# Patient Record
Sex: Male | Born: 1955 | Race: White | Hispanic: No | Smoking: Never smoker
Health system: Southern US, Community
[De-identification: ages and names within clinical notes are randomized; demographics above are authoritative.]

## PROBLEM LIST (undated history)

## (undated) DIAGNOSIS — F064 Anxiety disorder due to known physiological condition: Secondary | ICD-10-CM

## (undated) DIAGNOSIS — F5102 Adjustment insomnia: Secondary | ICD-10-CM

## (undated) DIAGNOSIS — F0630 Mood disorder due to known physiological condition, unspecified: Secondary | ICD-10-CM

## (undated) DIAGNOSIS — F0390 Unspecified dementia without behavioral disturbance: Secondary | ICD-10-CM

## (undated) DIAGNOSIS — F339 Major depressive disorder, recurrent, unspecified: Secondary | ICD-10-CM

## (undated) DIAGNOSIS — I6203 Nontraumatic chronic subdural hemorrhage: Secondary | ICD-10-CM

## (undated) DIAGNOSIS — G3109 Other frontotemporal dementia: Secondary | ICD-10-CM

## (undated) DIAGNOSIS — F0280 Dementia in other diseases classified elsewhere without behavioral disturbance: Secondary | ICD-10-CM

## (undated) DIAGNOSIS — I10 Essential (primary) hypertension: Secondary | ICD-10-CM

## (undated) HISTORY — DX: Essential (primary) hypertension: I10

---

## 2018-03-12 ENCOUNTER — Other Ambulatory Visit: Payer: Self-pay | Admitting: Internal Medicine

## 2018-03-12 DIAGNOSIS — R413 Other amnesia: Secondary | ICD-10-CM

## 2018-03-15 ENCOUNTER — Ambulatory Visit
Admission: RE | Admit: 2018-03-15 | Discharge: 2018-03-15 | Disposition: A | Discharge status: Home / Self Care | Payer: 59 | Source: Ambulatory Visit | Attending: Internal Medicine | Admitting: Internal Medicine

## 2018-03-15 DIAGNOSIS — R413 Other amnesia: Secondary | ICD-10-CM

## 2018-03-15 MED ORDER — GADOBENATE DIMEGLUMINE 529 MG/ML IV SOLN
20.0000 mL | Freq: Once | INTRAVENOUS | Status: AC | PRN
  Administered 2018-03-15: 20 mL via INTRAVENOUS

## 2018-03-18 ENCOUNTER — Encounter: Payer: Self-pay | Admitting: Psychology

## 2018-09-08 ENCOUNTER — Encounter

## 2018-09-08 ENCOUNTER — Encounter: Payer: 59 | Admitting: Psychology

## 2018-09-15 ENCOUNTER — Encounter: Payer: Self-pay | Admitting: Neurology

## 2018-10-08 ENCOUNTER — Encounter: Payer: 59 | Admitting: Psychology

## 2018-11-30 ENCOUNTER — Other Ambulatory Visit: Payer: Self-pay

## 2018-11-30 ENCOUNTER — Other Ambulatory Visit (INDEPENDENT_AMBULATORY_CARE_PROVIDER_SITE_OTHER): Payer: 59

## 2018-11-30 ENCOUNTER — Encounter: Payer: Self-pay | Admitting: Neurology

## 2018-11-30 ENCOUNTER — Ambulatory Visit (INDEPENDENT_AMBULATORY_CARE_PROVIDER_SITE_OTHER): Payer: 59 | Admitting: Neurology

## 2018-11-30 ENCOUNTER — Ambulatory Visit: Payer: 59 | Admitting: Neurology

## 2018-11-30 VITALS — BP 118/84 | HR 64 | Ht 74.0 in | Wt 189.0 lb

## 2018-11-30 DIAGNOSIS — G3109 Other frontotemporal dementia: Secondary | ICD-10-CM

## 2018-11-30 DIAGNOSIS — F0280 Dementia in other diseases classified elsewhere without behavioral disturbance: Secondary | ICD-10-CM

## 2018-11-30 MED ORDER — ESCITALOPRAM OXALATE 10 MG PO TABS: 10.0000 mg | ORAL_TABLET | Freq: Every day | ORAL | 11 refills | Status: DC

## 2018-11-30 NOTE — Patient Instructions (Addendum)
1. Bloodwork for TSH, RPR, ammonia, ESR, CRP, ANA, anti-thyroglobulin antibodies, anti-thyroid peroxidase antibodies  Your provider requests that you have LABS drawn today.  We share a lab with [facility] - they are located in [address] (second floor) of this building.  Once you get there, please have a seat and the phlebotomist will call your name.  If you have waited more than 15 minutes, please advise the front desk   2. Schedule lumbar puncture under fluoroscopy  We have sent a referral to [facility] for your LP Puncture and they will call you directly to schedule your appt. They are located at [place]. If you need to contact them directly please call [phone].   3. Start Lexapro 51m daily  4. Please have a driving evaluation done. Contact [employer] [place] [phone]  5. Follow-up in 6 months, call for any changes  FALL PRECAUTIONS: Be cautious when walking. Scan the area for obstacles that may increase the risk of trips and falls. When getting up in the mornings, sit up at the edge of the bed for a few minutes before getting out of bed. Consider elevating the bed at the head end to avoid drop of blood pressure when getting up. Walk always in a well-lit room (use night lights in the walls). Avoid area rugs or power cords from appliances in the middle of the walkways. Use a walker or a cane if necessary and consider physical therapy for balance exercise. Get your eyesight checked regularly.  FINANCIAL OVERSIGHT: Supervision, especially oversight when making financial decisions or transactions is also recommended.  HOME SAFETY: Consider the safety of the kitchen when operating appliances like stoves, microwave oven, and blender. Consider having supervision and share cooking responsibilities until no longer able to participate in those. Accidents with firearms and other hazards in the  house should be identified and addressed as well.  DRIVING: Regarding driving, in patients with progressive memory problems, driving will be impaired. We advise to have someone else do the driving if trouble finding directions or if minor accidents are reported. Independent driving assessment is available to determine safety of driving.  ABILITY TO BE LEFT ALONE: If patient is unable to contact 911 operator, consider using LifeLine, or when the need is there, arrange for someone to stay with patients. Smoking is a fire hazard, consider supervision or cessation. Risk of wandering should be assessed by caregiver and if detected at any point, supervision and safe proof recommendations should be instituted.  MEDICATION SUPERVISION: Inability to self-administer medication needs to be constantly addressed. Implement a mechanism to ensure safe administration of the medications.  RECOMMENDATIONS FOR ALL PATIENTS WITH MEMORY PROBLEMS: 1. Continue to exercise (Recommend 30 minutes of walking everyday, or 3 hours every week) 2. Increase social interactions - continue going to [place] enjoy social gatherings with friends and family 3. Eat healthy, avoid fried foods and eat more fruits and vegetables 4. Maintain adequate blood pressure, blood sugar, and blood cholesterol level. Reducing the risk of stroke and cardiovascular disease also helps promoting better memory. 5. Avoid stressful situations. Live a simple life and avoid aggravations. Organize your time and prepare for the next day in anticipation. 6. Sleep well, avoid any interruptions of sleep and avoid any distractions in the bedroom that may interfere with adequate sleep quality 7. Avoid sugar, avoid sweets as there is a strong link between excessive sugar intake, diabetes, and cognitive impairment The Mediterranean  diet has been shown to help patients reduce the risk of progressive memory disorders and reduces cardiovascular risk. This includes  eating fish, eat fruits and green leafy vegetables, nuts like almonds and hazelnuts, walnuts, and also use olive oil. Avoid fast foods and fried foods as much as possible. Avoid sweets and sugar as sugar use has been linked to worsening of memory function.  There is always a concern of gradual progression of memory problems. If this is the case, then we may need to adjust level of care according to patient needs. Support, both to the patient and caregiver, should then be put into place.
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
## 2018-12-01 LAB — THYROID PEROXIDASE ANTIBODY: Thyroperoxidase Ab SerPl-aCnc: <1 IU/mL (ref <9)

## 2018-12-01 LAB — THYROGLOBULIN LEVEL: Thyroglobulin: 11.5 ng/mL

## 2018-12-01 LAB — C-REACTIVE PROTEIN: CRP: 2 mg/L (ref <8.0)

## 2018-12-01 LAB — SEDIMENTATION RATE: Sed Rate: 2 mm/h (ref 0–20)

## 2018-12-01 LAB — RPR: RPR Ser Ql: NONREACTIVE

## 2018-12-01 LAB — ANA: Anti Nuclear Antibody(ANA): NEGATIVE

## 2018-12-01 LAB — AMMONIA: Ammonia: 36 umol/L (ref <=72)

## 2018-12-01 LAB — TSH: TSH: 1.84 mIU/L (ref 0.40–4.50)

## 2018-12-02 ENCOUNTER — Encounter: Payer: Self-pay | Admitting: Neurology

## 2018-12-09 ENCOUNTER — Telehealth: Payer: Self-pay | Admitting: Neurology

## 2018-12-09 DIAGNOSIS — G3109 Other frontotemporal dementia: Principal | ICD-10-CM

## 2018-12-09 DIAGNOSIS — F0280 Dementia in other diseases classified elsewhere without behavioral disturbance: Secondary | ICD-10-CM

## 2018-12-09 NOTE — Addendum Note (Signed)
Addended bySilvio Pate on: 12/09/2018 04:10 PM   Modules accepted: Orders

## 2018-12-09 NOTE — Telephone Encounter (Signed)
Pls order LP under fluoro:  1. CSF cell count  2. CSF protein and glucose  3. CSF gram stain and culture  4. CSF HSV PCR  5. CSF cryptococcal Ag  6. CSF fungal culture  7. Cytology  8. CSF ACE level 9. CSF oligoclonal bands, IgG synthesis

## 2018-12-09 NOTE — Telephone Encounter (Signed)
Wife is calling in about the spinal tap orders. Please call her back at [phone]. Thanks!

## 2018-12-09 NOTE — Telephone Encounter (Signed)
Documented in other encounter

## 2018-12-09 NOTE — Telephone Encounter (Signed)
-----   Message from Van Clines, MD sent at 12/02/2018  9:20 AM EST ----- Pls left wife know bloodwork was normal. Proceed with lumbar puncture as discussed. I will send you CSF studies for lab. Thanks  Patient's wife made aware.  Lumbar puncture orders entered. [facility] and they are aware. They will call patient. [facility])

## 2018-12-09 NOTE — Telephone Encounter (Signed)
I don't see that you have sent me any tests to be done.

## 2018-12-09 NOTE — Telephone Encounter (Signed)
-----   Message from Van Clines, MD sent at 12/02/2018  9:20 AM EST ----- Pls left wife know bloodwork was normal. Proceed with lumbar puncture as discussed. I will send you CSF studies for lab. Thanks

## 2018-12-21 ENCOUNTER — Other Ambulatory Visit (HOSPITAL_COMMUNITY)
Admission: RE | Admit: 2018-12-21 | Discharge: 2018-12-21 | Disposition: A | Discharge status: Home / Self Care | Payer: 59 | Source: Ambulatory Visit | Attending: Neurology | Admitting: Neurology

## 2018-12-21 ENCOUNTER — Ambulatory Visit
Admission: RE | Admit: 2018-12-21 | Discharge: 2018-12-21 | Disposition: A | Discharge status: Home / Self Care | Payer: 59 | Source: Ambulatory Visit | Attending: Neurology | Admitting: Neurology

## 2018-12-21 ENCOUNTER — Other Ambulatory Visit: Payer: Self-pay

## 2018-12-21 VITALS — BP 116/60 | HR 58

## 2018-12-21 DIAGNOSIS — G3109 Other frontotemporal dementia: Secondary | ICD-10-CM | POA: Insufficient documentation

## 2018-12-21 DIAGNOSIS — F0280 Dementia in other diseases classified elsewhere without behavioral disturbance: Secondary | ICD-10-CM | POA: Diagnosis present

## 2018-12-21 NOTE — Progress Notes (Signed)
Labs for LP obtained from pt's R AC. Pt tolerated procedure well. Site is unremarkable.

## 2018-12-21 NOTE — Discharge Instructions (Signed)

## 2018-12-22 ENCOUNTER — Other Ambulatory Visit: Payer: Self-pay | Admitting: Neurology

## 2018-12-24 ENCOUNTER — Telehealth: Payer: Self-pay | Admitting: Neurology

## 2018-12-24 DIAGNOSIS — G971 Other reaction to spinal and lumbar puncture: Secondary | ICD-10-CM

## 2018-12-24 NOTE — Telephone Encounter (Signed)
Orders placed.

## 2018-12-24 NOTE — Telephone Encounter (Signed)
Spoke with pt's wife, Tyler Aas.  She explains that pt has had headache ever since LP on Monday and is now experiencing projectile vomiting.    Called [facility] about Blood Patch, spoke with [employer].  She states that as of right now they are unsure what procedures that can and cannot preform (COVID19 Pandemic) advised for me to place order in system and she will call my direct line before the end of the day with response from supervisor.

## 2018-12-24 NOTE — Telephone Encounter (Signed)
Pt wife called and states that the patient had a lumbar puncture on Monday and since then has been dealing with headaches and not being able to keep things down. She would like to speak to someone about what she needs to do. Please call

## 2018-12-25 ENCOUNTER — Other Ambulatory Visit: Payer: Self-pay

## 2018-12-25 ENCOUNTER — Ambulatory Visit
Admission: RE | Admit: 2018-12-25 | Discharge: 2018-12-25 | Disposition: A | Discharge status: Home / Self Care | Payer: 59 | Source: Ambulatory Visit | Attending: Neurology | Admitting: Neurology

## 2018-12-25 DIAGNOSIS — G971 Other reaction to spinal and lumbar puncture: Secondary | ICD-10-CM

## 2018-12-25 MED ORDER — IOPAMIDOL (ISOVUE-M 200) INJECTION 41%
1.0000 mL | Freq: Once | INTRAMUSCULAR | Status: AC
  Administered 2018-12-25: 1 mL via EPIDURAL

## 2018-12-25 NOTE — Discharge Instructions (Signed)

## 2018-12-25 NOTE — Progress Notes (Signed)
Blood obtained for blood patch from L AC. Pt tolerated procedure well. Site is unremarkable.  

## 2019-01-01 LAB — CSF CELL COUNT WITH DIFFERENTIAL
RBC Count, CSF: 0 cells/uL (ref 0–10)
WBC, CSF: 0 cells/uL (ref 0–5)

## 2019-01-19 ENCOUNTER — Telehealth: Payer: Self-pay

## 2019-01-19 LAB — CRYPTOCOCCAL AG, LTX SCR RFLX TITER
Cryptococcal Ag Screen: NOT DETECTED
MICRO NUMBER:: 323047
SPECIMEN QUALITY:: ADEQUATE

## 2019-01-19 LAB — PROTEIN, CSF: Total Protein, CSF: 68 mg/dL — ABNORMAL HIGH (ref 15–60)

## 2019-01-19 LAB — CNS IGG SYNTHESIS RATE, CSF+BLOOD
Albumin Serum: 4.4 g/dL (ref 3.2–4.6)
Albumin, CSF: 43.2 mg/dL — ABNORMAL HIGH (ref 8.0–42.0)
CNS-IgG Synthesis Rate: -0.1 mg/24 h (ref <=3.3)
IgG (Immunoglobin G), Serum: 676 mg/dL (ref 600–1540)
IgG Total CSF: 3.4 mg/dL (ref 0.8–7.7)
IgG-Index: 0.51 (ref <0.66)

## 2019-01-19 LAB — ANGIOTENSIN CONVERTING ENZYME, CSF: ACE, CSF: 4 U/L (ref <=15)

## 2019-01-19 LAB — FUNGUS CULTURE W SMEAR
MICRO NUMBER:: 323048
SMEAR:: NONE SEEN
SPECIMEN QUALITY:: ADEQUATE

## 2019-01-19 LAB — CSF CULTURE W GRAM STAIN
MICRO NUMBER:: 323049
Result:: NO GROWTH
SPECIMEN QUALITY:: ADEQUATE

## 2019-01-19 LAB — HSV DNA BY PCR (REFERENCE LAB)
HSV 1 DNA: NOT DETECTED
HSV 2 DNA: NOT DETECTED

## 2019-01-19 LAB — OLIGOCLONAL BANDS, CSF + SERM

## 2019-01-19 LAB — CSF CULTURE

## 2019-01-19 LAB — GLUCOSE, CSF: Glucose, CSF: 61 mg/dL (ref 40–80)

## 2019-01-19 NOTE — Telephone Encounter (Signed)
-----   Message from Van Clines, MD sent at 12/31/2018 10:58 AM EDT ----- Pls let wife know all the results from his spinal tap finally came back and there is no evidence of inflammation or infection. Thanks

## 2019-01-19 NOTE — Telephone Encounter (Signed)
LMOM for pt's wife, Tyler Aas, relaying message below.

## 2019-04-07 ENCOUNTER — Telehealth: Payer: Self-pay | Admitting: Neurology

## 2019-04-07 NOTE — Telephone Encounter (Signed)
Error

## 2019-05-20 ENCOUNTER — Telehealth: Payer: Self-pay | Admitting: Neurology

## 2019-05-20 NOTE — Telephone Encounter (Signed)
Patient's wife is calling about if patient needs any labs or testing in the mean time before appt in March since that was next available. Thanks

## 2019-05-21 NOTE — Telephone Encounter (Signed)
Dr. Delice Lesch,  Does pt need any labs before his visit? He was seen 11/30/18 for dementia and you did want him to f/u in 78m. Last labs were on 12/21/18

## 2019-05-21 NOTE — Telephone Encounter (Signed)
That is too far away, he needs an earlier visit. Pls let wife know I have an opening for a virtual video visit on Aug 25 at 10am, if she can do that? Thanks

## 2019-05-21 NOTE — Telephone Encounter (Signed)
Dr. Delice Lesch,  Where do you want this pt scheduled?

## 2019-05-21 NOTE — Telephone Encounter (Signed)
Please call pt/wife and schedule virtual for [place]. Thank you.

## 2019-05-21 NOTE — Telephone Encounter (Signed)
Wife doesn't not want him doing a VV as she feels it isn't thorough enough.

## 2019-05-23 NOTE — Telephone Encounter (Signed)
I have an opening on Aug 31 at 1:30pm, thanks

## 2019-05-24 NOTE — Telephone Encounter (Signed)
Left message informing of appt date and time. Instructed to call back if date/time does not work.

## 2019-05-24 NOTE — Telephone Encounter (Signed)
error 

## 2019-06-01 ENCOUNTER — Ambulatory Visit: Payer: 59 | Admitting: Neurology

## 2019-06-07 ENCOUNTER — Ambulatory Visit: Payer: 59 | Admitting: Neurology

## 2019-06-28 ENCOUNTER — Ambulatory Visit: Payer: 59 | Admitting: Neurology

## 2019-07-12 ENCOUNTER — Ambulatory Visit: Payer: 59 | Admitting: Neurology

## 2019-12-15 ENCOUNTER — Encounter: Payer: Self-pay | Admitting: Neurology

## 2019-12-15 ENCOUNTER — Ambulatory Visit (INDEPENDENT_AMBULATORY_CARE_PROVIDER_SITE_OTHER): Payer: Self-pay | Admitting: Neurology

## 2019-12-15 ENCOUNTER — Other Ambulatory Visit: Payer: Self-pay

## 2019-12-15 VITALS — BP 138/86 | HR 67 | Ht 74.0 in | Wt 194.8 lb

## 2019-12-15 DIAGNOSIS — G3109 Other frontotemporal dementia: Secondary | ICD-10-CM

## 2019-12-15 DIAGNOSIS — F0280 Dementia in other diseases classified elsewhere without behavioral disturbance: Secondary | ICD-10-CM

## 2019-12-15 MED ORDER — ESCITALOPRAM OXALATE 20 MG PO TABS: 20.0000 mg | ORAL_TABLET | Freq: Every day | ORAL | 3 refills | Status: DC
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
## 2019-12-15 NOTE — Patient Instructions (Signed)
1. Prescription for Lexapro 20mg  daily has been sent to your pharmacy  2. Doris should give you your medications daily now  3. Follow-up in 6 months, call for any changes

## 2019-12-29 ENCOUNTER — Other Ambulatory Visit: Payer: Self-pay | Admitting: Neurology

## 2020-01-06 ENCOUNTER — Ambulatory Visit: Payer: Self-pay | Attending: Internal Medicine

## 2020-01-06 DIAGNOSIS — Z23 Encounter for immunization: Secondary | ICD-10-CM

## 2020-01-06 NOTE — Progress Notes (Signed)
   Covid-19 Vaccination Clinic  Name:  Micheal [name]    MRN: [id number] DOB: [birth year]  01/06/2020  Micheal [name] was observed post Covid-19 immunization for 15 minutes without incident. He was provided with Vaccine Information Sheet and instruction to access the V-Safe system.   Micheal [name] was instructed to call 911 with any severe reactions post vaccine: Marland Kitchen Difficulty breathing  . Swelling of face and throat  . A fast heartbeat  . A bad rash all over body  . Dizziness and weakness   Immunizations Administered    Name Date Dose VIS Date Route   Pfizer COVID-19 Vaccine 01/06/2020  8:40 AM 0.3 mL 09/17/2019 Intramuscular   Manufacturer: [employer], [employer]   Lot: [id number]   NDC: [address]-2

## 2020-01-31 ENCOUNTER — Ambulatory Visit: Payer: Self-pay | Attending: Internal Medicine

## 2020-01-31 DIAGNOSIS — Z23 Encounter for immunization: Secondary | ICD-10-CM

## 2020-01-31 NOTE — Progress Notes (Signed)
   Covid-19 Vaccination Clinic  Name:  Micheal [name]    MRN: [id number] DOB: [birth year]  01/31/2020  [name] was observed post Covid-19 immunization for 15 minutes without incident. He was provided with Vaccine Information Sheet and instruction to access the V-Safe system.   [name] was instructed to call 911 with any severe reactions post vaccine: Marland Kitchen Difficulty breathing  . Swelling of face and throat  . A fast heartbeat  . A bad rash all over body  . Dizziness and weakness   Immunizations Administered    Name Date Dose VIS Date Route   Pfizer COVID-19 Vaccine 01/31/2020  8:42 AM 0.3 mL 12/01/2018 Intramuscular   Manufacturer: [employer], [employer]   Lot: [id number]   NDC: [address]-7

## 2020-01-31 NOTE — Progress Notes (Signed)
   Covid-19 Vaccination Clinic  Name:  Micheal [name]    MRN: [id number] DOB: 26-Sep-1956  01/31/2020  [name] was observed post Covid-19 immunization for 15 minutes without incident. He was provided with Vaccine Information Sheet and instruction to access the V-Safe system.   [name] was instructed to call 911 with any severe reactions post vaccine: Marland Kitchen Difficulty breathing  . Swelling of face and throat  . A fast heartbeat  . A bad rash all over body  . Dizziness and weakness   Immunizations Administered    Name Date Dose VIS Date Route   Pfizer COVID-19 Vaccine 01/31/2020  8:42 AM 0.3 mL 12/01/2018 Intramuscular   Manufacturer: [employer], [employer]   Lot: [id number]   NDC: [address]-6      Covid-19 Vaccination Clinic  Name:  Micheal [name]    MRN: [id number] DOB: [birth year]  01/31/2020  [name] was observed post Covid-19 immunization for 15 minutes without incident. He was provided with Vaccine Information Sheet and instruction to access the V-Safe system.   [name] was instructed to call 911 with any severe reactions post vaccine: Marland Kitchen Difficulty breathing  . Swelling of face and throat  . A fast heartbeat  . A bad rash all over body  . Dizziness and weakness

## 2020-02-28 ENCOUNTER — Other Ambulatory Visit: Payer: Self-pay

## 2020-02-28 ENCOUNTER — Telehealth: Payer: Self-pay | Admitting: Neurology

## 2020-02-28 MED ORDER — QUETIAPINE FUMARATE 25 MG PO TABS: 25.0000 mg | ORAL_TABLET | Freq: Every day | ORAL | 0 refills | Status: DC

## 2020-02-28 NOTE — Telephone Encounter (Signed)
Pt wife called back no answer voice mail left for her to call the office back

## 2020-02-28 NOTE — Telephone Encounter (Signed)
Patient called again asking to speak with a nurse.

## 2020-02-28 NOTE — Telephone Encounter (Signed)
If there is sudden worsening, would need to make sure there is no infection such as a UTI that is making him even more confused. Or if he is not sleeping well. She will need to take over his medications and lock them up so he does not take them. We had talked about getting a home health nurse to help with medications, if this will make it easier for her. Another option which may be harder is to start looking into Memory Care facilities if he is getting harder to manage at home. Thanks

## 2020-02-28 NOTE — Telephone Encounter (Signed)
Spoke to pt wife she stated that pt is walking around with pants on backward and that he tried to put some on his head and got hot, went to the fridge opened the door to cool off and got weshire sauce and is drinking it. Pt wife was advised to take pt to the ER she stated that they do not have insurance asking for direct admit pt wife informed our dr do not direct admit, pt was started on seroquel 25mg  called into pharmacy on file, again pt wife advised if he get worse to call 911 or take him to the ER.

## 2020-02-28 NOTE — Telephone Encounter (Signed)
Patient called in and wanted to hold for [facility]. Herbert Seta was not at her desk. Patient wants a call back at [phone]

## 2020-02-28 NOTE — Telephone Encounter (Signed)
Pt wife called back no answer voice mail left for her to call back

## 2020-02-28 NOTE — Telephone Encounter (Signed)
Patient's wife called again to report the patient "drank all of his glaucoma medication this morning." She said she reached out to the PCP, Dr. Kirby Funk, for help with this. She said, "There is no concern for toxicity with the glaucoma medication but it may make the blood pressure in his eye higher. I may need to take him to the emergency department."  She further explained, "The reason I called requesting to speak with a nurse of Dr. Karel Jarvis today is because my husband has gotten so much worse with his dementia recently."

## 2020-02-28 NOTE — Telephone Encounter (Signed)
Spoke to pt wife she stated that he does not have medicaid or medicare to pay for nurse to come out. She stated that she can not pay $5,000 a month for memory care. Pt wife stated that pt sleeps in a chair but sleeps for the most part all night. She also said that he has not be checked for UTI she knows he does not have one because he has no S/S of one "no smell, dis coloration, no pain, no trouble going."  When talking to pt wife going over what Dr.Aquino has suggested and recommend pt wife wanted to know if I was a nurse or a CMA? I told her I was a Engineer, civil (consulting), she stated that she was too

## 2020-02-28 NOTE — Telephone Encounter (Signed)
Patient's wife called to report the patient has not been taking his medication routinely for the last week. She said, "He just sits and plays with it. He won't take his medication and has only had it sporadically for the last 5 days. I'm not sure what to do."

## 2020-02-28 NOTE — Telephone Encounter (Signed)
Called pt wife back no answer voice mail left for pt to call back

## 2020-03-01 ENCOUNTER — Inpatient Hospital Stay (HOSPITAL_COMMUNITY)
Admission: EM | Admit: 2020-03-01 | Discharge: 2020-03-10 | DRG: 056 | Disposition: A | Discharge status: Skilled Nursing Facility | Payer: Self-pay | Source: Ambulatory Visit | Attending: Internal Medicine | Admitting: Internal Medicine

## 2020-03-01 ENCOUNTER — Encounter (HOSPITAL_COMMUNITY): Payer: Self-pay | Admitting: Internal Medicine

## 2020-03-01 ENCOUNTER — Emergency Department (HOSPITAL_COMMUNITY): Payer: Self-pay

## 2020-03-01 DIAGNOSIS — Z820 Family history of epilepsy and other diseases of the nervous system: Secondary | ICD-10-CM

## 2020-03-01 DIAGNOSIS — R4182 Altered mental status, unspecified: Secondary | ICD-10-CM

## 2020-03-01 DIAGNOSIS — I10 Essential (primary) hypertension: Secondary | ICD-10-CM | POA: Diagnosis present

## 2020-03-01 DIAGNOSIS — Z79899 Other long term (current) drug therapy: Secondary | ICD-10-CM

## 2020-03-01 DIAGNOSIS — E860 Dehydration: Secondary | ICD-10-CM | POA: Diagnosis present

## 2020-03-01 DIAGNOSIS — G9341 Metabolic encephalopathy: Secondary | ICD-10-CM | POA: Diagnosis present

## 2020-03-01 DIAGNOSIS — R7989 Other specified abnormal findings of blood chemistry: Secondary | ICD-10-CM | POA: Diagnosis present

## 2020-03-01 DIAGNOSIS — F0281 Dementia in other diseases classified elsewhere with behavioral disturbance: Secondary | ICD-10-CM | POA: Diagnosis present

## 2020-03-01 DIAGNOSIS — F02818 Dementia in other diseases classified elsewhere, unspecified severity, with other behavioral disturbance: Secondary | ICD-10-CM | POA: Diagnosis present

## 2020-03-01 DIAGNOSIS — Z20822 Contact with and (suspected) exposure to covid-19: Secondary | ICD-10-CM | POA: Diagnosis present

## 2020-03-01 DIAGNOSIS — E870 Hyperosmolality and hypernatremia: Secondary | ICD-10-CM

## 2020-03-01 DIAGNOSIS — E876 Hypokalemia: Secondary | ICD-10-CM | POA: Diagnosis present

## 2020-03-01 DIAGNOSIS — Z66 Do not resuscitate: Secondary | ICD-10-CM | POA: Diagnosis present

## 2020-03-01 DIAGNOSIS — Z111 Encounter for screening for respiratory tuberculosis: Secondary | ICD-10-CM

## 2020-03-01 DIAGNOSIS — G3109 Other frontotemporal dementia: Principal | ICD-10-CM | POA: Diagnosis present

## 2020-03-01 LAB — AMMONIA: Ammonia: 46 umol/L — ABNORMAL HIGH (ref 9–35)

## 2020-03-01 LAB — COMPREHENSIVE METABOLIC PANEL
ALT: 52 U/L — ABNORMAL HIGH (ref 0–44)
AST: 42 U/L — ABNORMAL HIGH (ref 15–41)
Albumin: 4 g/dL (ref 3.5–5.0)
Alkaline Phosphatase: 48 U/L (ref 38–126)
Anion gap: 12 (ref 5–15)
BUN: 42 mg/dL — ABNORMAL HIGH (ref 8–23)
CO2: 25 mmol/L (ref 22–32)
Calcium: 9.4 mg/dL (ref 8.9–10.3)
Chloride: 119 mmol/L — ABNORMAL HIGH (ref 98–111)
Creatinine, Ser: 1.14 mg/dL (ref 0.61–1.24)
GFR calc Af Amer: >60 mL/min (ref >60)
GFR calc non Af Amer: >60 mL/min (ref >60)
Glucose, Bld: 148 mg/dL — ABNORMAL HIGH (ref 70–99)
Potassium: 3.5 mmol/L (ref 3.5–5.1)
Sodium: 156 mmol/L — ABNORMAL HIGH (ref 135–145)
Total Bilirubin: 0.5 mg/dL (ref 0.3–1.2)
Total Protein: 6 g/dL — ABNORMAL LOW (ref 6.5–8.1)

## 2020-03-01 LAB — CBC WITH DIFFERENTIAL/PLATELET
Abs Immature Granulocytes: 0.05 10*3/uL (ref 0.00–0.07)
Basophils Absolute: 0 10*3/uL (ref 0.0–0.1)
Basophils Relative: 0 %
Eosinophils Absolute: 0 10*3/uL (ref 0.0–0.5)
Eosinophils Relative: 0 %
HCT: 45 % (ref 39.0–52.0)
Hemoglobin: 14.3 g/dL (ref 13.0–17.0)
Immature Granulocytes: 1 %
Lymphocytes Relative: 22 %
Lymphs Abs: 2.4 10*3/uL (ref 0.7–4.0)
MCH: 33.6 pg (ref 26.0–34.0)
MCHC: 31.8 g/dL (ref 30.0–36.0)
MCV: 105.9 fL — ABNORMAL HIGH (ref 80.0–100.0)
Monocytes Absolute: 0.8 10*3/uL (ref 0.1–1.0)
Monocytes Relative: 8 %
Neutro Abs: 7.7 10*3/uL (ref 1.7–7.7)
Neutrophils Relative %: 69 %
Platelets: 131 10*3/uL — ABNORMAL LOW (ref 150–400)
RBC: 4.25 MIL/uL (ref 4.22–5.81)
RDW: 13 % (ref 11.5–15.5)
WBC: 11 10*3/uL — ABNORMAL HIGH (ref 4.0–10.5)
nRBC: 0 % (ref 0.0–0.2)

## 2020-03-01 LAB — BASIC METABOLIC PANEL
Anion gap: 12 (ref 5–15)
BUN: 41 mg/dL — ABNORMAL HIGH (ref 8–23)
CO2: 25 mmol/L (ref 22–32)
Calcium: 9.3 mg/dL (ref 8.9–10.3)
Chloride: 118 mmol/L — ABNORMAL HIGH (ref 98–111)
Creatinine, Ser: 0.95 mg/dL (ref 0.61–1.24)
GFR calc Af Amer: >60 mL/min (ref >60)
GFR calc non Af Amer: >60 mL/min (ref >60)
Glucose, Bld: 122 mg/dL — ABNORMAL HIGH (ref 70–99)
Potassium: 3.4 mmol/L — ABNORMAL LOW (ref 3.5–5.1)
Sodium: 155 mmol/L — ABNORMAL HIGH (ref 135–145)

## 2020-03-01 LAB — SARS CORONAVIRUS 2 BY RT PCR (HOSPITAL ORDER, PERFORMED IN ~~LOC~~ HOSPITAL LAB): SARS Coronavirus 2: NEGATIVE

## 2020-03-01 LAB — URINALYSIS, ROUTINE W REFLEX MICROSCOPIC
Bilirubin Urine: NEGATIVE
Glucose, UA: NEGATIVE mg/dL
Ketones, ur: NEGATIVE mg/dL
Leukocytes,Ua: NEGATIVE
Nitrite: NEGATIVE
Protein, ur: 30 mg/dL — AB
Specific Gravity, Urine: 1.035 — ABNORMAL HIGH (ref 1.005–1.030)
pH: 5 (ref 5.0–8.0)

## 2020-03-01 LAB — VITAMIN B12: Vitamin B-12: 430 pg/mL (ref 180–914)

## 2020-03-01 LAB — RAPID URINE DRUG SCREEN, HOSP PERFORMED
Amphetamines: NOT DETECTED
Barbiturates: NOT DETECTED
Benzodiazepines: NOT DETECTED
Cocaine: NOT DETECTED
Opiates: NOT DETECTED
Tetrahydrocannabinol: NOT DETECTED

## 2020-03-01 LAB — HIV ANTIBODY (ROUTINE TESTING W REFLEX): HIV Screen 4th Generation wRfx: NONREACTIVE

## 2020-03-01 LAB — TSH: TSH: 1.295 u[IU]/mL (ref 0.350–4.500)

## 2020-03-01 LAB — FOLATE: Folate: 18.4 ng/mL (ref >5.9)

## 2020-03-01 MED ORDER — BRIMONIDINE TARTRATE 0.2 % OP SOLN
1.0000 [drp] | Freq: Three times a day (TID) | OPHTHALMIC | Status: DC
  Administered 2020-03-02 – 2020-03-10 (×26): 1 [drp] via OPHTHALMIC
  Filled 2020-03-01: qty 5

## 2020-03-01 MED ORDER — HALOPERIDOL LACTATE 5 MG/ML IJ SOLN: 2.0000 mg | Freq: Four times a day (QID) | INTRAMUSCULAR | Status: DC | PRN

## 2020-03-01 MED ORDER — SODIUM CHLORIDE 0.9 % IV BOLUS: 1000.0000 mL | Freq: Once | INTRAVENOUS | Status: DC

## 2020-03-01 MED ORDER — QUETIAPINE FUMARATE 50 MG PO TABS
50.0000 mg | ORAL_TABLET | Freq: Every day | ORAL | Status: DC
  Administered 2020-03-01 – 2020-03-09 (×9): 50 mg via ORAL
  Filled 2020-03-01 (×9): qty 1

## 2020-03-01 MED ORDER — DEXTROSE 5 % IV SOLN: INTRAVENOUS | Status: DC

## 2020-03-01 MED ORDER — ENOXAPARIN SODIUM 40 MG/0.4ML ~~LOC~~ SOLN
40.0000 mg | SUBCUTANEOUS | Status: DC
  Administered 2020-03-02 – 2020-03-10 (×9): 40 mg via SUBCUTANEOUS
  Filled 2020-03-01 (×9): qty 0.4

## 2020-03-01 MED ORDER — ENALAPRILAT 1.25 MG/ML IV SOLN
1.2500 mg | Freq: Four times a day (QID) | INTRAVENOUS | Status: DC | PRN
  Filled 2020-03-01: qty 1

## 2020-03-01 MED ORDER — HALOPERIDOL LACTATE 5 MG/ML IJ SOLN
10.0000 mg | Freq: Once | INTRAMUSCULAR | Status: AC
  Administered 2020-03-01: 10 mg via INTRAMUSCULAR
  Filled 2020-03-01: qty 2

## 2020-03-01 MED ORDER — POLYETHYLENE GLYCOL 3350 17 G PO PACK: 17.0000 g | PACK | Freq: Every day | ORAL | Status: DC | PRN

## 2020-03-01 MED ORDER — ESCITALOPRAM OXALATE 10 MG PO TABS
20.0000 mg | ORAL_TABLET | Freq: Every day | ORAL | Status: DC
  Administered 2020-03-01 – 2020-03-03 (×3): 20 mg via ORAL
  Filled 2020-03-01 (×3): qty 2

## 2020-03-01 MED ORDER — QUETIAPINE FUMARATE 25 MG PO TABS: 25.0000 mg | ORAL_TABLET | Freq: Every day | ORAL | Status: DC

## 2020-03-01 MED ORDER — ACETAMINOPHEN 325 MG PO TABS: 650.0000 mg | ORAL_TABLET | Freq: Four times a day (QID) | ORAL | Status: DC | PRN

## 2020-03-01 MED ORDER — PROCHLORPERAZINE EDISYLATE 10 MG/2ML IJ SOLN: 10.0000 mg | Freq: Four times a day (QID) | INTRAMUSCULAR | Status: DC | PRN

## 2020-03-01 MED ORDER — ACETAMINOPHEN 650 MG RE SUPP: 650.0000 mg | Freq: Four times a day (QID) | RECTAL | Status: DC | PRN

## 2020-03-01 MED ORDER — HALOPERIDOL LACTATE 5 MG/ML IJ SOLN
5.0000 mg | Freq: Once | INTRAMUSCULAR | Status: AC
  Administered 2020-03-01: 5 mg via INTRAMUSCULAR
  Filled 2020-03-01: qty 1

## 2020-03-01 NOTE — ED Notes (Signed)
Tele Dinner ordered 

## 2020-03-01 NOTE — ED Notes (Signed)
Patient in room, sitting comfortably by sitter. Attempted to encourage patient to use the urinal, unsuccessful.

## 2020-03-01 NOTE — ED Notes (Signed)
Pt continues to not follow commands and is uncooperative.. MD notified

## 2020-03-01 NOTE — H&P (Addendum)
History and Physical    Micheal [name] DGU:[id number] DOB: 03-Apr-1956 DOA: 03/01/2020  PCP: Kirby Funk, MD  Patient coming from: Home   Chief Complaint:  Chief Complaint  Patient presents with  . Altered Mental Status     HPI:    64 year old male with past medical history of frontotemporal dementia (diagnosed by and follows with Dr. Karel Jarvis), hypertension who presents to [facility] emergency department with a several week history of progressively worsening agitation and confusion.  Patient is an extremely poor historian due to confusion and bouts of extreme agitation and therefore the majority the history is obtained from the wife via phone conversation.  Patient's wife explains that for the past 2 to 3 weeks the patient has had a significant decline compared to his baseline level of functioning.  For at least the past 2 weeks the patient has been extremely agitated, exhibiting extremely erratic behavior such as wearing his pants on his head, pacing around the house, pushing his spouse at times and fits of agitation, and consuming odd items including drinking entire bottles of [place] sauce and drinking his glaucoma eyedrops.  Patient's wife denies any recent fevers, cough, shortness of breath, nausea, vomiting or diarrhea.  The wife denies hearing the patient complain of pain as of late.  Patient did get both doses of his Covid vaccine in April 2021.  The patient spouse reached out to Dr. Rosalyn Gess office and was advised to bring the patient to the emergency department for work-up to determine there is no superimposed cause of encephalopathy.  Upon evaluation in the emergency department patient has been found to be extremely confused and agitated.  Patient is also been found to be suffering from substantial hypernatremia of 156 with mild leukocytosis of 11.  Patient was administered 5 mg of Haldol IM without effect.  Imaging of the head was performed and was unremarkable for  any acute disease.  The hospitalist group was then called to assess patient for admission the hospital.  At baseline, while the patient is typically confused and intermittently agitated he is usually relatively pleasant and able to converse.  At baseline patient is forgetful even of his children's names and is not oriented.   Review of Systems: Unable to perform due to extreme confusion and agitation.  Past Medical History:  Diagnosis Date  . Hypertension     History reviewed. No pertinent surgical history.   reports that he has never smoked. He has never used smokeless tobacco. He reports that he does not drink alcohol or use drugs.  No Known Allergies  Family History  Problem Relation Age of Onset  . Dementia Father   . Parkinsonism Father   . Dementia Paternal Aunt   . Dementia Paternal Uncle   . Dementia Paternal Grandfather      Prior to Admission medications   Medication Sig Start Date End Date Taking? Authorizing Provider  atenolol-chlorthalidone (TENORETIC) 50-25 MG tablet Take 1 tablet by mouth daily.    [provider]  brimonidine (ALPHAGAN) 0.2 % ophthalmic solution 3 (three) times daily.    [provider]  dorzolamide (TRUSOPT) 2 % ophthalmic solution 1 drop 3 (three) times daily.    [provider]  escitalopram (LEXAPRO) 20 MG tablet Take 1 tablet (20 mg total) by mouth daily. 12/15/19   Van Clines, MD  QUEtiapine (SEROQUEL) 25 MG tablet Take 1 tablet (25 mg total) by mouth at bedtime. 02/28/20   Van Clines, MD  timolol (TIMOPTIC) 0.5 %  ophthalmic solution 1 drop 2 (two) times daily.    [provider]    Physical Exam: Vitals:   03/01/20 0247 03/01/20 0300 03/01/20 0315 03/01/20 0529  BP:  137/83 129/83 (!) 138/117  Pulse:  86 82 78  Resp:  (!) 23 (!) 24 16  Temp:      TempSrc:      SpO2:  96% 95% 98%  Weight: 86.2 kg     Height: 6' (1.829 m)       Constitutional: Awake, alert, disoriented and extremely  confused.  Patient is in acute distress due to extreme agitation. Skin: no rashes, no lesions Eyes: Refused ENMT: Refused Neck: Refused Respiratory: Refused Cardiovascular:  Refused Chest:    Refused Back:    Refused Abdomen:  Refused   Musculoskeletal:  Refused Neurologic:  Refused Psychiatric: Patient is extremely agitated with labile affect.  Patient currently does not seem to possess insight as to his current situation.  Patient's speech is hurried and tangential.  Additionally, patient seems to be responding to internal stimuli.   Labs on Admission: I have personally reviewed following labs and imaging studies -   CBC: Recent Labs  Lab 03/01/20 0350  WBC 11.0*  NEUTROABS 7.7  HGB 14.3  HCT 45.0  MCV 105.9*  PLT 131*   Basic Metabolic Panel: Recent Labs  Lab 03/01/20 0350  NA 156*  K 3.5  CL 119*  CO2 25  GLUCOSE 148*  BUN 42*  CREATININE 1.14  CALCIUM 9.4   GFR: Estimated Creatinine Clearance: 72.8 mL/min (by C-G formula based on SCr of 1.14 mg/dL). Liver Function Tests: Recent Labs  Lab 03/01/20 0350  AST 42*  ALT 52*  ALKPHOS 48  BILITOT 0.5  PROT 6.0*  ALBUMIN 4.0   No results for input(s): LIPASE, AMYLASE in the last 168 hours. Recent Labs  Lab 03/01/20 0350  AMMONIA 46*   Coagulation Profile: No results for input(s): INR, PROTIME in the last 168 hours. Cardiac Enzymes: No results for input(s): CKTOTAL, CKMB, CKMBINDEX, TROPONINI in the last 168 hours. BNP (last 3 results) No results for input(s): PROBNP in the last 8760 hours. HbA1C: No results for input(s): HGBA1C in the last 72 hours. CBG: No results for input(s): GLUCAP in the last 168 hours. Lipid Profile: No results for input(s): CHOL, HDL, LDLCALC, TRIG, CHOLHDL, LDLDIRECT in the last 72 hours. Thyroid Function Tests: No results for input(s): TSH, T4TOTAL, FREET4, T3FREE, THYROIDAB in the last 72 hours. Anemia Panel: No results for input(s): VITAMINB12, FOLATE, FERRITIN, TIBC,  IRON, RETICCTPCT in the last 72 hours. Urine analysis: No results found for: COLORURINE, APPEARANCEUR, LABSPEC, PHURINE, GLUCOSEU, HGBUR, BILIRUBINUR, KETONESUR, PROTEINUR, UROBILINOGEN, NITRITE, LEUKOCYTESUR  Radiological Exams on Admission - Personally Reviewed: CT Head Wo Contrast  Result Date: 03/01/2020 CLINICAL DATA:  Altered mental status with unclear cause EXAM: CT HEAD WITHOUT CONTRAST TECHNIQUE: Contiguous axial images were obtained from the base of the skull through the vertex without intravenous contrast. COMPARISON:  03/15/2018 FINDINGS: Brain: No evidence of acute infarction, hemorrhage, hydrocephalus, extra-axial collection or mass lesion/mass effect. Premature cortical atrophy, especially noted along the left temporal lobe and bilateral parietal cortex. Known history of dementia. There is a cluster of calcifications in the left parietal white matter and some appear elongated on sagittal reformats, possibly vascular. Vascular: Atherosclerotic calcification Skull: Normal. Negative for fracture or focal lesion. Sinuses/Orbits: No acute finding. IMPRESSION: 1. No acute or reversible finding. 2. Premature cortical atrophy. Electronically Signed   By: Kathrynn Ducking.D.  On: 03/01/2020 04:24   DG Chest Port 1 View  Result Date: 03/01/2020 CLINICAL DATA:  Altered mental status EXAM: PORTABLE CHEST 1 VIEW COMPARISON:  None. FINDINGS: Lordotic positioning. The heart size and mediastinal contours are within normal limits. Both lungs are clear. The visualized skeletal structures are unremarkable. IMPRESSION: No active disease. Electronically Signed   By: Keith Rake M.D.   On: 03/01/2020 03:17    EKG: Personally reviewed.  Rhythm is normal sinus rhythm with heart rate of 86.  No dynamic ST segment changes appreciated.  Assessment/Plan Principal Problem:   Frontotemporal dementia with behavioral disturbance ([place])   Patient presenting with extreme agitation and confusion, a  significant change compared to his baseline for approximately 2 to 3 weeks according to the wife.  Per my review of records by Dr.Aquino, she was formally diagnosed with frontotemporal dementia in 2020 however it is suspected that the patient has likely been suffering from this for approximately 10 years.  Considering the rapid change of the patient's behavior, patient is suffering from superimposed causes of confusion and psychosis.  Dehydration and hypernatremia can both cause encephalopathy.    Additionally, attempting to obtain a urinalysis to ensure patient is not suffering from a urinary tract infection considering mild leukocytosis.  Vitamin B12, folate, TSH all ordered and pending.  Ammonia level is minimally elevated and unlikely to be contributing to the patient's presentation.  In order to continue the patient's work-up as well as concurrently treat patient's dehydration and hypernatremia, I will attempt to provide patient with intramuscular antipsychotics such as Haldol to attempt to calm the patient and proceed with plan of care.  5 mg IM  provided to the patient by the emergency department staff without effect, will repeat 10 mg IM now.  We will also gently hydrate patient with D5 water 100 cc an hour while performing serial chemistries every 4 hours to ensure slow measured correction of hypernatremia.  If we correct the patient's hypernatremia and dehydration without return of patient's symptoms to baseline, will involve psychiatry for adjustment of antipsychotic agents.  Long-term, patient should receive daily Lexapro in addition to a modest regimen of a daily antipsychotic, patient has been on Seroquel nightly in the past.  Patient will likely need discharge to a skilled nursing facility/memory care unit.  Patient's wife is debilitated due to a stroke and states she is unable to care for him.  Active Problems: Dehydration with hypernatremia   Please see assessment and plan  above.    Essential hypertension  Patient is currently normotensive despite not taking his antihypertensives  We will simply provide patient with as needed antihypertensives for excessively elevated blood pressures.   Code Status:  DNR Family Communication: Case has been discussed with wife at length and she has been updated on plan of care.  Status is: Inpatient  Remains inpatient appropriate because:Altered mental status, Ongoing diagnostic testing needed not appropriate for outpatient work up, IV treatments appropriate due to intensity of illness or inability to take PO and Inpatient level of care appropriate due to severity of illness   Dispo: The patient is from: Home              Anticipated d/c is to: SNF              Anticipated d/c date is: 3 days              Patient currently is not medically stable to d/c.  Marinda Elk MD [facility] Pager [phone]  If 7PM-7AM, please contact night-coverage [email or web address] Use universal [place] password for that web site. If you do not have the password, please call the hospital operator.  03/01/2020, 7:00 AM

## 2020-03-01 NOTE — ED Provider Notes (Signed)
[facility] EMERGENCY DEPARTMENT Provider Note   CSN: [id number] Arrival date & time: 03/01/20  0231   History Chief Complaint  Patient presents with  . Altered Mental Status    Micheal [name] is a 64 y.o. male.  The history is provided by the EMS personnel and the spouse. The history is limited by the condition of the patient (Dementia).  Altered Mental Status 64 year old male with history of hypertension and frontotemporal dementia is brought in by ambulance after demonstrating behavioral deterioration at home.  Apparently, he had put his pants on backwards, was drinking was discharged sauce from the refrigerator, was eating out of trash.  Patient is unable to give any history.  Of note, he has received the COVID-19 immunization series.  Past Medical History:  Diagnosis Date  . Hypertension     There are no problems to display for this patient.   No past surgical history on file.     Family History  Problem Relation Age of Onset  . Dementia Father   . Parkinsonism Father   . Dementia Paternal Aunt   . Dementia Paternal Uncle   . Dementia Paternal Grandfather     Social History   Tobacco Use  . Smoking status: Never Smoker  . Smokeless tobacco: Never Used  Substance Use Topics  . Alcohol use: Never  . Drug use: Never    Home Medications Prior to Admission medications   Medication Sig Start Date End Date Taking? Authorizing Provider  atenolol-chlorthalidone (TENORETIC) 50-25 MG tablet Take 1 tablet by mouth daily.    [provider]  brimonidine (ALPHAGAN) 0.2 % ophthalmic solution 3 (three) times daily.    [provider]  dorzolamide (TRUSOPT) 2 % ophthalmic solution 1 drop 3 (three) times daily.    [provider]  escitalopram (LEXAPRO) 20 MG tablet Take 1 tablet (20 mg total) by mouth daily. 12/15/19   Van Clines, MD  QUEtiapine (SEROQUEL) 25 MG tablet Take 1 tablet (25 mg total) by mouth at bedtime.  02/28/20   Van Clines, MD  timolol (TIMOPTIC) 0.5 % ophthalmic solution 1 drop 2 (two) times daily.    [provider]    Allergies    Patient has no known allergies.  Review of Systems   Review of Systems  Unable to perform ROS: Dementia    Physical Exam Updated Vital Signs BP 133/87 (BP Location: Left Arm)   Pulse 86   Temp 99.7 F (37.6 C) (Oral)   Resp 17   Ht 6' (1.829 m)   Wt 86.2 kg   SpO2 96%   BMI 25.77 kg/m   Physical Exam Vitals and nursing note reviewed.   64 year old male, resting comfortably and in no acute distress. Vital signs are normal. Oxygen saturation is 96%, which is normal. Head is normocephalic and atraumatic. PERRLA, EOMI. Oropharynx is clear. Neck is nontender and supple without adenopathy or JVD. Back is nontender and there is no CVA tenderness. Lungs are clear without rales, wheezes, or rhonchi. Chest is nontender. Heart has regular rate and rhythm without murmur. Abdomen is soft, flat, nontender without masses or hepatosplenomegaly and peristalsis is normoactive. Extremities have no cyanosis or edema, full range of motion is present. Skin is warm and dry without rash. Neurologic: Awake and alert, oriented to person but not place or time, intermittently able to follow simple commands but not consistently, cranial nerves are intact, there are no gross motor or sensory deficits.  ED Results /  Procedures / Treatments   Labs (all labs ordered are listed, but only abnormal results are displayed) Labs Reviewed  COMPREHENSIVE METABOLIC PANEL - Abnormal; Notable for the following components:      Result Value   Sodium 156 (*)    Chloride 119 (*)    Glucose, Bld 148 (*)    BUN 42 (*)    Total Protein 6.0 (*)    AST 42 (*)    ALT 52 (*)    All other components within normal limits  CBC WITH DIFFERENTIAL/PLATELET - Abnormal; Notable for the following components:   WBC 11.0 (*)    MCV 105.9 (*)    Platelets 131 (*)    All other  components within normal limits  AMMONIA - Abnormal; Notable for the following components:   Ammonia 46 (*)    All other components within normal limits  URINALYSIS, ROUTINE W REFLEX MICROSCOPIC    EKG EKG Interpretation  Date/Time:  Wednesday Mar 01 2020 02:50:58 EDT Ventricular Rate:  86 PR Interval:    QRS Duration: 95 QT Interval:  341 QTC Calculation: 408 R Axis:   72 Text Interpretation: Sinus rhythm Normal ECG No old tracing to compare Confirmed by Delora Fuel ([id number]) on 03/01/2020 2:54:38 AM   Radiology CT Head Wo Contrast  Result Date: 03/01/2020 CLINICAL DATA:  Altered mental status with unclear cause EXAM: CT HEAD WITHOUT CONTRAST TECHNIQUE: Contiguous axial images were obtained from the base of the skull through the vertex without intravenous contrast. COMPARISON:  03/15/2018 FINDINGS: Brain: No evidence of acute infarction, hemorrhage, hydrocephalus, extra-axial collection or mass lesion/mass effect. Premature cortical atrophy, especially noted along the left temporal lobe and bilateral parietal cortex. Known history of dementia. There is a cluster of calcifications in the left parietal white matter and some appear elongated on sagittal reformats, possibly vascular. Vascular: Atherosclerotic calcification Skull: Normal. Negative for fracture or focal lesion. Sinuses/Orbits: No acute finding. IMPRESSION: 1. No acute or reversible finding. 2. Premature cortical atrophy. Electronically Signed   By: Monte Fantasia M.D.   On: 03/01/2020 04:24   DG Chest Port 1 View  Result Date: 03/01/2020 CLINICAL DATA:  Altered mental status EXAM: PORTABLE CHEST 1 VIEW COMPARISON:  None. FINDINGS: Lordotic positioning. The heart size and mediastinal contours are within normal limits. Both lungs are clear. The visualized skeletal structures are unremarkable. IMPRESSION: No active disease. Electronically Signed   By: Keith Rake M.D.   On: 03/01/2020 03:17    Procedures Procedures    CRITICAL CARE Performed by: Delora Fuel Total critical care time: 40 minutes Critical care time was exclusive of separately billable procedures and treating other patients. Critical care was necessary to treat or prevent imminent or life-threatening deterioration. Critical care was time spent personally by me on the following activities: development of treatment plan with patient and/or surrogate as well as nursing, discussions with consultants, evaluation of patient's response to treatment, examination of patient, obtaining history from patient or surrogate, ordering and performing treatments and interventions, ordering and review of laboratory studies, ordering and review of radiographic studies, pulse oximetry and re-evaluation of patient's condition.  Medications Ordered in ED Medications  haloperidol lactate (HALDOL) injection 5 mg (has no administration in time range)  sodium chloride 0.9 % bolus 1,000 mL (has no administration in time range)    ED Course  I have reviewed the triage vital signs and the nursing notes.  Pertinent labs & imaging results that were available during my care of the patient were reviewed  by me and considered in my medical decision making (see chart for details).  MDM Rules/Calculators/A&P Behavioral disturbance most likely deterioration of known frontotemporal dementia.  Will evaluate for occult infection and electrolyte disturbance.  ECG is normal.  Will check chest x-ray and CT of head as well as urinalysis.  Old records are reviewed confirming outpatient evaluation and treatment for frontotemporal dementia.  Patient was intermittently agitated and unable to cooperate for x-rays-especially CT scan.  He is given a dose of haloperidol with improvement.  Chest x-ray and CT of head were unremarkable.  ECG is normal.  Labs are significant for hypernatremia and elevated BUN consistent with dehydration.  Electrolyte disturbances sufficient to explain his altered  mentation.  He is given a liter of IV saline and will need to be admitted.  Case is discussed with Dr. Leafy Half of [facility], who agrees to admit the patient.  Final Clinical Impression(s) / ED Diagnoses Final diagnoses:  Hypernatremia  Prerenal azotemia  Altered mental status, unspecified altered mental status type    Rx / DC Orders ED Discharge Orders    None       Dione Booze, MD 03/01/20 [phone]

## 2020-03-01 NOTE — ED Notes (Signed)
Pt just pulled IV off and all monitor wires. But Pt will follow commands.

## 2020-03-01 NOTE — ED Notes (Signed)
Pt follows commands to a point. Pt still reuses to keep monitor leads on. However he will allow [name] to take briefly take Vitals.

## 2020-03-01 NOTE — ED Notes (Signed)
Patient instructed to walk to the bathroom, able to follow commands. Patient has no gait issue. Returned back to bed; resumed IV fluids. Urine sample sent to lab. Vitals updated. Patient positioned in bed; appears comfortable denies pain when asked. Side rails up for safety. Call light in reach.

## 2020-03-01 NOTE — ED Notes (Signed)
Patient in room, sitting in chair. Noted resting comfortably with eyes closed. Encouraged patient to move to bed for better positioning, patient refused. Attempted to establish IV access as well to initiate fluid hydration, unsuccessful.

## 2020-03-01 NOTE — ED Notes (Signed)
Lunch Tray Ordered @ 1035. 

## 2020-03-01 NOTE — ED Notes (Signed)
Pt  Has left room and refuses to go back into the room. However, Pt will follow commands in that he will not leave the proximity of the room and will stay in  The doorway.

## 2020-03-01 NOTE — ED Notes (Signed)
Lunch tray and wife at bedside.

## 2020-03-01 NOTE — Progress Notes (Signed)
Patient seen and examined, reviewed detailed H&P by Dr. Leafy Half this morning -Still waiting in the emergency room -Briefly this is a 64 year old male with frontotemporal dementia and hypertension who was brought to the emergency room with history of worsening agitation and confusion for the past few weeks, decline in level of functioning, erratic behavior, agitation.  No new changes in medications were noted or reported -Patient required multiple doses of Haldol overnight -At this time he is awake alert sitting in a chair beside the bed in the ER room he is awake alert, disoriented and confused, not in any acute distress. -Review of labs noted hypernatremia, he was started on D5 infusion however this was just started in the last 5 minutes hence repeat sodium has not had a chance to correct yet -Urinalysis and urine culture pending -CT head notes advanced atrophy premature for age -I suspect this is progression of his underlying frontotemporal dementia, I would expect hypernatremia if anything to make him more drowsy and not worsen his agitation. -B12 and TSH are within normal limits -Urinalysis is pending -I will increase his p.m. Seroquel dose from 25 to 50 mg nightly, use as needed Haldol, EKG in am -Request psych input  Zannie Cove, MD

## 2020-03-01 NOTE — ED Triage Notes (Signed)
Pt arrived from home via [facility]. Wife stated that he was eating out of the trash and not acting like himself. Pt. Has dementia. EMS stated that wife seemed confused as well. EMS CBG 147, vitals WDL. However, pt was diaphoretic. EMS vitals HR 80 Cap 42. Temp 94.1, BP 130/70.

## 2020-03-01 NOTE — ED Notes (Signed)
Son's name is Elenore Paddy and he can be reached at [phone]

## 2020-03-02 LAB — CBC WITH DIFFERENTIAL/PLATELET
Abs Immature Granulocytes: 0.03 10*3/uL (ref 0.00–0.07)
Basophils Absolute: 0 10*3/uL (ref 0.0–0.1)
Basophils Relative: 0 %
Eosinophils Absolute: 0.2 10*3/uL (ref 0.0–0.5)
Eosinophils Relative: 2 %
HCT: 40.1 % (ref 39.0–52.0)
Hemoglobin: 13.1 g/dL (ref 13.0–17.0)
Immature Granulocytes: 0 %
Lymphocytes Relative: 30 %
Lymphs Abs: 2.6 10*3/uL (ref 0.7–4.0)
MCH: 33.5 pg (ref 26.0–34.0)
MCHC: 32.7 g/dL (ref 30.0–36.0)
MCV: 102.6 fL — ABNORMAL HIGH (ref 80.0–100.0)
Monocytes Absolute: 0.6 10*3/uL (ref 0.1–1.0)
Monocytes Relative: 7 %
Neutro Abs: 5.3 10*3/uL (ref 1.7–7.7)
Neutrophils Relative %: 61 %
Platelets: 115 10*3/uL — ABNORMAL LOW (ref 150–400)
RBC: 3.91 MIL/uL — ABNORMAL LOW (ref 4.22–5.81)
RDW: 12.7 % (ref 11.5–15.5)
WBC: 8.7 10*3/uL (ref 4.0–10.5)
nRBC: 0 % (ref 0.0–0.2)

## 2020-03-02 LAB — BASIC METABOLIC PANEL
Anion gap: 10 (ref 5–15)
Anion gap: 8 (ref 5–15)
BUN: 28 mg/dL — ABNORMAL HIGH (ref 8–23)
BUN: 21 mg/dL (ref 8–23)
CO2: 27 mmol/L (ref 22–32)
CO2: 30 mmol/L (ref 22–32)
Calcium: 8.5 mg/dL — ABNORMAL LOW (ref 8.9–10.3)
Calcium: 8.2 mg/dL — ABNORMAL LOW (ref 8.9–10.3)
Chloride: 114 mmol/L — ABNORMAL HIGH (ref 98–111)
Chloride: 109 mmol/L (ref 98–111)
Creatinine, Ser: 0.77 mg/dL (ref 0.61–1.24)
Creatinine, Ser: 0.86 mg/dL (ref 0.61–1.24)
GFR calc Af Amer: >60 mL/min (ref >60)
GFR calc Af Amer: >60 mL/min (ref >60)
GFR calc non Af Amer: >60 mL/min (ref >60)
GFR calc non Af Amer: >60 mL/min (ref >60)
Glucose, Bld: 108 mg/dL — ABNORMAL HIGH (ref 70–99)
Glucose, Bld: 104 mg/dL — ABNORMAL HIGH (ref 70–99)
Potassium: 3.2 mmol/L — ABNORMAL LOW (ref 3.5–5.1)
Potassium: 3.1 mmol/L — ABNORMAL LOW (ref 3.5–5.1)
Sodium: 151 mmol/L — ABNORMAL HIGH (ref 135–145)
Sodium: 147 mmol/L — ABNORMAL HIGH (ref 135–145)

## 2020-03-02 LAB — MAGNESIUM: Magnesium: 2.1 mg/dL (ref 1.7–2.4)

## 2020-03-02 MED ORDER — DEXTROSE 5 % IV SOLN: INTRAVENOUS | Status: DC

## 2020-03-02 MED ORDER — POTASSIUM CHLORIDE CRYS ER 20 MEQ PO TBCR
40.0000 meq | EXTENDED_RELEASE_TABLET | Freq: Once | ORAL | Status: AC
  Administered 2020-03-02: 40 meq via ORAL
  Filled 2020-03-02: qty 2

## 2020-03-02 NOTE — Evaluation (Signed)
Physical Therapy Evaluation Patient Details Name: Micheal [name] MRN: [id number] DOB: 1956/01/16 Today's Date: 03/02/2020   History of Present Illness  Pt is a 64 y/o male admitted secondary to worsening agitation and confusion; possibly from progression of dementia. PMH includes HTN and dementia.   Clinical Impression  Pt admitted secondary to problem above with deficits below. Pt with poor cognition and speaking incomprehensible sentences throughout. Pt requiring min A for mobility tasks. Per wife, she is unable to care for pt as she is disabled herself. Also question wife's ability cognitively to care for pt. Feel pt is a high fall risk and would benefit from SNF level therapies at d/c. Will continue to follow acutely.     Follow Up Recommendations SNF;Supervision/Assistance - 24 hour    Equipment Recommendations  None recommended by PT    Recommendations for Other Services       Precautions / Restrictions Precautions Precautions: Fall Restrictions Weight Bearing Restrictions: No      Mobility  Bed Mobility Overal bed mobility: Needs Assistance Bed Mobility: Supine to Sit;Sit to Supine     Supine to sit: Min assist Sit to supine: Supervision   General bed mobility comments: Min A for LE assist. MAx cues for sequencing.   Transfers Overall transfer level: Needs assistance Equipment used: None Transfers: Sit to/from Stand Sit to Stand: Min assist         General transfer comment: Min A for lift assist and steadying.   Ambulation/Gait Ambulation/Gait assistance: Min assist Gait Distance (Feet): 30 Feet Assistive device: None Gait Pattern/deviations: Step-through pattern;Decreased stride length Gait velocity: Decreased   General Gait Details: Mildly unsteady gait requiring min A. Pt very easily distracted. Poor safety awareness as he was asking when he could get up by himself. Educated that it was not safe as he was unsteady and applied bed alarm at end of  session.   Stairs            Wheelchair Mobility    Modified Rankin (Stroke Patients Only)       Balance Overall balance assessment: Needs assistance Sitting-balance support: No upper extremity supported;Feet supported Sitting balance-Leahy Scale: Fair     Standing balance support: No upper extremity supported;During functional activity Standing balance-Leahy Scale: Poor Standing balance comment: Reliant on external support                              Pertinent Vitals/Pain Pain Assessment: No/denies pain    Home Living Family/patient expects to be discharged to:: Skilled nursing facility                      Prior Function Level of Independence: Independent               Hand Dominance        Extremity/Trunk Assessment   Upper Extremity Assessment Upper Extremity Assessment: Defer to OT evaluation    Lower Extremity Assessment Lower Extremity Assessment: Generalized weakness    Cervical / Trunk Assessment Cervical / Trunk Assessment: Kyphotic  Communication   Communication: No difficulties  Cognition Arousal/Alertness: Awake/alert Behavior During Therapy: WFL for tasks assessed/performed Overall Cognitive Status: History of cognitive impairments - at baseline                                 General Comments: Pt with dementia at baseline. Very confused, however,  pleasant. Speaking about topics unrelated to questions being asked.       General Comments General comments (skin integrity, edema, etc.): Pt wife present in room. Reports she can no longer take care of him as she is disabled as well. Also noted questionable caregiver cognition and awareness of safety and pt deficits.     Exercises     Assessment/Plan    PT Assessment Patient needs continued PT services  PT Problem List Decreased strength;Decreased mobility;Decreased balance;Decreased cognition;Decreased safety awareness;Decreased knowledge of use  of DME;Decreased knowledge of precautions       PT Treatment Interventions DME instruction;Gait training;Stair training;Functional mobility training;Therapeutic exercise;Therapeutic activities;Balance training;Patient/family education;Cognitive remediation    PT Goals (Current goals can be found in the Care Plan section)  Acute Rehab PT Goals Patient Stated Goal: for pt to go SNF per wife PT Goal Formulation: With family Time For Goal Achievement: 03/16/20 Potential to Achieve Goals: Good    Frequency Min 2X/week   Barriers to discharge Other (comment) wife unable to care for pt.     Co-evaluation               AM-PAC PT "6 Clicks" Mobility  Outcome Measure Help needed turning from your back to your side while in a flat bed without using bedrails?: A Little Help needed moving from lying on your back to sitting on the side of a flat bed without using bedrails?: A Little Help needed moving to and from a bed to a chair (including a wheelchair)?: A Little Help needed standing up from a chair using your arms (e.g., wheelchair or bedside chair)?: A Little Help needed to walk in hospital room?: A Little Help needed climbing 3-5 steps with a railing? : A Lot 6 Click Score: 17    End of Session Equipment Utilized During Treatment: Gait belt Activity Tolerance: Patient tolerated treatment well Patient left: in bed;with call bell/phone within reach;with bed alarm set;with family/visitor present Nurse Communication: Mobility status PT Visit Diagnosis: Unsteadiness on feet (R26.81);Muscle weakness (generalized) (M62.81)    Time: 5621-3086 PT Time Calculation (min) (ACUTE ONLY): 15 min   Charges:   PT Evaluation $PT Eval Moderate Complexity: 1 Mod          Reuel Derby, PT, DPT  Acute Rehabilitation Services  Pager: [phone] Office: [phone]   Rudean Hitt 03/02/2020, 5:32 PM

## 2020-03-02 NOTE — Progress Notes (Addendum)
PROGRESS NOTE    Micheal [name]  JGG:[id number] DOB: 07/19/1956 DOA: 03/01/2020 PCP: Kirby Funk, MD  Brief Narrative:Briefly this is a 64 year old male with frontotemporal dementia and hypertension who was brought to the emergency room with history of worsening agitation and confusion for the past few weeks, decline in level of functioning, erratic behavior, agitation.  No new changes in medications were noted or reported   Assessment & Plan:   Agitation, confusion, behavioral problems Progressive frontotemporal dementia -Suspect this is progression of patient's dementia, could be slightly worsened by hypernatremia -More calm this morning, but pleasantly confused -Seroquel dose was increased last night, -B12 and TSH are within normal limits, urinalysis is unremarkable -CT head notes advanced atrophy premature for age -PT OT consult -Psych consult requested -Anticipate will require placement, supervision  Dehydration, hypernatremia -Sodium improving, diuretics stopped, continue D5 water 1 more day  Hypertension -Blood pressure stable without meds, monitor, diuretics on hold   DVT prophylaxis: Lovenox Code Status: DNR Family Communication: No family at bedside, called and updated spouse Disposition Plan:  Status is: Inpatient  Remains inpatient appropriate because:Altered mental status   Dispo: The patient is from: Home              Anticipated d/c is to: SNF              Anticipated d/c date is: 2 days              Patient currently is not medically stable to d/c.   Consultants:   Psychiatry   Procedures:   Antimicrobials:    Subjective: -No events overnight, ongoing confusion but no issues with agitation  Objective: Vitals:   03/01/20 2332 03/02/20 0403 03/02/20 0925 03/02/20 1223  BP: (!) 103/59 109/73 126/70 132/81  Pulse: (!) 58 (!) 56 83 86  Resp: 18 17 18 18   Temp: 97.6 F (36.4 C) (!) 97.4 F (36.3 C) 98.6 F (37 C) 99.5 F (37.5 C)    TempSrc: Oral Oral Oral Oral  SpO2: 96% 96% 96% 98%  Weight:      Height:        Intake/Output Summary (Last 24 hours) at 03/02/2020 1508 Last data filed at 03/02/2020 1200 Gross per 24 hour  Intake 360 ml  Output 450 ml  Net -90 ml   Filed Weights   03/01/20 0247  Weight: 86.2 kg    Examination:  General exam: Pleasant male sitting up in bed, awake alert, disoriented and confused HEENT : Pupils are reactive, neck no JVD CVS S1-S2 regular rate rhythm Lungs are clear bilaterally Abdomen is soft, , nontender, bowel sounds present  Extremities: No edema Skin: No rashes on exposed skin Psychiatry: Poor insight and judgment    Data Reviewed:   CBC: Recent Labs  Lab 03/01/20 0350 03/02/20 0302  WBC 11.0* 8.7  NEUTROABS 7.7 5.3  HGB 14.3 13.1  HCT 45.0 40.1  MCV 105.9* 102.6*  PLT 131* 115*   Basic Metabolic Panel: Recent Labs  Lab 03/01/20 0350 03/01/20 1007 03/02/20 0302 03/02/20 1420  NA 156* 155* 151* 147*  K 3.5 3.4* 3.2* 3.1*  CL 119* 118* 114* 109  CO2 25 25 27 30   GLUCOSE 148* 122* 108* 104*  BUN 42* 41* 28* 21  CREATININE 1.14 0.95 0.77 0.86  CALCIUM 9.4 9.3 8.5* 8.2*  MG  --   --  2.1  --    GFR: Estimated Creatinine Clearance: 96.5 mL/min (by C-G formula based on SCr of 0.86 mg/dL). Liver  Function Tests: Recent Labs  Lab 03/01/20 0350  AST 42*  ALT 52*  ALKPHOS 48  BILITOT 0.5  PROT 6.0*  ALBUMIN 4.0   No results for input(s): LIPASE, AMYLASE in the last 168 hours. Recent Labs  Lab 03/01/20 0350  AMMONIA 46*   Coagulation Profile: No results for input(s): INR, PROTIME in the last 168 hours. Cardiac Enzymes: No results for input(s): CKTOTAL, CKMB, CKMBINDEX, TROPONINI in the last 168 hours. BNP (last 3 results) No results for input(s): PROBNP in the last 8760 hours. HbA1C: No results for input(s): HGBA1C in the last 72 hours. CBG: No results for input(s): GLUCAP in the last 168 hours. Lipid Profile: No results for  input(s): CHOL, HDL, LDLCALC, TRIG, CHOLHDL, LDLDIRECT in the last 72 hours. Thyroid Function Tests: Recent Labs    03/01/20 1007  TSH 1.295   Anemia Panel: Recent Labs    03/01/20 1007  VITAMINB12 430  FOLATE 18.4   Urine analysis:    Component Value Date/Time   COLORURINE YELLOW 03/01/2020 1600   APPEARANCEUR HAZY (A) 03/01/2020 1600   LABSPEC 1.035 (H) 03/01/2020 1600   PHURINE 5.0 03/01/2020 1600   GLUCOSEU NEGATIVE 03/01/2020 1600   HGBUR MODERATE (A) 03/01/2020 1600   BILIRUBINUR NEGATIVE 03/01/2020 1600   KETONESUR NEGATIVE 03/01/2020 1600   PROTEINUR 30 (A) 03/01/2020 1600   NITRITE NEGATIVE 03/01/2020 1600   LEUKOCYTESUR NEGATIVE 03/01/2020 1600   Sepsis Labs: @LABRCNTIP (procalcitonin:4,lacticidven:4)  ) Recent Results (from the past 240 hour(s))  SARS Coronavirus 2 by RT PCR (hospital order, performed in [place] hospital lab) Nasopharyngeal Nasopharyngeal Swab     Status: None   Collection Time: 03/01/20  7:58 AM   Specimen: Nasopharyngeal Swab  Result Value Ref Range Status   SARS Coronavirus 2 NEGATIVE NEGATIVE Final    Comment: (NOTE) SARS-CoV-2 target nucleic acids are NOT DETECTED. The SARS-CoV-2 RNA is generally detectable in upper and lower respiratory specimens during the acute phase of infection. The lowest concentration of SARS-CoV-2 viral copies this assay can detect is 250 copies / mL. A negative result does not preclude SARS-CoV-2 infection and should not be used as the sole basis for treatment or other patient management decisions.  A negative result may occur with improper specimen collection / handling, submission of specimen other than nasopharyngeal swab, presence of viral mutation(s) within the areas targeted by this assay, and inadequate number of viral copies (<250 copies / mL). A negative result must be combined with clinical observations, patient history, and epidemiological information. Fact Sheet for Patients:    StrictlyIdeas.no Fact Sheet for Healthcare Providers: BankingDealers.co.za This test is not yet approved or cleared  by the [place] FDA and has been authorized for detection and/or diagnosis of SARS-CoV-2 by FDA under an Emergency Use Authorization (EUA).  This EUA will remain in effect (meaning this test can be used) for the duration of the COVID-19 declaration under Section 564(b)(1) of the Act, 21 U.S.C. section 360bbb-3(b)(1), unless the authorization is terminated or revoked sooner. Performed at [facility], [place] [address], [place], [place] [address]          Radiology Studies: CT Head Wo Contrast  Result Date: 03/01/2020 CLINICAL DATA:  Altered mental status with unclear cause EXAM: CT HEAD WITHOUT CONTRAST TECHNIQUE: Contiguous axial images were obtained from the base of the skull through the vertex without intravenous contrast. COMPARISON:  03/15/2018 FINDINGS: Brain: No evidence of acute infarction, hemorrhage, hydrocephalus, extra-axial collection or mass lesion/mass effect. Premature cortical atrophy,  especially noted along the left temporal lobe and bilateral parietal cortex. Known history of dementia. There is a cluster of calcifications in the left parietal white matter and some appear elongated on sagittal reformats, possibly vascular. Vascular: Atherosclerotic calcification Skull: Normal. Negative for fracture or focal lesion. Sinuses/Orbits: No acute finding. IMPRESSION: 1. No acute or reversible finding. 2. Premature cortical atrophy. Electronically Signed   By: Marnee Spring M.D.   On: 03/01/2020 04:24   DG Chest Port 1 View  Result Date: 03/01/2020 CLINICAL DATA:  Altered mental status EXAM: PORTABLE CHEST 1 VIEW COMPARISON:  None. FINDINGS: Lordotic positioning. The heart size and mediastinal contours are within normal limits. Both lungs are clear. The visualized skeletal structures are unremarkable.  IMPRESSION: No active disease. Electronically Signed   By: Narda Rutherford M.D.   On: 03/01/2020 03:17        Scheduled Meds: . brimonidine  1 drop Both Eyes TID  . enoxaparin (LOVENOX) injection  40 mg Subcutaneous Q24H  . escitalopram  20 mg Oral Daily  . QUEtiapine  50 mg Oral QHS   Continuous Infusions: . dextrose 75 mL/hr at 03/02/20 1013     LOS: 1 day    Time spent:    Zannie Cove, MD [facility] [address] 0/27/2536, 6:44 PM

## 2020-03-02 NOTE — Consult Note (Signed)
[facility] Face-to-Face Psychiatry Consult   Reason for Consult:  "Dementia with increased agitation, behavioural problems" Referring Physician:  Dr. Joya Martyr Patient Identification: Micheal [name] MRN:  [id number] Principal Diagnosis: Frontotemporal dementia with behavioral disturbance (HCC) Diagnosis:  Principal Problem:   Frontotemporal dementia with behavioral disturbance (HCC) Active Problems:   Dehydration with hypernatremia   Essential hypertension   Total Time spent with patient: 30 minutes  Subjective:   "No matter what, when it is on, it is on period. I am not doing that that is terrible, I am not like that." Patient states "That is about all I can go on that really." "Yes I had two others that can be right there, we don't have any problem at all." "That is what they like to do and that is ok, it doesn't cause any problems." "I have not in quite that way, this is not who I am, to be I am grouchy, I do not find that at all." "As far as I know that is fine."    HPI: Micheal [name] is a 64 y.o. male patient.  Patient assessed by nurse practitioner.  Patient alert.  Patient oriented to self only at this time.  Patient pleasant and attempts to cooperate with assessment. Patient unable to meaningfully participate in assessment.  Patient gives verbal consent to speak with wife, Javari Bufkin.Spoke with patient's wife, Tyler Aas. who reports gradual and slow for a couple years. Patient behavior sitting on floor "playing in two bags of garbage and eating some of the garbage", also patient had "jumbled speech and it appeared he may become violent so I called 911." Mrs. Ander reports "I cannot physically take care of him as I had a stroke last October and I am currently using a cane."     Past Psychiatric History: Frontotemporal dementia with behavioral disturbance  Risk to Self:  Denies Risk to Others:  Denies Prior Inpatient Therapy:  None reported Prior Outpatient Therapy:  None  reported  Past Medical History:  Past Medical History:  Diagnosis Date  . Hypertension    History reviewed. No pertinent surgical history. Family History:  Family History  Problem Relation Age of Onset  . Dementia Father   . Parkinsonism Father   . Dementia Paternal Aunt   . Dementia Paternal Uncle   . Dementia Paternal Grandfather    Family Psychiatric  History: None reported Social History:  Social History   Substance and Sexual Activity  Alcohol Use Never     Social History   Substance and Sexual Activity  Drug Use Never    Social History   Socioeconomic History  . Marital status: Married    Spouse name: Not on file  . Number of children: Not on file  . Years of education: Not on file  . Highest education level: Not on file  Occupational History  . Not on file  Tobacco Use  . Smoking status: Never Smoker  . Smokeless tobacco: Never Used  Substance and Sexual Activity  . Alcohol use: Never  . Drug use: Never  . Sexual activity: Not on file  Other Topics Concern  . Not on file  Social History Narrative   Pt is right handed   Lives in 2 story home with his wife, Tyler Aas   Has 2 adult children   Bachelors Degree in IT security   Currently working as Market researcher with [employer]   Social Determinants of Health   Financial Resource Strain:   . Difficulty of Paying Living  Expenses:   Food Insecurity:   . Worried About Programme researcher, broadcasting/film/video in the Last Year:   . Barista in the Last Year:   Transportation Needs:   . Freight forwarder (Medical):   Marland Kitchen Lack of Transportation (Non-Medical):   Physical Activity:   . Days of Exercise per Week:   . Minutes of Exercise per Session:   Stress:   . Feeling of Stress :   Social Connections:   . Frequency of Communication with Friends and Family:   . Frequency of Social Gatherings with Friends and Family:   . Attends Religious Services:   . Active Member of Clubs or Organizations:   . Attends  Banker Meetings:   Marland Kitchen Marital Status:    Additional Social History:    Allergies:  No Known Allergies  Labs:  Results for orders placed or performed during the hospital encounter of 03/01/20 (from the past 48 hour(s))  Comprehensive metabolic panel     Status: Abnormal   Collection Time: 03/01/20  3:50 AM  Result Value Ref Range   Sodium 156 (H) 135 - 145 mmol/L   Potassium 3.5 3.5 - 5.1 mmol/L   Chloride 119 (H) 98 - 111 mmol/L   CO2 25 22 - 32 mmol/L   Glucose, Bld 148 (H) 70 - 99 mg/dL    Comment: Glucose reference range applies only to samples taken after fasting for at least 8 hours.   BUN 42 (H) 8 - 23 mg/dL   Creatinine, Ser 0.98 0.61 - 1.24 mg/dL   Calcium 9.4 8.9 - 11.9 mg/dL   Total Protein 6.0 (L) 6.5 - 8.1 g/dL   Albumin 4.0 3.5 - 5.0 g/dL   AST 42 (H) 15 - 41 U/L   ALT 52 (H) 0 - 44 U/L   Alkaline Phosphatase 48 38 - 126 U/L   Total Bilirubin 0.5 0.3 - 1.2 mg/dL   GFR calc non Af Amer >60 >60 mL/min   GFR calc Af Amer >60 >60 mL/min   Anion gap 12 5 - 15    Comment: Performed at [facility], [address], [place], [place] [address]  CBC with Differential     Status: Abnormal   Collection Time: 03/01/20  3:50 AM  Result Value Ref Range   WBC 11.0 (H) 4.0 - 10.5 K/uL   RBC 4.25 4.22 - 5.81 MIL/uL   Hemoglobin 14.3 13.0 - 17.0 g/dL   HCT 95.6 21.3 - 08.6 %   MCV 105.9 (H) 80.0 - 100.0 fL   MCH 33.6 26.0 - 34.0 pg   MCHC 31.8 30.0 - 36.0 g/dL   RDW 57.8 46.9 - 62.9 %   Platelets 131 (L) 150 - 400 K/uL   nRBC 0.0 0.0 - 0.2 %   Neutrophils Relative % 69 %   Neutro Abs 7.7 1.7 - 7.7 K/uL   Lymphocytes Relative 22 %   Lymphs Abs 2.4 0.7 - 4.0 K/uL   Monocytes Relative 8 %   Monocytes Absolute 0.8 0.1 - 1.0 K/uL   Eosinophils Relative 0 %   Eosinophils Absolute 0.0 0.0 - 0.5 K/uL   Basophils Relative 0 %   Basophils Absolute 0.0 0.0 - 0.1 K/uL   Immature Granulocytes 1 %   Abs Immature Granulocytes 0.05 0.00 - 0.07 K/uL    Comment:  Performed at [facility], [address], [place], [place] [address]  Ammonia     Status: Abnormal  Collection Time: 03/01/20  3:50 AM  Result Value Ref Range   Ammonia 46 (H) 9 - 35 umol/L    Comment: Performed at [facility], [address] [place], [place] [address]  SARS Coronavirus 2 by RT PCR (hospital order, performed in [facility] hospital lab) Nasopharyngeal Nasopharyngeal Swab     Status: None   Collection Time: 03/01/20  7:58 AM   Specimen: Nasopharyngeal Swab  Result Value Ref Range   SARS Coronavirus 2 NEGATIVE NEGATIVE    Comment: (NOTE) SARS-CoV-2 target nucleic acids are NOT DETECTED. The SARS-CoV-2 RNA is generally detectable in upper and lower respiratory specimens during the acute phase of infection. The lowest concentration of SARS-CoV-2 viral copies this assay can detect is 250 copies / mL. A negative result does not preclude SARS-CoV-2 infection and should not be used as the sole basis for treatment or other patient management decisions.  A negative result may occur with improper specimen collection / handling, submission of specimen other than nasopharyngeal swab, presence of viral mutation(s) within the areas targeted by this assay, and inadequate number of viral copies (<250 copies / mL). A negative result must be combined with clinical observations, patient history, and epidemiological information. Fact Sheet for Patients:   BoilerBrush.com.cyhttps://www.fda.gov/media/136312/download Fact Sheet for Healthcare Providers: https://pope.com/https://www.fda.gov/media/136313/download This test is not yet approved or cleared  by the [place] FDA and has been authorized for detection and/or diagnosis of SARS-CoV-2 by FDA under an Emergency Use Authorization (EUA).  This EUA will remain in effect (meaning this test can be used) for the duration of the COVID-19 declaration under Section 564(b)(1) of the Act, 21 U.S.C. section 360bbb-3(b)(1), unless the authorization is terminated  or revoked sooner. Performed at [facility], [address] [place], [place] [address]   Folate     Status: None   Collection Time: 03/01/20 10:07 AM  Result Value Ref Range   Folate 18.4 >5.9 ng/mL    Comment: Performed at [facility], [address] [place], [place] [address]  Vitamin B12     Status: None   Collection Time: 03/01/20 10:07 AM  Result Value Ref Range   Vitamin B-12 430 180 - 914 pg/mL    Comment: (NOTE) This assay is not validated for testing neonatal or myeloproliferative syndrome specimens for Vitamin B12 levels. Performed at [facility], [address] [place], [place] [address]   TSH     Status: None   Collection Time: 03/01/20 10:07 AM  Result Value Ref Range   TSH 1.295 0.350 - 4.500 uIU/mL    Comment: Performed by a 3rd Generation assay with a functional sensitivity of <=0.01 uIU/mL. Performed at [facility], [address] [place], [place] [address]   Basic metabolic panel     Status: Abnormal   Collection Time: 03/01/20 10:07 AM  Result Value Ref Range   Sodium 155 (H) 135 - 145 mmol/L   Potassium 3.4 (L) 3.5 - 5.1 mmol/L   Chloride 118 (H) 98 - 111 mmol/L   CO2 25 22 - 32 mmol/L   Glucose, Bld 122 (H) 70 - 99 mg/dL    Comment: Glucose reference range applies only to samples taken after fasting for at least 8 hours.   BUN 41 (H) 8 - 23 mg/dL   Creatinine, Ser 1.300.95 0.61 - 1.24 mg/dL   Calcium 9.3 8.9 - 86.510.3 mg/dL   GFR calc non Af Amer >60 >60 mL/min   GFR  calc Af Amer >60 >60 mL/min   Anion gap 12 5 - 15    Comment: Performed at [facility], [address], [place], [place] [address]  HIV Antibody (routine testing w rflx)     Status: None   Collection Time: 03/01/20 10:07 AM  Result Value Ref Range   HIV Screen 4th Generation wRfx Non Reactive Non Reactive    Comment: Performed at [facility], [address], [place], [place] [address]  Urinalysis, Routine w reflex microscopic     Status: Abnormal    Collection Time: 03/01/20  4:00 PM  Result Value Ref Range   Color, Urine YELLOW YELLOW   APPearance HAZY (A) CLEAR   Specific Gravity, Urine 1.035 (H) 1.005 - 1.030   pH 5.0 5.0 - 8.0   Glucose, UA NEGATIVE NEGATIVE mg/dL   Hgb urine dipstick MODERATE (A) NEGATIVE   Bilirubin Urine NEGATIVE NEGATIVE   Ketones, ur NEGATIVE NEGATIVE mg/dL   Protein, ur 30 (A) NEGATIVE mg/dL   Nitrite NEGATIVE NEGATIVE   Leukocytes,Ua NEGATIVE NEGATIVE   RBC / HPF 0-5 0 - 5 RBC/hpf   WBC, UA 0-5 0 - 5 WBC/hpf   Bacteria, UA RARE (A) NONE SEEN   Mucus PRESENT    Hyaline Casts, UA PRESENT    Uric Acid Crys, UA PRESENT     Comment: Performed at [facility], [address], [place], [place] [address]  Urine rapid drug screen (hosp performed)     Status: None   Collection Time: 03/01/20  4:00 PM  Result Value Ref Range   Opiates NONE DETECTED NONE DETECTED   Cocaine NONE DETECTED NONE DETECTED   Benzodiazepines NONE DETECTED NONE DETECTED   Amphetamines NONE DETECTED NONE DETECTED   Tetrahydrocannabinol NONE DETECTED NONE DETECTED   Barbiturates NONE DETECTED NONE DETECTED    Comment: (NOTE) DRUG SCREEN FOR MEDICAL PURPOSES ONLY.  IF CONFIRMATION IS NEEDED FOR ANY PURPOSE, NOTIFY LAB WITHIN 5 DAYS. LOWEST DETECTABLE LIMITS FOR URINE DRUG SCREEN Drug Class                     Cutoff (ng/mL) Amphetamine and metabolites    1000 Barbiturate and metabolites    200 Benzodiazepine                 200 Tricyclics and metabolites     300 Opiates and metabolites        300 Cocaine and metabolites        300 THC                            50 Performed at [facility], [address], [place], [place] [address]   Magnesium     Status: None   Collection Time: 03/02/20  3:02 AM  Result Value Ref Range   Magnesium 2.1 1.7 - 2.4 mg/dL    Comment: Performed at [facility], [address] [place], [place] [address]  CBC WITH DIFFERENTIAL     Status: Abnormal   Collection Time:  03/02/20  3:02 AM  Result Value Ref Range   WBC 8.7 4.0 - 10.5 K/uL   RBC 3.91 (L) 4.22 - 5.81 MIL/uL   Hemoglobin 13.1 13.0 - 17.0 g/dL   HCT 79.8 92.1 - 19.4 %   MCV 102.6 (H) 80.0 - 100.0 fL   MCH 33.5 26.0 - 34.0 pg   MCHC  32.7 30.0 - 36.0 g/dL   RDW 72.5 36.6 - 44.0 %   Platelets 115 (L) 150 - 400 K/uL    Comment: Immature Platelet Fraction may be clinically indicated, consider ordering this additional test [id number] PLATELET COUNT CONFIRMED BY SMEAR REPEATED TO VERIFY    nRBC 0.0 0.0 - 0.2 %   Neutrophils Relative % 61 %   Neutro Abs 5.3 1.7 - 7.7 K/uL   Lymphocytes Relative 30 %   Lymphs Abs 2.6 0.7 - 4.0 K/uL   Monocytes Relative 7 %   Monocytes Absolute 0.6 0.1 - 1.0 K/uL   Eosinophils Relative 2 %   Eosinophils Absolute 0.2 0.0 - 0.5 K/uL   Basophils Relative 0 %   Basophils Absolute 0.0 0.0 - 0.1 K/uL   Immature Granulocytes 0 %   Abs Immature Granulocytes 0.03 0.00 - 0.07 K/uL    Comment: Performed at [facility], [address], [place], [place] [address]  Basic metabolic panel     Status: Abnormal   Collection Time: 03/02/20  3:02 AM  Result Value Ref Range   Sodium 151 (H) 135 - 145 mmol/L   Potassium 3.2 (L) 3.5 - 5.1 mmol/L   Chloride 114 (H) 98 - 111 mmol/L   CO2 27 22 - 32 mmol/L   Glucose, Bld 108 (H) 70 - 99 mg/dL    Comment: Glucose reference range applies only to samples taken after fasting for at least 8 hours.   BUN 28 (H) 8 - 23 mg/dL   Creatinine, Ser 6.43 0.61 - 1.24 mg/dL   Calcium 8.5 (L) 8.9 - 10.3 mg/dL   GFR calc non Af Amer >60 >60 mL/min   GFR calc Af Amer >60 >60 mL/min   Anion gap 10 5 - 15    Comment: Performed at [facility], [address], [place], [place] [address]    Current Facility-Administered Medications  Medication Dose Route Frequency Provider Last Rate Last Admin  . acetaminophen (TYLENOL) tablet 650 mg  650 mg Oral Q6H PRN Shalhoub, Deno Lunger, MD       Or  . acetaminophen (TYLENOL) suppository 650 mg   650 mg Rectal Q6H PRN Shalhoub, Deno Lunger, MD      . brimonidine (ALPHAGAN) 0.2 % ophthalmic solution 1 drop  1 drop Both Eyes TID Shalhoub, Deno Lunger, MD      . dextrose 5 % solution   Intravenous Continuous Zannie Cove, MD      . enalaprilat (VASOTEC) injection 1.25 mg  1.25 mg Intravenous Q6H PRN Shalhoub, Deno Lunger, MD      . enoxaparin (LOVENOX) injection 40 mg  40 mg Subcutaneous Q24H Shalhoub, Deno Lunger, MD      . escitalopram (LEXAPRO) tablet 20 mg  20 mg Oral Daily Shalhoub, Deno Lunger, MD   20 mg at 03/01/20 2110  . haloperidol lactate (HALDOL) injection 2 mg  2 mg Intravenous Q6H PRN Zannie Cove, MD      . polyethylene glycol (MIRALAX / GLYCOLAX) packet 17 g  17 g Oral Daily PRN Marinda Elk, MD      . QUEtiapine (SEROQUEL) tablet 50 mg  50 mg Oral QHS Zannie Cove, MD   50 mg at 03/01/20 2110    Musculoskeletal: Strength & Muscle Tone: within normal limits Gait & Station: unable to assess Patient leans: N/A  Psychiatric Specialty Exam: Physical Exam  Nursing note and vitals reviewed. Constitutional: He appears well-developed.  HENT:  Head: Normocephalic.  Cardiovascular:  Normal rate.  Respiratory: Effort normal.  Neurological: He is alert.    Review of Systems  Blood pressure 109/73, pulse (!) 56, temperature (!) 97.4 F (36.3 C), temperature source Oral, resp. rate 17, height 6' (1.829 m), weight 86.2 kg, SpO2 96 %.Body mass index is 25.77 kg/m.  General Appearance: Casual and Fairly Groomed  Eye Contact:  Good  Speech:  Clear and Coherent and Normal Rate  Volume:  Normal  Mood:  Euthymic  Affect:  Congruent  Thought Process:  Coherent, Goal Directed and Descriptions of Associations: Intact  Orientation:  Other:  self only  Thought Content:  Logical  Suicidal Thoughts:  No  Homicidal Thoughts:  No  Memory:  Immediate;   Poor  Judgement:  Impaired  Insight:  Lacking  Psychomotor Activity:  Normal  Concentration:  Concentration: Fair  Recall:  Poor   Fund of Knowledge:  Fair  Language:  Fair  Akathisia:  No  Handed:  Right  AIMS (if indicated):     Assets:  Communication Skills Desire for Improvement Financial Resources/Insurance Housing Intimacy Leisure Time  ADL's:  unable to assess   Cognition:  Impaired,  Moderate  Sleep:        Treatment Plan Summary: Patient discussed with Dr Mallie Darting.  Patient is a 64 year old male diagnosed with frontotemporal dementia.  Patient presented to emergency department with worsening agitation and confusion for the past few weeks and increasingly erratic behavior.  Patient is cooperative with psych evaluation however patient  Confused.  Recommendation: -Discontinue Lexapro, initiate Zoloft 25 mg daily by mouth -Seroquel 50 mg nightly by mouth -Seroquel 25 mg by mouth as needed daily for agitation Recommend avoid Haldol with diagnosis of frontotemporal dementia. Recommend only 1 antipsychotic medication if possible. If patient refuses oral medications may consider switch to Zyprexa Zydis 2.5 mg.  Please re consult psychiatry as needed.    Disposition: No evidence of imminent risk to self or others at present.   Patient does not meet criteria for psychiatric inpatient admission.  Emmaline Kluver, FNP 03/02/2020 9:01 AM

## 2020-03-03 LAB — MAGNESIUM: Magnesium: 2 mg/dL (ref 1.7–2.4)

## 2020-03-03 LAB — BASIC METABOLIC PANEL
Anion gap: 9 (ref 5–15)
BUN: 16 mg/dL (ref 8–23)
CO2: 29 mmol/L (ref 22–32)
Calcium: 8.2 mg/dL — ABNORMAL LOW (ref 8.9–10.3)
Chloride: 107 mmol/L (ref 98–111)
Creatinine, Ser: 0.71 mg/dL (ref 0.61–1.24)
GFR calc Af Amer: >60 mL/min (ref >60)
GFR calc non Af Amer: >60 mL/min (ref >60)
Glucose, Bld: 105 mg/dL — ABNORMAL HIGH (ref 70–99)
Potassium: 2.7 mmol/L — CL (ref 3.5–5.1)
Sodium: 145 mmol/L (ref 135–145)

## 2020-03-03 MED ORDER — POTASSIUM CHLORIDE 20 MEQ PO PACK
40.0000 meq | PACK | Freq: Once | ORAL | Status: AC
  Administered 2020-03-03: 40 meq via ORAL
  Filled 2020-03-03: qty 2

## 2020-03-03 MED ORDER — SERTRALINE HCL 50 MG PO TABS
25.0000 mg | ORAL_TABLET | Freq: Every day | ORAL | Status: DC
  Administered 2020-03-04 – 2020-03-10 (×7): 25 mg via ORAL
  Filled 2020-03-03 (×9): qty 1

## 2020-03-03 MED ORDER — TIMOLOL MALEATE 0.5 % OP SOLN
1.0000 [drp] | Freq: Two times a day (BID) | OPHTHALMIC | Status: DC
  Administered 2020-03-03 – 2020-03-10 (×15): 1 [drp] via OPHTHALMIC
  Filled 2020-03-03: qty 5

## 2020-03-03 MED ORDER — POTASSIUM CHLORIDE CRYS ER 20 MEQ PO TBCR
40.0000 meq | EXTENDED_RELEASE_TABLET | Freq: Once | ORAL | Status: AC
  Administered 2020-03-03: 40 meq via ORAL
  Filled 2020-03-03: qty 2

## 2020-03-03 MED ORDER — POTASSIUM CHLORIDE 20 MEQ PO PACK
40.0000 meq | PACK | Freq: Once | ORAL | Status: AC
  Administered 2020-03-03: 40 meq via ORAL

## 2020-03-03 NOTE — Progress Notes (Signed)
PROGRESS NOTE    Micheal [name]  AST:[id number] DOB: May 04, 1956 DOA: 03/01/2020 PCP: Kirby Funk, MD  Brief Narrative:Briefly this is a 64 year old male with frontotemporal dementia and hypertension who was brought to the emergency room with history of worsening agitation and confusion for the past few weeks, decline in level of functioning, erratic behavior, agitation.  No new changes in medications were noted or reported  Assessment & Plan:   Agitation, confusion, behavioral problems Progressive frontotemporal dementia -Suspect this is progression of patient's dementia, could be slightly worsened by hypernatremia -More calm this morning, but pleasantly confused -Seroquel dose increased -B12 and TSH are within normal limits, urinalysis is unremarkable -CT head notes advanced atrophy premature for age -PT OT consulted, SNF recommended for rehab -Psych consult requested, recommended changing citalopram to sertraline and agreed with increased dose of Seroquel -Social work consulted  Dehydration, hypernatremia -Sodium improving, stop IV fluids  Hypertension -Blood pressure stable without meds, monitor, diuretics on hold  Hypokalemia -Replace   DVT prophylaxis: Lovenox Code Status: DNR Family Communication: No family at bedside, called and updated spouse yesterday Disposition Plan:  Status is: Inpatient  Remains inpatient appropriate because:Altered mental status, hypokalemia, needs placement   Dispo: The patient is from: Home              Anticipated d/c is to: SNF              Anticipated d/c date is: 1-2 days              Patient currently is not medically stable to d/c.   Consultants:   Psychiatry   Procedures:   Antimicrobials:    Subjective: -No events overnight, ongoing confusion but without considerable agitation  Objective: Vitals:   03/02/20 2315 03/03/20 0356 03/03/20 0831 03/03/20 1243  BP: (!) 139/91 118/87 125/79 126/85  Pulse: 74 (!) 59 97  87  Resp: 16 16 16 14   Temp: 98.5 F (36.9 C) 98.6 F (37 C) 98.2 F (36.8 C) 98.9 F (37.2 C)  TempSrc: Oral  Oral Oral  SpO2: 97% 97% 96% 96%  Weight:      Height:        Intake/Output Summary (Last 24 hours) at 03/03/2020 1349 Last data filed at 03/03/2020 0835 Gross per 24 hour  Intake 1572.91 ml  Output 200 ml  Net 1372.91 ml   Filed Weights   03/01/20 0247  Weight: 86.2 kg    Examination:  General exam: Elderly pleasant male sitting up in bed awake alert disoriented and confused HEENT pupils reactive, neck no JVD CVS S1-S2 regular rate rhythm Lungs clear to auscultation bilaterally Abdomen is soft nontender bowel sounds present Extremities no edema  Skin: No rashes on exposed skin Psychiatry: Poor insight and judgment    Data Reviewed:   CBC: Recent Labs  Lab 03/01/20 0350 03/02/20 0302  WBC 11.0* 8.7  NEUTROABS 7.7 5.3  HGB 14.3 13.1  HCT 45.0 40.1  MCV 105.9* 102.6*  PLT 131* 115*   Basic Metabolic Panel: Recent Labs  Lab 03/01/20 0350 03/01/20 1007 03/02/20 0302 03/02/20 1420 03/03/20 0432 03/03/20 0820  NA 156* 155* 151* 147* 145  --   K 3.5 3.4* 3.2* 3.1* 2.7*  --   CL 119* 118* 114* 109 107  --   CO2 25 25 27 30 29   --   GLUCOSE 148* 122* 108* 104* 105*  --   BUN 42* 41* 28* 21 16  --   CREATININE 1.14 0.95 0.77 0.86 0.71  --  CALCIUM 9.4 9.3 8.5* 8.2* 8.2*  --   MG  --   --  2.1  --   --  2.0   GFR: Estimated Creatinine Clearance: 103.7 mL/min (by C-G formula based on SCr of 0.71 mg/dL). Liver Function Tests: Recent Labs  Lab 03/01/20 0350  AST 42*  ALT 52*  ALKPHOS 48  BILITOT 0.5  PROT 6.0*  ALBUMIN 4.0   No results for input(s): LIPASE, AMYLASE in the last 168 hours. Recent Labs  Lab 03/01/20 0350  AMMONIA 46*   Coagulation Profile: No results for input(s): INR, PROTIME in the last 168 hours. Cardiac Enzymes: No results for input(s): CKTOTAL, CKMB, CKMBINDEX, TROPONINI in the last 168 hours. BNP (last 3  results) No results for input(s): PROBNP in the last 8760 hours. HbA1C: No results for input(s): HGBA1C in the last 72 hours. CBG: No results for input(s): GLUCAP in the last 168 hours. Lipid Profile: No results for input(s): CHOL, HDL, LDLCALC, TRIG, CHOLHDL, LDLDIRECT in the last 72 hours. Thyroid Function Tests: Recent Labs    03/01/20 1007  TSH 1.295   Anemia Panel: Recent Labs    03/01/20 1007  VITAMINB12 430  FOLATE 18.4   Urine analysis:    Component Value Date/Time   COLORURINE YELLOW 03/01/2020 1600   APPEARANCEUR HAZY (A) 03/01/2020 1600   LABSPEC 1.035 (H) 03/01/2020 1600   PHURINE 5.0 03/01/2020 1600   GLUCOSEU NEGATIVE 03/01/2020 1600   HGBUR MODERATE (A) 03/01/2020 1600   BILIRUBINUR NEGATIVE 03/01/2020 1600   KETONESUR NEGATIVE 03/01/2020 1600   PROTEINUR 30 (A) 03/01/2020 1600   NITRITE NEGATIVE 03/01/2020 1600   LEUKOCYTESUR NEGATIVE 03/01/2020 1600   Sepsis Labs: @LABRCNTIP (procalcitonin:4,lacticidven:4)  ) Recent Results (from the past 240 hour(s))  SARS Coronavirus 2 by RT PCR (hospital order, performed in [place] hospital lab) Nasopharyngeal Nasopharyngeal Swab     Status: None   Collection Time: 03/01/20  7:58 AM   Specimen: Nasopharyngeal Swab  Result Value Ref Range Status   SARS Coronavirus 2 NEGATIVE NEGATIVE Final    Comment: (NOTE) SARS-CoV-2 target nucleic acids are NOT DETECTED. The SARS-CoV-2 RNA is generally detectable in upper and lower respiratory specimens during the acute phase of infection. The lowest concentration of SARS-CoV-2 viral copies this assay can detect is 250 copies / mL. A negative result does not preclude SARS-CoV-2 infection and should not be used as the sole basis for treatment or other patient management decisions.  A negative result may occur with improper specimen collection / handling, submission of specimen other than nasopharyngeal swab, presence of viral mutation(s) within the areas targeted by this  assay, and inadequate number of viral copies (<250 copies / mL). A negative result must be combined with clinical observations, patient history, and epidemiological information. Fact Sheet for Patients:   StrictlyIdeas.no Fact Sheet for Healthcare Providers: BankingDealers.co.za This test is not yet approved or cleared  by the [place] FDA and has been authorized for detection and/or diagnosis of SARS-CoV-2 by FDA under an Emergency Use Authorization (EUA).  This EUA will remain in effect (meaning this test can be used) for the duration of the COVID-19 declaration under Section 564(b)(1) of the Act, 21 U.S.C. section 360bbb-3(b)(1), unless the authorization is terminated or revoked sooner. Performed at [facility], [place] [address] [place], [place] [address]          Radiology Studies: No results found.      Scheduled Meds: . brimonidine  1 drop Both Eyes TID  .  enoxaparin (LOVENOX) injection  40 mg Subcutaneous Q24H  . escitalopram  20 mg Oral Daily  . QUEtiapine  50 mg Oral QHS  . timolol  1 drop Both Eyes BID   Continuous Infusions:    LOS: 2 days    Time spent:    Zannie Cove, MD [facility] [address] 6/54/6503, 5:46 PM

## 2020-03-03 NOTE — NC FL2 (Signed)
[place] MEDICAID FL2 LEVEL OF CARE SCREENING TOOL     IDENTIFICATION  Patient Name: Micheal [name] Birthdate: [birth year] Sex: male Admission Date (Current Location): 03/01/2020  [facility] and [place] Number:  Producer, television/film/video and Address:  [facility] [place], [address], [place], [place] [address]      Provider Number: [id number]  Attending Physician Name and Address:  Zannie Cove, MD  Relative Name and Phone Number:  Roark Rufo, [phone]    Current Level of Care: Hospital Recommended Level of Care: Skilled Nursing Facility Prior Approval Number:    Date Approved/Denied:   PASRR Number: [id number]  Discharge Plan:      Current Diagnoses: Patient Active Problem List   Diagnosis Date Noted  . Frontotemporal dementia with behavioral disturbance (HCC) 03/01/2020  . Dehydration with hypernatremia 03/01/2020  . Essential hypertension 03/01/2020    Orientation RESPIRATION BLADDER Height & Weight     Self  Normal Continent Weight: 190 lb (86.2 kg) Height:  6' (182.9 cm)  BEHAVIORAL SYMPTOMS/MOOD NEUROLOGICAL BOWEL NUTRITION STATUS      Continent Diet(See discharge summary)  AMBULATORY STATUS COMMUNICATION OF NEEDS Skin   Limited Assist Verbally Normal                       Personal Care Assistance Level of Assistance  Bathing, Feeding, Dressing Bathing Assistance: Limited assistance Feeding assistance: Independent Dressing Assistance: Limited assistance     Functional Limitations Info  Sight, Hearing, Speech Sight Info: Adequate Hearing Info: Adequate Speech Info: Adequate    SPECIAL CARE FACTORS FREQUENCY  PT (By licensed PT), OT (By licensed OT)     PT Frequency: 5x week OT Frequency: 5x week            Contractures Contractures Info: Not present    Additional Factors Info  Code Status, Allergies, Psychotropic Code Status Info: DNR Allergies Info: NKA Psychotropic Info: Quetipine (seroquel)          Current Medications (03/03/2020):  This is the current hospital active medication list Current Facility-Administered Medications  Medication Dose Route Frequency Provider Last Rate Last Admin  . acetaminophen (TYLENOL) tablet 650 mg  650 mg Oral Q6H PRN Shalhoub, Deno Lunger, MD       Or  . acetaminophen (TYLENOL) suppository 650 mg  650 mg Rectal Q6H PRN Shalhoub, Deno Lunger, MD      . brimonidine (ALPHAGAN) 0.2 % ophthalmic solution 1 drop  1 drop Both Eyes TID Shalhoub, Deno Lunger, MD   1 drop at 03/03/20 1056  . enalaprilat (VASOTEC) injection 1.25 mg  1.25 mg Intravenous Q6H PRN Shalhoub, Deno Lunger, MD      . enoxaparin (LOVENOX) injection 40 mg  40 mg Subcutaneous Q24H Shalhoub, Deno Lunger, MD   40 mg at 03/03/20 0820  . escitalopram (LEXAPRO) tablet 20 mg  20 mg Oral Daily Shalhoub, Deno Lunger, MD   20 mg at 03/03/20 1055  . haloperidol lactate (HALDOL) injection 2 mg  2 mg Intravenous Q6H PRN Zannie Cove, MD      . polyethylene glycol (MIRALAX / GLYCOLAX) packet 17 g  17 g Oral Daily PRN Marinda Elk, MD      . QUEtiapine (SEROQUEL) tablet 50 mg  50 mg Oral QHS Zannie Cove, MD   50 mg at 03/02/20 2132  . timolol (TIMOPTIC) 0.5 % ophthalmic solution 1 drop  1 drop Both Eyes BID Zannie Cove, MD   1 drop at 03/03/20 1056  Discharge Medications: Please see discharge summary for a list of discharge medications.  Relevant Imaging Results:  Relevant Lab Results:   Additional Information SS# [id number]  Kirstie Peri, Student-Social Work

## 2020-03-03 NOTE — TOC Initial Note (Signed)
Transition of Care [facility]) - Initial/Assessment Note    Patient Details  Name: Micheal [name] MRN: [id number] Date of Birth: [birth year]  [facility]) CM/SW Contact:    Terrilee Croak, Student-Social Work Phone Number: 03/03/2020, 12:04 PM  Clinical Narrative:                 MSW Intern went to [facility] with pt and spouse to discuss discharge recommendations. Pt unable to participate due to disorientation. Wife states she would like for pt to go to SNF, but they have no insurance. She is very concerned about taking him home as she is disabled and has no family support. She noted that she had applied for disability and VA benefits for him, but had not heard anything back. Therapy noted they were concerned about her cognition as well.  Financial counseling was consulted to work with the pt and spouse. Spouse noted she spoke with [facility] and got a quote from them. SW will follow up with pt and wife after financial counseling to arrange discharge plans. FL2 and fax out completed.  Expected Discharge Plan: Skilled Nursing Facility Barriers to Discharge: Continued Medical Work up, Inadequate or no insurance, Unsafe home situation, SNF Pending payor source - LOG, SNF Pending bed offer   Patient Goals and CMS Choice Patient states their goals for this hospitalization and ongoing recovery are:: Pt's wife states she would like for him to go to SNF, but they do not have insurance. CMS Medicare.gov Compare Post Acute Care list provided to:: Patient Represenative (must comment)(Spouse, Doris) Choice offered to / list presented to : Spouse  Expected Discharge Plan and Services Expected Discharge Plan: Skilled Nursing Facility In-house Referral: Clinical Social Work     Living arrangements for the past 2 months: Single Family Home                                      Prior Living Arrangements/Services Living arrangements for the past 2 months: Single Family Home Lives with::  Spouse Patient language and need for interpreter reviewed:: Yes Do you feel safe going back to the place where you live?: No   Pt's wife unable to care for pt as she is disabled and is scared to take him home for safety reasons.  Need for Family Participation in Patient Care: Yes (Comment) Care giver support system in place?: No (comment)   Criminal Activity/Legal Involvement Pertinent to Current Situation/Hospitalization: No - Comment as needed  Activities of Daily Living      Permission Sought/Granted Permission sought to share information with : Facility Industrial/product designer granted to share information with : Yes, Verbal Permission Granted  Share Information with NAME: Wes Lezotte     Permission granted to share info w Relationship: Spouse  Permission granted to share info w Contact Information: [phone]  Emotional Assessment Appearance:: Appears stated age Attitude/Demeanor/Rapport: Unable to Assess Affect (typically observed): Unable to Assess Orientation: : Oriented to Self Alcohol / Substance Use: Not Applicable Psych Involvement: No (comment)  Admission diagnosis:  Hypernatremia [E87.0] Frontotemporal dementia with behavioral disturbance (HCC) [G31.09, F02.81] Prerenal azotemia [R79.89] Altered mental status, unspecified altered mental status type [R41.82] Patient Active Problem List   Diagnosis Date Noted  . Frontotemporal dementia with behavioral disturbance (HCC) 03/01/2020  . Dehydration with hypernatremia 03/01/2020  . Essential hypertension 03/01/2020   PCP:  Kirby Funk, MD Pharmacy:   [phone] -  [place], [place] - [address] AT [address] [facility] [address] [place] [address] Phone: [phone] Fax: [phone]     Social Determinants of Health (SDOH) Interventions    Readmission Risk Interventions No flowsheet data found.

## 2020-03-03 NOTE — Plan of Care (Signed)

## 2020-03-03 NOTE — Progress Notes (Addendum)
Attempted q4 neuro assessment. Pt sleeping and became aggressive when trying to assess. Pt pushing and clenching fists yelling "stop!" and refusing to participate in assessment. Vitals stable.  Provider aware of refusal.

## 2020-03-03 NOTE — Evaluation (Signed)
Occupational Therapy Evaluation Patient Details Name: Micheal [name] MRN: [id number] DOB: 1956/06/25 Today's Date: 03/03/2020    History of Present Illness Pt is a 64 y/o male admitted secondary to worsening agitation and confusion; possibly from progression of dementia. PMH includes HTN and dementia.    Clinical Impression   This 64 y/o male presents with the above. PLOF obtained via chart review as pt with hx of cognitive impairments and no family/caregiver present during time of session. PTA pt living with spouse and completing ADL independently. Today pt performing room level mobility tasks without AD at [name] assist level. Pt is able to perform standing grooming ADL with minguard assist, but requiring mod cues for initiating/sequencing through functional tasks. Pt often with nonsensical speech when responding to questions during session. Noticed per chart spouse reporting she is unable to provide adequate assist for pt at home. Feel pt will progress with mobility tasks but recommend he have 24hr supervision/assist at time of discharge given level of cognitive impairments. Currently recommending SNF given need for 24hr care. Will continue to follow while pt remains acutely admitted.     Follow Up Recommendations  SNF;Supervision/Assistance - 24 hour    Equipment Recommendations  Other (comment)(defer to next venue )           Precautions / Restrictions Precautions Precautions: Fall Restrictions Weight Bearing Restrictions: No      Mobility Bed Mobility Overal bed mobility: Needs Assistance Bed Mobility: Supine to Sit     Supine to sit: Min guard     General bed mobility comments: for safety, cues to initiate and carry out motions   Transfers Overall transfer level: Needs assistance Equipment used: None Transfers: Sit to/from Stand Sit to Stand: Min guard         General transfer comment: for balance and safety, sit<>stand from EOB and recliner     Balance  Overall balance assessment: Needs assistance Sitting-balance support: No upper extremity supported;Feet supported Sitting balance-Leahy Scale: Good     Standing balance support: No upper extremity supported;During functional activity Standing balance-Leahy Scale: Fair                             ADL either performed or assessed with clinical judgement   ADL Overall ADL's : Needs assistance/impaired Eating/Feeding: Set up;Sitting   Grooming: Oral care;Wash/dry face;Min guard;Standing Grooming Details (indicate cue type and reason): mod cues for sequencing/initiating tasks  Upper Body Bathing: Min guard;Set up;Sitting   Lower Body Bathing: Minimal assistance;Sit to/from stand   Upper Body Dressing : Minimal assistance;Sitting   Lower Body Dressing: Minimal assistance;Sit to/from stand   Toilet Transfer: Min guard;Ambulation Toilet Transfer Details (indicate cue type and reason): simulated via transfer to recliner, room level mobility  Toileting- Clothing Manipulation and Hygiene: Minimal assistance;Sit to/from stand       Functional mobility during ADLs: Min guard General ADL Comments: pt mostly requiring assist due to cognitive impairments at this time      Vision         Perception     Praxis      Pertinent Vitals/Pain Pain Assessment: No/denies pain     Hand Dominance     Extremity/Trunk Assessment Upper Extremity Assessment Upper Extremity Assessment: Overall WFL for tasks assessed   Lower Extremity Assessment Lower Extremity Assessment: Defer to PT evaluation       Communication Communication Communication: No difficulties   Cognition Arousal/Alertness: Awake/alert Behavior During Therapy: [facility] for  tasks assessed/performed Overall Cognitive Status: History of cognitive impairments - at baseline                                 General Comments: Pt with dementia at baseline. Very confused, however, pleasant. Speaking about  topics unrelated to questions being asked.    General Comments  HR 100s-113 bpm during activity. no family/caregiver present during session    Exercises     Shoulder Instructions      Home Living Family/patient expects to be discharged to:: Skilled nursing facility                                        Prior Functioning/Environment Level of Independence: Independent                 OT Problem List: Decreased cognition;Decreased safety awareness;Decreased knowledge of use of DME or AE;Impaired balance (sitting and/or standing);Decreased activity tolerance      OT Treatment/Interventions: Self-care/ADL training;Therapeutic exercise;Energy conservation;DME and/or AE instruction;Therapeutic activities;Visual/perceptual remediation/compensation;Patient/family education;Balance training;Cognitive remediation/compensation    OT Goals(Current goals can be found in the care plan section) Acute Rehab OT Goals Patient Stated Goal: for pt to go SNF per wife OT Goal Formulation: With family Time For Goal Achievement: 03/17/20 Potential to Achieve Goals: Good  OT Frequency: Min 2X/week   Barriers to D/C:            Co-evaluation              AM-PAC OT "6 Clicks" Daily Activity     Outcome Measure Help from another person eating meals?: A Little Help from another person taking care of personal grooming?: A Little Help from another person toileting, which includes using toliet, bedpan, or urinal?: A Little Help from another person bathing (including washing, rinsing, drying)?: A Little Help from another person to put on and taking off regular upper body clothing?: A Little Help from another person to put on and taking off regular lower body clothing?: A Little 6 Click Score: 18   End of Session Equipment Utilized During Treatment: Gait belt Nurse Communication: Mobility status  Activity Tolerance: Patient tolerated treatment well Patient left: in  chair;with call bell/phone within reach;with chair alarm set  OT Visit Diagnosis: Other symptoms and signs involving cognitive function                Time: 9678-9381 OT Time Calculation (min): 28 min Charges:  OT General Charges $OT Visit: 1 Visit OT Evaluation $OT Eval Moderate Complexity: 1 Mod OT Treatments $Self Care/Home Management : 8-22 mins  Marcy Siren, OT Acute Rehabilitation Services Pager [phone] Office [phone]  Orlando Penner 03/03/2020, 11:55 AM

## 2020-03-04 LAB — BASIC METABOLIC PANEL
Anion gap: 6 (ref 5–15)
BUN: 17 mg/dL (ref 8–23)
CO2: 29 mmol/L (ref 22–32)
Calcium: 8.4 mg/dL — ABNORMAL LOW (ref 8.9–10.3)
Chloride: 110 mmol/L (ref 98–111)
Creatinine, Ser: 0.75 mg/dL (ref 0.61–1.24)
GFR calc Af Amer: >60 mL/min (ref >60)
GFR calc non Af Amer: >60 mL/min (ref >60)
Glucose, Bld: 90 mg/dL (ref 70–99)
Potassium: 3.7 mmol/L (ref 3.5–5.1)
Sodium: 145 mmol/L (ref 135–145)

## 2020-03-04 NOTE — Progress Notes (Addendum)
Attempted to wake pt for q4 neuro assessment. Pt refusing to answer questions and just states "go away." Pt refusing to follow commands at this time as well. Also attempted to ambulate pt to the bathroom, pt refusing and going back to sleep.

## 2020-03-04 NOTE — TOC Progression Note (Signed)
Transition of Care [facility]) - Progression Note    Patient Details  Name: Jamire Shabazz MRN: [id number] Date of Birth: 01-03-1956  Transition [facility]) CM/SW [place], [place] Phone Number: 03/04/2020, 1:42 PM  Clinical Narrative:    CSW contacted by RN, stating patient's wife Doris. CSW met with patient and wife bedside. Patient's wife expressed she received SSI application and requested help from [place]. CSW expressed financial counseling would be able to assist with SSI benefits. Patient expressed frustration and expressed she spoke with financial counseling Friday and they were not helpful. CSW expressed financial counseling is not available during the weekend, but [facility] team can follow-up with them during the week.   CSW attempted to be of assistance, leaving a message with medical records to inquire on a release for patient's wife, to provide with SSI documentation. CSW provided patient's wife with SS administration's fax number. No further questions expressed at this time.   Expected Discharge Plan: Skilled Nursing Facility Barriers to Discharge: Continued Medical Work up, Inadequate or no insurance, Unsafe home situation, SNF Pending payor source - LOG, SNF Pending bed offer  Expected Discharge Plan and Services Expected Discharge Plan: [place] In-house Referral: Clinical Social Work     Living arrangements for the past 2 months: Single Family Home                                       Social Determinants of Health (SDOH) Interventions    Readmission Risk Interventions No flowsheet data found.

## 2020-03-04 NOTE — Progress Notes (Signed)
PROGRESS NOTE    Micheal [name]  HAL:[id number] DOB: 11-Nov-1955 DOA: 03/01/2020 PCP: Kirby Funk, MD  Brief Narrative:Briefly this is a 63 year old male with frontotemporal dementia and hypertension who was brought to the emergency room with history of worsening agitation and confusion for the past few weeks, decline in level of functioning, erratic behavior, agitation.  No new changes in medications were noted or reported  Assessment & Plan:   Agitation, confusion, behavioral problems Progressive frontotemporal dementia -Suspect this is progression of patient's dementia, could be slightly worsened by hypernatremia -No evidence of acute infectious process  -Electrolytes/labs unremarkable, B12 and TSH are within normal limits, urinalysis is unremarkable -CT head notes advanced atrophy premature for age -Psychiatry consulting, agreed with increasing p.m. dose of Seroquel to 50 mg nightly and recommended changing citalopram to sertraline -Clinically improving and more stable, without significant agitation, pleasantly confused with considerable cognitive deficits -PT OT eval completed, SNF/24-hour supervision recommended -Social work Catering manager and following -Safe disposition is challenging  Dehydration, hypernatremia -Sodium improved, IV fluids discontinued  Hypertension -Blood pressure stable without meds, monitor, diuretics on hold  Hypokalemia -Replaced   DVT prophylaxis: Lovenox Code Status: DNR Family Communication: No family at bedside, called and updated spouse 5/28 Disposition Plan:  Status is: Inpatient  Remains inpatient appropriate because: Needs safe disposition, placement  Dispo: The patient is from: Home              Anticipated d/c is to: SNF              Anticipated d/c date is: To be determined, patient is medically stable for discharge awaiting safe disposition              Patient currently is medically stable to d/c.   Consultants:    Psychiatry   Procedures:   Antimicrobials:    Subjective: -No events overnight, apparently night nurse tried to wake him up multiple times to do neuro checks  Objective: Vitals:   03/03/20 1921 03/03/20 2326 03/04/20 0351 03/04/20 0819  BP: (!) 139/93 109/69 119/78 132/90  Pulse: 92 66 63 69  Resp: 19 16 16 14   Temp: 98.7 F (37.1 C) 97.8 F (36.6 C) (!) 97.5 F (36.4 C) 97.9 F (36.6 C)  TempSrc: Oral Axillary Oral Oral  SpO2: 98% 99% 100% 97%  Weight:      Height:        Intake/Output Summary (Last 24 hours) at 03/04/2020 1150 Last data filed at 03/03/2020 1802 Gross per 24 hour  Intake 250 ml  Output --  Net 250 ml   Filed Weights   03/01/20 0247  Weight: 86.2 kg    Examination:  General exam: Elderly pleasant male sitting up in bed, awake alert disoriented and confused but pleasant without agitation HEENT pupils reactive, no JVD CVS S1-S2 regular rate rhythm Lungs are clear bilaterally Abdomen is soft nontender with positive bowel sounds Extremities with no edema  Skin: No rashes on exposed skin Psychiatry: Poor insight and judgment    Data Reviewed:   CBC: Recent Labs  Lab 03/01/20 0350 03/02/20 0302  WBC 11.0* 8.7  NEUTROABS 7.7 5.3  HGB 14.3 13.1  HCT 45.0 40.1  MCV 105.9* 102.6*  PLT 131* 115*   Basic Metabolic Panel: Recent Labs  Lab 03/01/20 1007 03/02/20 0302 03/02/20 1420 03/03/20 0432 03/03/20 0820 03/04/20 0752  NA 155* 151* 147* 145  --  145  K 3.4* 3.2* 3.1* 2.7*  --  3.7  CL 118* 114* 109 107  --  110  CO2 25 27 30 29   --  29  GLUCOSE 122* 108* 104* 105*  --  90  BUN 41* 28* 21 16  --  17  CREATININE 0.95 0.77 0.86 0.71  --  0.75  CALCIUM 9.3 8.5* 8.2* 8.2*  --  8.4*  MG  --  2.1  --   --  2.0  --    GFR: Estimated Creatinine Clearance: 103.7 mL/min (by C-G formula based on SCr of 0.75 mg/dL). Liver Function Tests: Recent Labs  Lab 03/01/20 0350  AST 42*  ALT 52*  ALKPHOS 48  BILITOT 0.5  PROT 6.0*   ALBUMIN 4.0   No results for input(s): LIPASE, AMYLASE in the last 168 hours. Recent Labs  Lab 03/01/20 0350  AMMONIA 46*   Coagulation Profile: No results for input(s): INR, PROTIME in the last 168 hours. Cardiac Enzymes: No results for input(s): CKTOTAL, CKMB, CKMBINDEX, TROPONINI in the last 168 hours. BNP (last 3 results) No results for input(s): PROBNP in the last 8760 hours. HbA1C: No results for input(s): HGBA1C in the last 72 hours. CBG: No results for input(s): GLUCAP in the last 168 hours. Lipid Profile: No results for input(s): CHOL, HDL, LDLCALC, TRIG, CHOLHDL, LDLDIRECT in the last 72 hours. Thyroid Function Tests: No results for input(s): TSH, T4TOTAL, FREET4, T3FREE, THYROIDAB in the last 72 hours. Anemia Panel: No results for input(s): VITAMINB12, FOLATE, FERRITIN, TIBC, IRON, RETICCTPCT in the last 72 hours. Urine analysis:    Component Value Date/Time   COLORURINE YELLOW 03/01/2020 1600   APPEARANCEUR HAZY (A) 03/01/2020 1600   LABSPEC 1.035 (H) 03/01/2020 1600   PHURINE 5.0 03/01/2020 1600   GLUCOSEU NEGATIVE 03/01/2020 1600   HGBUR MODERATE (A) 03/01/2020 1600   BILIRUBINUR NEGATIVE 03/01/2020 1600   KETONESUR NEGATIVE 03/01/2020 1600   PROTEINUR 30 (A) 03/01/2020 1600   NITRITE NEGATIVE 03/01/2020 1600   LEUKOCYTESUR NEGATIVE 03/01/2020 1600   Sepsis Labs: @LABRCNTIP (procalcitonin:4,lacticidven:4)  ) Recent Results (from the past 240 hour(s))  SARS Coronavirus 2 by RT PCR (hospital order, performed in [facility] hospital lab) Nasopharyngeal Nasopharyngeal Swab     Status: None   Collection Time: 03/01/20  7:58 AM   Specimen: Nasopharyngeal Swab  Result Value Ref Range Status   SARS Coronavirus 2 NEGATIVE NEGATIVE Final    Comment: (NOTE) SARS-CoV-2 target nucleic acids are NOT DETECTED. The SARS-CoV-2 RNA is generally detectable in upper and lower respiratory specimens during the acute phase of infection. The lowest concentration of  SARS-CoV-2 viral copies this assay can detect is 250 copies / mL. A negative result does not preclude SARS-CoV-2 infection and should not be used as the sole basis for treatment or other patient management decisions.  A negative result may occur with improper specimen collection / handling, submission of specimen other than nasopharyngeal swab, presence of viral mutation(s) within the areas targeted by this assay, and inadequate number of viral copies (<250 copies / mL). A negative result must be combined with clinical observations, patient history, and epidemiological information. Fact Sheet for Patients:   [facility] Fact Sheet for Healthcare Providers: 03/03/20 This test is not yet approved or cleared  by the [employer] FDA and has been authorized for detection and/or diagnosis of SARS-CoV-2 by FDA under an Emergency Use Authorization (EUA).  This EUA will remain in effect (meaning this test can be used) for the duration of the COVID-19 declaration under Section 564(b)(1) of the Act, 21 U.S.C. section 360bbb-3(b)(1), unless the authorization is terminated or  revoked sooner. Performed at [facility], [place] [address] [place], [place] [address]          Radiology Studies: No results found.      Scheduled Meds: . brimonidine  1 drop Both Eyes TID  . enoxaparin (LOVENOX) injection  40 mg Subcutaneous Q24H  . QUEtiapine  50 mg Oral QHS  . sertraline  25 mg Oral Daily  . timolol  1 drop Both Eyes BID    LOS: 3 days    Time spent: 79min  Domenic Polite, MD [facility]  03/04/2020, 11:50 AM

## 2020-03-05 ENCOUNTER — Other Ambulatory Visit: Payer: Self-pay

## 2020-03-05 MED ORDER — LORAZEPAM 0.5 MG PO TABS
0.5000 mg | ORAL_TABLET | Freq: Four times a day (QID) | ORAL | Status: DC | PRN
  Administered 2020-03-06 – 2020-03-09 (×4): 0.5 mg via ORAL
  Filled 2020-03-05 (×4): qty 1

## 2020-03-05 NOTE — Progress Notes (Signed)
PROGRESS NOTE    Micheal [name]  ZOX:[id number] DOB: 14-Jan-1956 DOA: 03/01/2020 PCP: Lavone Orn, MD  Brief Narrative:Briefly this is a 65 year old male with frontotemporal dementia and hypertension who was brought to the emergency room with history of worsening agitation and confusion for the past few weeks, decline in level of functioning, erratic behavior, agitation.  No new changes in medications were noted or reported  Assessment & Plan:   Agitation, confusion, behavioral problems Progressive frontotemporal dementia -Suspect this is progression of patient's dementia, could be slightly worsened by hypernatremia -No evidence of acute infectious process  -Electrolytes/labs unremarkable, B12 and TSH are within normal limits, urinalysis is unremarkable -CT head notes advanced atrophy premature for age -Psychiatry consulting, agreed with increasing p.m. dose of Seroquel to 50 mg nightly and recommended changing citalopram to sertraline -Clinically improving and more stable, without significant agitation, pleasantly confused with considerable cognitive deficits -PT OT eval completed, SNF/24-hour supervision recommended -Social work and Development worker, community are following -Await safe disposition  Dehydration, hypernatremia -Sodium improved, IV fluids discontinued  Hypertension -Blood pressure stable without meds, monitor, diuretics on hold  Hypokalemia -Replaced   DVT prophylaxis: Lovenox Code Status: DNR Family Communication: No family at bedside, called and updated spouse 5/28, 5/29 Disposition Plan:  Status is: Inpatient  Remains inpatient appropriate because: Needs safe disposition, placement  Dispo: The patient is from: Home              Anticipated d/c is to: SNF              Anticipated d/c date is: To be determined, patient is medically stable for discharge awaiting safe disposition              Patient currently is medically stable to d/c.   Consultants:    Psychiatry   Procedures:   Antimicrobials:    Subjective: -No events overnight, awake alert, pleasantly confused  Objective: Vitals:   03/04/20 1954 03/04/20 2318 03/05/20 0328 03/05/20 0800  BP: 135/89 (!) 131/97 110/87 126/84  Pulse: 92 75 72 89  Resp: 18 14 17 18   Temp: 98.2 F (36.8 C) 97.7 F (36.5 C) 97.7 F (36.5 C) 97.6 F (36.4 C)  TempSrc: Oral Oral Oral Oral  SpO2: 98% 97% 97% 99%  Weight:      Height:        Intake/Output Summary (Last 24 hours) at 03/05/2020 1231 Last data filed at 03/04/2020 1816 Gross per 24 hour  Intake 150 ml  Output --  Net 150 ml   Filed Weights   03/01/20 0247  Weight: 86.2 kg    Examination:  General exam: Elderly pleasant male sitting up in bed eating breakfast, awake alert, disoriented but pleasant CVS S1-S2, regular rhythm Lungs are clear Abdomen is soft, nontender Extremities with no edema Skin: No rashes on exposed skin Psychiatry: Poor insight and judgment    Data Reviewed:   CBC: Recent Labs  Lab 03/01/20 0350 03/02/20 0302  WBC 11.0* 8.7  NEUTROABS 7.7 5.3  HGB 14.3 13.1  HCT 45.0 40.1  MCV 105.9* 102.6*  PLT 131* 811*   Basic Metabolic Panel: Recent Labs  Lab 03/01/20 1007 03/02/20 0302 03/02/20 1420 03/03/20 0432 03/03/20 0820 03/04/20 0752  NA 155* 151* 147* 145  --  145  K 3.4* 3.2* 3.1* 2.7*  --  3.7  CL 118* 114* 109 107  --  110  CO2 25 27 30 29   --  29  GLUCOSE 122* 108* 104* 105*  --  90  BUN 41* 28* 21 16  --  17  CREATININE 0.95 0.77 0.86 0.71  --  0.75  CALCIUM 9.3 8.5* 8.2* 8.2*  --  8.4*  MG  --  2.1  --   --  2.0  --    GFR: Estimated Creatinine Clearance: 103.7 mL/min (by C-G formula based on SCr of 0.75 mg/dL). Liver Function Tests: Recent Labs  Lab 03/01/20 0350  AST 42*  ALT 52*  ALKPHOS 48  BILITOT 0.5  PROT 6.0*  ALBUMIN 4.0   No results for input(s): LIPASE, AMYLASE in the last 168 hours. Recent Labs  Lab 03/01/20 0350  AMMONIA 46*   Coagulation  Profile: No results for input(s): INR, PROTIME in the last 168 hours. Cardiac Enzymes: No results for input(s): CKTOTAL, CKMB, CKMBINDEX, TROPONINI in the last 168 hours. BNP (last 3 results) No results for input(s): PROBNP in the last 8760 hours. HbA1C: No results for input(s): HGBA1C in the last 72 hours. CBG: No results for input(s): GLUCAP in the last 168 hours. Lipid Profile: No results for input(s): CHOL, HDL, LDLCALC, TRIG, CHOLHDL, LDLDIRECT in the last 72 hours. Thyroid Function Tests: No results for input(s): TSH, T4TOTAL, FREET4, T3FREE, THYROIDAB in the last 72 hours. Anemia Panel: No results for input(s): VITAMINB12, FOLATE, FERRITIN, TIBC, IRON, RETICCTPCT in the last 72 hours. Urine analysis:    Component Value Date/Time   COLORURINE YELLOW 03/01/2020 1600   APPEARANCEUR HAZY (A) 03/01/2020 1600   LABSPEC 1.035 (H) 03/01/2020 1600   PHURINE 5.0 03/01/2020 1600   GLUCOSEU NEGATIVE 03/01/2020 1600   HGBUR MODERATE (A) 03/01/2020 1600   BILIRUBINUR NEGATIVE 03/01/2020 1600   KETONESUR NEGATIVE 03/01/2020 1600   PROTEINUR 30 (A) 03/01/2020 1600   NITRITE NEGATIVE 03/01/2020 1600   LEUKOCYTESUR NEGATIVE 03/01/2020 1600   Sepsis Labs: @LABRCNTIP (procalcitonin:4,lacticidven:4)  ) Recent Results (from the past 240 hour(s))  SARS Coronavirus 2 by RT PCR (hospital order, performed in [facility] hospital lab) Nasopharyngeal Nasopharyngeal Swab     Status: None   Collection Time: 03/01/20  7:58 AM   Specimen: Nasopharyngeal Swab  Result Value Ref Range Status   SARS Coronavirus 2 NEGATIVE NEGATIVE Final    Comment: (NOTE) SARS-CoV-2 target nucleic acids are NOT DETECTED. The SARS-CoV-2 RNA is generally detectable in upper and lower respiratory specimens during the acute phase of infection. The lowest concentration of SARS-CoV-2 viral copies this assay can detect is 250 copies / mL. A negative result does not preclude SARS-CoV-2 infection and should not be used as  the sole basis for treatment or other patient management decisions.  A negative result may occur with improper specimen collection / handling, submission of specimen other than nasopharyngeal swab, presence of viral mutation(s) within the areas targeted by this assay, and inadequate number of viral copies (<250 copies / mL). A negative result must be combined with clinical observations, patient history, and epidemiological information. Fact Sheet for Patients:   03/03/20 Fact Sheet for Healthcare Providers: [employer] This test is not yet approved or cleared  by the https://pope.com/ FDA and has been authorized for detection and/or diagnosis of SARS-CoV-2 by FDA under an Emergency Use Authorization (EUA).  This EUA will remain in effect (meaning this test can be used) for the duration of the COVID-19 declaration under Section 564(b)(1) of the Act, 21 U.S.C. section 360bbb-3(b)(1), unless the authorization is terminated or revoked sooner. Performed at [facility], [address], [place], [place]  Radiology Studies: No results found.      Scheduled Meds: . brimonidine  1 drop Both Eyes TID  . enoxaparin (LOVENOX) injection  40 mg Subcutaneous Q24H  . QUEtiapine  50 mg Oral QHS  . sertraline  25 mg Oral Daily  . timolol  1 drop Both Eyes BID    LOS: 4 days   Time spent:  Zannie Cove, MD [facility]  03/05/2020, 12:31 PM

## 2020-03-06 NOTE — Plan of Care (Signed)
  Problem: Education: Goal: Knowledge of General Education information will improve Description: Including pain rating scale, medication(s)/side effects and non-pharmacologic comfort measures Outcome: Progressing   Problem: Nutrition: Goal: Adequate nutrition will be maintained Outcome: Progressing   

## 2020-03-06 NOTE — Progress Notes (Signed)
PROGRESS NOTE    Micheal [name]  PVX:[id number] DOB: 08/14/1956 DOA: 03/01/2020 PCP: Kirby Funk, MD  Brief Narrative:Briefly this is a 64 year old male with frontotemporal dementia and hypertension who was brought to the emergency room with history of worsening agitation and confusion for the past few weeks, decline in level of functioning, erratic behavior, agitation.  No new changes in medications were noted or reported  Assessment & Plan:   Agitation, confusion, behavioral problems Progressive frontotemporal dementia -Suspect this is progression of patient's dementia, could be slightly worsened by hypernatremia -No evidence of acute infectious process  -Electrolytes/labs unremarkable, B12 and TSH are within normal limits, chest x-ray, urinalysis is unremarkable -CT head notes advanced atrophy premature for age -Psychiatry consulted, agreed with increasing p.m. dose of Seroquel to 50 mg nightly and recommended changing citalopram to sertraline -Clinically improving and more stable, without significant agitation, pleasantly confused with considerable cognitive deficits -PT OT eval completed, SNF/24-hour supervision recommended -Social work and Artist are following -Remains stable, awaiting safe disposition  Dehydration, hypernatremia -Sodium improved, IV fluids discontinued  Hypertension -Blood pressure stable without meds, monitor, diuretics on hold  Hypokalemia -Replaced   DVT prophylaxis: Lovenox Code Status: DNR Family Communication: No family at bedside, called and updated spouse 5/28, 5/29 Disposition Plan:  Status is: Inpatient  Remains inpatient appropriate because: Needs safe disposition, placement  Dispo: The patient is from: Home              Anticipated d/c is to: SNF              Anticipated d/c date is: To be determined, patient is medically stable for discharge awaiting safe disposition              Patient currently is medically stable to  d/c.   Consultants:   Psychiatry   Procedures:   Antimicrobials:    Subjective: -Had mild increased agitation overnight but stable this morning  Objective: Vitals:   03/05/20 2318 03/06/20 0439 03/06/20 0807 03/06/20 1146  BP: 124/82 114/71 126/85 133/87  Pulse: 77 64 83 84  Resp: 17 17 18 18   Temp: (!) 97.5 F (36.4 C) 97.6 F (36.4 C) 97.6 F (36.4 C) 98 F (36.7 C)  TempSrc: Oral Oral Oral   SpO2: 96% 97% 97% 97%  Weight:      Height:        Intake/Output Summary (Last 24 hours) at 03/06/2020 1155 Last data filed at 03/06/2020 0844 Gross per 24 hour  Intake 540 ml  Output -  Net 540 ml   Filed Weights   03/01/20 0247  Weight: 86.2 kg    Examination:  General exam: Elderly pleasant male sitting up in bed, eating breakfast, awake alert, disoriented but pleasant CVS: S1-S2, regular rate rhythm Lungs: Clear bilaterally Abdomen: Soft, nontender, bowel sounds present Extremities: No edema Skin: No rashes on exposed skin Psychiatry: Poor insight and judgment    Data Reviewed:   CBC: Recent Labs  Lab 03/01/20 0350 03/02/20 0302  WBC 11.0* 8.7  NEUTROABS 7.7 5.3  HGB 14.3 13.1  HCT 45.0 40.1  MCV 105.9* 102.6*  PLT 131* 115*   Basic Metabolic Panel: Recent Labs  Lab 03/01/20 1007 03/02/20 0302 03/02/20 1420 03/03/20 0432 03/03/20 0820 03/04/20 0752  NA 155* 151* 147* 145  --  145  K 3.4* 3.2* 3.1* 2.7*  --  3.7  CL 118* 114* 109 107  --  110  CO2 25 27 30 29   --  29  GLUCOSE 122* 108* 104* 105*  --  90  BUN 41* 28* 21 16  --  17  CREATININE 0.95 0.77 0.86 0.71  --  0.75  CALCIUM 9.3 8.5* 8.2* 8.2*  --  8.4*  MG  --  2.1  --   --  2.0  --    GFR: Estimated Creatinine Clearance: 103.7 mL/min (by C-G formula based on SCr of 0.75 mg/dL). Liver Function Tests: Recent Labs  Lab 03/01/20 0350  AST 42*  ALT 52*  ALKPHOS 48  BILITOT 0.5  PROT 6.0*  ALBUMIN 4.0   No results for input(s): LIPASE, AMYLASE in the last 168 hours.  Recent Labs  Lab 03/01/20 0350  AMMONIA 46*   Coagulation Profile: No results for input(s): INR, PROTIME in the last 168 hours. Cardiac Enzymes: No results for input(s): CKTOTAL, CKMB, CKMBINDEX, TROPONINI in the last 168 hours. BNP (last 3 results) No results for input(s): PROBNP in the last 8760 hours. HbA1C: No results for input(s): HGBA1C in the last 72 hours. CBG: No results for input(s): GLUCAP in the last 168 hours. Lipid Profile: No results for input(s): CHOL, HDL, LDLCALC, TRIG, CHOLHDL, LDLDIRECT in the last 72 hours. Thyroid Function Tests: No results for input(s): TSH, T4TOTAL, FREET4, T3FREE, THYROIDAB in the last 72 hours. Anemia Panel: No results for input(s): VITAMINB12, FOLATE, FERRITIN, TIBC, IRON, RETICCTPCT in the last 72 hours. Urine analysis:    Component Value Date/Time   COLORURINE YELLOW 03/01/2020 1600   APPEARANCEUR HAZY (A) 03/01/2020 1600   LABSPEC 1.035 (H) 03/01/2020 1600   PHURINE 5.0 03/01/2020 1600   GLUCOSEU NEGATIVE 03/01/2020 1600   HGBUR MODERATE (A) 03/01/2020 1600   BILIRUBINUR NEGATIVE 03/01/2020 1600   KETONESUR NEGATIVE 03/01/2020 1600   PROTEINUR 30 (A) 03/01/2020 1600   NITRITE NEGATIVE 03/01/2020 1600   LEUKOCYTESUR NEGATIVE 03/01/2020 1600   Sepsis Labs: @LABRCNTIP (procalcitonin:4,lacticidven:4)  ) Recent Results (from the past 240 hour(s))  SARS Coronavirus 2 by RT PCR (hospital order, performed in [facility] hospital lab) Nasopharyngeal Nasopharyngeal Swab     Status: None   Collection Time: 03/01/20  7:58 AM   Specimen: Nasopharyngeal Swab  Result Value Ref Range Status   SARS Coronavirus 2 NEGATIVE NEGATIVE Final    Comment: (NOTE) SARS-CoV-2 target nucleic acids are NOT DETECTED. The SARS-CoV-2 RNA is generally detectable in upper and lower respiratory specimens during the acute phase of infection. The lowest concentration of SARS-CoV-2 viral copies this assay can detect is 250 copies / mL. A negative result does  not preclude SARS-CoV-2 infection and should not be used as the sole basis for treatment or other patient management decisions.  A negative result may occur with improper specimen collection / handling, submission of specimen other than nasopharyngeal swab, presence of viral mutation(s) within the areas targeted by this assay, and inadequate number of viral copies (<250 copies / mL). A negative result must be combined with clinical observations, patient history, and epidemiological information. Fact Sheet for Patients:   03/03/20 Fact Sheet for Healthcare Providers: [employer] This test is not yet approved or cleared  by the https://pope.com/ FDA and has been authorized for detection and/or diagnosis of SARS-CoV-2 by FDA under an Emergency Use Authorization (EUA).  This EUA will remain in effect (meaning this test can be used) for the duration of the COVID-19 declaration under Section 564(b)(1) of the Act, 21 U.S.C. section 360bbb-3(b)(1), unless the authorization is terminated or revoked sooner. Performed at [facility], [address],  [place], [place] [address]          Radiology Studies: No results found.      Scheduled Meds: . brimonidine  1 drop Both Eyes TID  . enoxaparin (LOVENOX) injection  40 mg Subcutaneous Q24H  . QUEtiapine  50 mg Oral QHS  . sertraline  25 mg Oral Daily  . timolol  1 drop Both Eyes BID    LOS: 5 days   Time spent: 51min  Domenic Polite, MD [facility]  03/06/2020, 11:55 AM

## 2020-03-06 NOTE — Progress Notes (Signed)
Physical Therapy Treatment Patient Details Name: Micheal [name] MRN: [id number] DOB: 07-Oct-1956 Today's Date: 03/06/2020    History of Present Illness Pt is a 64 y/o male admitted secondary to worsening agitation and confusion; possibly from progression of dementia. PMH includes HTN and dementia.     PT Comments    Patient in bed upon arrival and agreeable to OOB mobility. Pt with tangential thoughts and nonsensical conversation for the entirety of session. Pt with cognitive impairments and balance deficits increasing risk for falls.  Current plan remains appropriate.    Follow Up Recommendations  SNF;Supervision/Assistance - 24 hour     Equipment Recommendations  None recommended by PT    Recommendations for Other Services       Precautions / Restrictions Precautions Precautions: Fall Restrictions Weight Bearing Restrictions: No    Mobility  Bed Mobility Overal bed mobility: Modified Independent Bed Mobility: Supine to Sit           General bed mobility comments: HOB elevated  Transfers Overall transfer level: Needs assistance Equipment used: None Transfers: Sit to/from Stand Sit to Stand: Min guard            Ambulation/Gait Ambulation/Gait assistance: Min assist;Min guard Gait Distance (Feet): (~200 ft with standing break) Assistive device: None Gait Pattern/deviations: Step-through pattern;Decreased stride length Gait velocity: Decreased   General Gait Details: verbal and visual cues required to navigate environment and frequent cues to stay focused on mobility tasks   Stairs             Wheelchair Mobility    Modified Rankin (Stroke Patients Only)       Balance Overall balance assessment: Needs assistance Sitting-balance support: No upper extremity supported;Feet supported Sitting balance-Leahy Scale: Good     Standing balance support: No upper extremity supported;During functional activity Standing balance-Leahy Scale: Fair                               Cognition Arousal/Alertness: Awake/alert Behavior During Therapy: WFL for tasks assessed/performed Overall Cognitive Status: History of cognitive impairments - at baseline                                 General Comments: Pt with dementia at baseline. pt with tangential thoughts and grossly nonsensical conversation      Exercises      General Comments        Pertinent Vitals/Pain Pain Assessment: Faces Faces Pain Scale: No hurt    Home Living                      Prior Function            PT Goals (current goals can now be found in the care plan section) Acute Rehab PT Goals Patient Stated Goal: for pt to go SNF per wife Progress towards PT goals: Progressing toward goals    Frequency    Min 2X/week      PT Plan Current plan remains appropriate    Co-evaluation              AM-PAC PT "6 Clicks" Mobility   Outcome Measure  Help needed turning from your back to your side while in a flat bed without using bedrails?: None Help needed moving from lying on your back to sitting on the side of a flat bed without using bedrails?: None  Help needed moving to and from a bed to a chair (including a wheelchair)?: A Little Help needed standing up from a chair using your arms (e.g., wheelchair or bedside chair)?: A Little Help needed to walk in hospital room?: A Little Help needed climbing 3-5 steps with a railing? : A Lot 6 Click Score: 19    End of Session Equipment Utilized During Treatment: Gait belt Activity Tolerance: Patient tolerated treatment well Patient left: with call bell/phone within reach;with family/visitor present;in chair;with chair alarm set Nurse Communication: Mobility status PT Visit Diagnosis: Unsteadiness on feet (R26.81);Muscle weakness (generalized) (M62.81)     Time: 7564-3329 PT Time Calculation (min) (ACUTE ONLY): 30 min  Charges:  $Gait Training: 23-37 mins                      Earney Navy, PTA Acute Rehabilitation Services Pager: [phone] Office: [phone]     Darliss Cheney 03/06/2020, 3:50 PM

## 2020-03-07 MED ORDER — QUETIAPINE FUMARATE 25 MG PO TABS
25.0000 mg | ORAL_TABLET | Freq: Two times a day (BID) | ORAL | Status: DC | PRN
Start: 2020-03-07 — End: 2021-06-29

## 2020-03-07 MED ORDER — ACETAMINOPHEN 325 MG PO TABS: 650.0000 mg | ORAL_TABLET | Freq: Four times a day (QID) | ORAL | Status: DC | PRN

## 2020-03-07 MED ORDER — SERTRALINE HCL 25 MG PO TABS: 25.0000 mg | ORAL_TABLET | Freq: Every day | ORAL | Status: DC

## 2020-03-07 MED ORDER — POLYETHYLENE GLYCOL 3350 17 G PO PACK: 17.0000 g | PACK | Freq: Every day | ORAL | 0 refills | Status: AC | PRN

## 2020-03-07 MED ORDER — QUETIAPINE FUMARATE 50 MG PO TABS
50.0000 mg | ORAL_TABLET | Freq: Every day | ORAL | Status: DC
Start: 2020-03-07 — End: 2021-08-14

## 2020-03-07 NOTE — NC FL2 (Signed)
[place] MEDICAID FL2 LEVEL OF CARE SCREENING TOOL     IDENTIFICATION  Patient Name: Barak Bialecki Birthdate: 1956-05-27 Sex: male Admission Date (Current Location): 03/01/2020  [place] and [place] Number:  Producer, television/film/video and Address:  [facility], [address] [place], [place] [address]      Provider Number: [id number]  Attending Physician Name and Address:  Zannie Cove, MD  Relative Name and Phone Number:  Yoshimi Sarr, [phone]    Current Level of Care: Hospital Recommended Level of Care: Memory Care Prior Approval Number:    Date Approved/Denied:   PASRR Number: [id number]  Discharge Plan: Other (Comment)(memory care)    Current Diagnoses: Patient Active Problem List   Diagnosis Date Noted  . Frontotemporal dementia with behavioral disturbance (HCC) 03/01/2020  . Dehydration with hypernatremia 03/01/2020  . Essential hypertension 03/01/2020    Orientation RESPIRATION BLADDER Height & Weight     Self  Normal Continent Weight: 190 lb (86.2 kg) Height:  6' (182.9 cm)  BEHAVIORAL SYMPTOMS/MOOD NEUROLOGICAL BOWEL NUTRITION STATUS      Continent Diet(See discharge summary)  AMBULATORY STATUS COMMUNICATION OF NEEDS Skin   Limited Assist Verbally Normal                       Personal Care Assistance Level of Assistance  Bathing, Feeding, Dressing Bathing Assistance: Limited assistance Feeding assistance: Independent Dressing Assistance: Limited assistance     Functional Limitations Info  Sight, Hearing, Speech Sight Info: Adequate Hearing Info: Adequate Speech Info: Adequate    SPECIAL CARE FACTORS FREQUENCY  OT (By licensed OT)                    Contractures Contractures Info: Not present    Additional Factors Info  Code Status, Allergies, Psychotropic Code Status Info: DNR Allergies Info: NKA Psychotropic Info: Quetipine (seroquel)         Current Medications (03/07/2020):  This is the  current hospital active medication list Current Facility-Administered Medications  Medication Dose Route Frequency Provider Last Rate Last Admin  . acetaminophen (TYLENOL) tablet 650 mg  650 mg Oral Q6H PRN Shalhoub, Deno Lunger, MD       Or  . acetaminophen (TYLENOL) suppository 650 mg  650 mg Rectal Q6H PRN Shalhoub, Deno Lunger, MD      . brimonidine (ALPHAGAN) 0.2 % ophthalmic solution 1 drop  1 drop Both Eyes TID Shalhoub, Deno Lunger, MD   1 drop at 03/07/20 0845  . enalaprilat (VASOTEC) injection 1.25 mg  1.25 mg Intravenous Q6H PRN Shalhoub, Deno Lunger, MD      . enoxaparin (LOVENOX) injection 40 mg  40 mg Subcutaneous Q24H Shalhoub, Deno Lunger, MD   40 mg at 03/07/20 0846  . haloperidol lactate (HALDOL) injection 2 mg  2 mg Intravenous Q6H PRN Zannie Cove, MD      . LORazepam (ATIVAN) tablet 0.5 mg  0.5 mg Oral Q6H PRN Zannie Cove, MD   0.5 mg at 03/06/20 2230  . polyethylene glycol (MIRALAX / GLYCOLAX) packet 17 g  17 g Oral Daily PRN Marinda Elk, MD      . QUEtiapine (SEROQUEL) tablet 50 mg  50 mg Oral QHS Zannie Cove, MD   50 mg at 03/06/20 2105  . sertraline (ZOLOFT) tablet 25 mg  25 mg Oral Daily Zannie Cove, MD   25 mg at 03/07/20 0846  . timolol (TIMOPTIC) 0.5 % ophthalmic solution 1 drop  1  drop Both Eyes BID Domenic Polite, MD   1 drop at 03/07/20 6116     Discharge Medications: Please see discharge summary for a list of discharge medications.  Relevant Imaging Results:  Relevant Lab Results:   Additional Information SS# [id number]  Geralynn Ochs, LCSW

## 2020-03-07 NOTE — Plan of Care (Signed)
  Problem: Nutrition: Goal: Adequate nutrition will be maintained Outcome: Progressing   Problem: Pain Managment: Goal: General experience of comfort will improve Outcome: Progressing   Problem: Skin Integrity: Goal: Risk for impaired skin integrity will decrease Outcome: Progressing   

## 2020-03-07 NOTE — Discharge Summary (Addendum)
Physician Discharge Summary  Micheal [name] ZOX:[id number] DOB: 12-Sep-1956 DOA: 03/01/2020  PCP: Kirby Funk, MD  Admit date: 03/01/2020 Discharge date: 03/10/20 Time spent: 35 minutes  Recommendations for Outpatient Follow-up:  1. PCP in 1 week 2. Needs long-term care    Discharge Diagnoses:  Principal Problem:   Frontotemporal dementia with behavioral disturbance (HCC)   Agitation   Dehydration with hypernatremia   Essential hypertension   Hypokalemia   DO NOT RESUSCITATE  Discharge Condition: Stable  Diet recommendation: Heart healthy  Filed Weights   03/01/20 0247  Weight: 86.2 kg    History of present illness:  Briefly this is a 64 year old male with frontotemporal dementia and hypertension who was brought to the emergency room with history of worsening agitation and confusion for the past few weeks, decline in level of functioning, erratic behavior, agitation.No new changes in medications were noted or reported  Hospital Course:   Agitation, confusion, behavioral problems Progressive frontotemporal dementia -Felt to be progression of patient's dementia, could be slightly worsened by hypernatremia -No evidence of acute infectious process  -Except for mildly elevated sodium, rest of labs were unremarkable, B12 and TSH are within normal limits, chest x-ray, urinalysis is unremarkable -CT head notes advanced atrophy premature for age -Psychiatry consulted for medical management, agreed with increasing p.m. dose of Seroquel to 50 mg nightly and recommended changing citalopram to sertraline and using Seroquel as needed for agitation -Clinically improving and more stable, without significant agitation, pleasantly confused with considerable cognitive deficits -PT OT eval completed, SNF/24-hour supervision recommended -Ideally needs long-term care  Dehydration, hypernatremia -Sodium improved, IV fluids discontinued -Diuretics discontinued  Hypertension -Blood  pressure stable without meds, monitor, diuretics stopped  Hypokalemia -Replaced  Code Status: DNR  Consultations:  Psychiatry  Discharge Exam: Vitals:   03/10/20 0431 03/10/20 0827  BP: 113/80 121/90  Pulse: 68 79  Resp: 16 16  Temp: 98.9 F (37.2 C) 98 F (36.7 C)  SpO2: 97% 98%    General: Awake alert, oriented to self only, pleasantly confused Cardiovascular: S1S2/RRR Respiratory: CTAB  Discharge Instructions    Allergies as of 03/10/2020   No Known Allergies     Medication List    STOP taking these medications   atenolol-chlorthalidone 50-25 MG tablet Commonly known as: TENORETIC   escitalopram 20 MG tablet Commonly known as: Lexapro     TAKE these medications   acetaminophen 325 MG tablet Commonly known as: TYLENOL Take 2 tablets (650 mg total) by mouth every 6 (six) hours as needed for mild pain (or Fever >/= 101).   brimonidine 0.2 % ophthalmic solution Commonly known as: ALPHAGAN 3 (three) times daily.   polyethylene glycol 17 g packet Commonly known as: MIRALAX / GLYCOLAX Take 17 g by mouth daily as needed for mild constipation.   QUEtiapine 50 MG tablet Commonly known as: SEROquel Take 1 tablet (50 mg total) by mouth at bedtime. What changed:   medication strength  how much to take   QUEtiapine 25 MG tablet Commonly known as: SEROquel Take 1 tablet (25 mg total) by mouth 2 (two) times daily as needed (agitation). What changed: You were already taking a medication with the same name, and this prescription was added. Make sure you understand how and when to take each.   sertraline 25 MG tablet Commonly known as: ZOLOFT Take 1 tablet (25 mg total) by mouth daily.   timolol 0.5 % ophthalmic solution Commonly known as: TIMOPTIC 1 drop 2 (two) times daily.  No Known Allergies Follow-up Information    Kirby Funk, MD. Schedule an appointment as soon as possible for a visit in 1 week(s).   Specialty: Internal  Medicine Contact information: [address] [place] [address] [phone]            The results of significant diagnostics from this hospitalization (including imaging, microbiology, ancillary and laboratory) are listed below for reference.    Significant Diagnostic Studies: CT Head Wo Contrast  Result Date: 03/01/2020 CLINICAL DATA:  Altered mental status with unclear cause EXAM: CT HEAD WITHOUT CONTRAST TECHNIQUE: Contiguous axial images were obtained from the base of the skull through the vertex without intravenous contrast. COMPARISON:  03/15/2018 FINDINGS: Brain: No evidence of acute infarction, hemorrhage, hydrocephalus, extra-axial collection or mass lesion/mass effect. Premature cortical atrophy, especially noted along the left temporal lobe and bilateral parietal cortex. Known history of dementia. There is a cluster of calcifications in the left parietal white matter and some appear elongated on sagittal reformats, possibly vascular. Vascular: Atherosclerotic calcification Skull: Normal. Negative for fracture or focal lesion. Sinuses/Orbits: No acute finding. IMPRESSION: 1. No acute or reversible finding. 2. Premature cortical atrophy. Electronically Signed   By: Marnee Spring M.D.   On: 03/01/2020 04:24   DG Chest Port 1 View  Result Date: 03/09/2020 CLINICAL DATA:  Screening for tuberculosis, altered level of consciousness EXAM: PORTABLE CHEST 1 VIEW COMPARISON:  03/01/2020 FINDINGS: Single frontal view of the chest excludes the lung apices by collimation. No airspace disease, effusion, or pneumothorax. No evidence of healed or active tuberculosis. Cardiac silhouette is unremarkable. IMPRESSION: 1. No acute intrathoracic process. Electronically Signed   By: Sharlet Salina M.D.   On: 03/09/2020 16:53   DG Chest Port 1 View  Result Date: 03/01/2020 CLINICAL DATA:  Altered mental status EXAM: PORTABLE CHEST 1 VIEW COMPARISON:  None. FINDINGS: Lordotic  positioning. The heart size and mediastinal contours are within normal limits. Both lungs are clear. The visualized skeletal structures are unremarkable. IMPRESSION: No active disease. Electronically Signed   By: Narda Rutherford M.D.   On: 03/01/2020 03:17    Microbiology: Recent Results (from the past 240 hour(s))  SARS Coronavirus 2 by RT PCR (hospital order, performed in [facility] hospital lab) Nasopharyngeal Nasopharyngeal Swab     Status: None   Collection Time: 03/01/20  7:58 AM   Specimen: Nasopharyngeal Swab  Result Value Ref Range Status   SARS Coronavirus 2 NEGATIVE NEGATIVE Final    Comment: (NOTE) SARS-CoV-2 target nucleic acids are NOT DETECTED. The SARS-CoV-2 RNA is generally detectable in upper and lower respiratory specimens during the acute phase of infection. The lowest concentration of SARS-CoV-2 viral copies this assay can detect is 250 copies / mL. A negative result does not preclude SARS-CoV-2 infection and should not be used as the sole basis for treatment or other patient management decisions.  A negative result may occur with improper specimen collection / handling, submission of specimen other than nasopharyngeal swab, presence of viral mutation(s) within the areas targeted by this assay, and inadequate number of viral copies (<250 copies / mL). A negative result must be combined with clinical observations, patient history, and epidemiological information. Fact Sheet for Patients:   BoilerBrush.com.cy Fact Sheet for Healthcare Providers: https://pope.com/ This test is not yet approved or cleared  by the [place] FDA and has been authorized for detection and/or diagnosis of SARS-CoV-2 by FDA under an Emergency Use Authorization (EUA).  This EUA will remain in effect (meaning  this test can be used) for the duration of the COVID-19 declaration under Section 564(b)(1) of the Act, 21 U.S.C. section  360bbb-3(b)(1), unless the authorization is terminated or revoked sooner. Performed at [facility], [place] [address] [place], [place] [address]   SARS Coronavirus 2 by RT PCR (hospital order, performed in [facility] hospital lab) Nasopharyngeal Nasopharyngeal Swab     Status: None   Collection Time: 03/09/20  3:03 PM   Specimen: Nasopharyngeal Swab  Result Value Ref Range Status   SARS Coronavirus 2 NEGATIVE NEGATIVE Final    Comment: (NOTE) SARS-CoV-2 target nucleic acids are NOT DETECTED. The SARS-CoV-2 RNA is generally detectable in upper and lower respiratory specimens during the acute phase of infection. The lowest concentration of SARS-CoV-2 viral copies this assay can detect is 250 copies / mL. A negative result does not preclude SARS-CoV-2 infection and should not be used as the sole basis for treatment or other patient management decisions.  A negative result may occur with improper specimen collection / handling, submission of specimen other than nasopharyngeal swab, presence of viral mutation(s) within the areas targeted by this assay, and inadequate number of viral copies (<250 copies / mL). A negative result must be combined with clinical observations, patient history, and epidemiological information. Fact Sheet for Patients:   StrictlyIdeas.no Fact Sheet for Healthcare Providers: BankingDealers.co.za This test is not yet approved or cleared  by the [place] FDA and has been authorized for detection and/or diagnosis of SARS-CoV-2 by FDA under an Emergency Use Authorization (EUA).  This EUA will remain in effect (meaning this test can be used) for the duration of the COVID-19 declaration under Section 564(b)(1) of the Act, 21 U.S.C. section 360bbb-3(b)(1), unless the authorization is terminated or revoked sooner. Performed at [facility], [place] [address] [place], [place] [address]      Labs: Basic  Metabolic Panel: Recent Labs  Lab 03/04/20 0752  NA 145  K 3.7  CL 110  CO2 29  GLUCOSE 90  BUN 17  CREATININE 0.75  CALCIUM 8.4*   Liver Function Tests: No results for input(s): AST, ALT, ALKPHOS, BILITOT, PROT, ALBUMIN in the last 168 hours. No results for input(s): LIPASE, AMYLASE in the last 168 hours. No results for input(s): AMMONIA in the last 168 hours. CBC: No results for input(s): WBC, NEUTROABS, HGB, HCT, MCV, PLT in the last 168 hours. Cardiac Enzymes: No results for input(s): CKTOTAL, CKMB, CKMBINDEX, TROPONINI in the last 168 hours. BNP: BNP (last 3 results) No results for input(s): BNP in the last 8760 hours.  ProBNP (last 3 results) No results for input(s): PROBNP in the last 8760 hours.  CBG: No results for input(s): GLUCAP in the last 168 hours.     Signed:  Shelly Coss MD.  [facility] 03/10/2020, 11:32 AM

## 2020-03-07 NOTE — TOC Progression Note (Signed)
Transition of Care [facility]) - Progression Note    Patient Details  Name: Micheal [name] MRN: [id number] Date of Birth: July 20, 1956  [facility] CM/SW Contact  Baldemar Lenis, [place] Phone Number: 03/07/2020, 10:15 AM  Clinical Narrative:   CSW alerted by RN that patient's wife continuing to ask for assistance with completing financial counseling paperwork. CSW asked financial counselor, Bennie Hind, to please reach out to wife for assistance to complete paperwork.     Expected Discharge Plan: Skilled Nursing Facility Barriers to Discharge: Continued Medical Work up, Inadequate or no insurance, Unsafe home situation, SNF Pending payor source - LOG, SNF Pending bed offer  Expected Discharge Plan and Services Expected Discharge Plan: Skilled Nursing Facility In-house Referral: Clinical Social Work     Living arrangements for the past 2 months: Single Family Home                                       Social Determinants of Health (SDOH) Interventions    Readmission Risk Interventions No flowsheet data found.

## 2020-03-07 NOTE — Progress Notes (Signed)
Occupational Therapy Treatment Patient Details Name: Micheal [name] MRN: [id number] DOB: January 14, 1956 Today's Date: 03/07/2020    History of present illness Pt is a 64 y/o male admitted secondary to worsening agitation and confusion; possibly from progression of dementia. PMH includes HTN and dementia.    OT comments  Pt making steady progress towards OT goals this session. Cognition appears to be the most limiting factor as pt able to complete household distance functional mobility with min guard assist with no AD. Pt with nonsensical speech throughout session but pleasantly confused. Assisted pt with self feeding tasks from sitting in bed by decreasing distracting stimuli on tray as pt noted to easily perseverate on extraneous stimuli. Agree with DC plan below, will follow acutely per POC.    Follow Up Recommendations  SNF;Supervision/Assistance - 24 hour    Equipment Recommendations  Other (comment)(defer to next venue of care)    Recommendations for Other Services      Precautions / Restrictions Precautions Precautions: Fall Restrictions Weight Bearing Restrictions: No       Mobility Bed Mobility Overal bed mobility: Modified Independent Bed Mobility: Supine to Sit;Sit to Supine     Supine to sit: Modified independent (Device/Increase time) Sit to supine: Modified independent (Device/Increase time)   General bed mobility comments: no physical assist needed; elevated HOB and use of rails  Transfers Overall transfer level: Needs assistance Equipment used: None Transfers: Sit to/from Stand Sit to Stand: Min guard         General transfer comment: min guard for balance and safety as pt moves impulsively throughout room    Balance Overall balance assessment: Needs assistance Sitting-balance support: No upper extremity supported;Feet supported Sitting balance-Leahy Scale: Good     Standing balance support: No upper extremity supported;During functional  activity Standing balance-Leahy Scale: Fair Standing balance comment: able to reach out of BOS during ambulaton with no LOB and minguard assist                           ADL either performed or assessed with clinical judgement   ADL Overall ADL's : Needs assistance/impaired Eating/Feeding: Set up;Sitting;Supervision/ safety                       Toilet Transfer: Min guard;Ambulation Toilet Transfer Details (indicate cue type and reason): simulated via functional mobility in room with no AD         Functional mobility during ADLs: Min guard General ADL Comments: pt limited most by cognitive impairments related to progressing dementia.     Vision       Perception     Praxis      Cognition Arousal/Alertness: Awake/alert Behavior During Therapy: WFL for tasks assessed/performed Overall Cognitive Status: History of cognitive impairments - at baseline                                 General Comments: pt making nonsensical conversation throughout session        Exercises     Shoulder Instructions       General Comments wife present during session; reporting being "very stresse out" encouraged wife to get some lunch while therapist present    Pertinent Vitals/ Pain       Pain Assessment: Faces Faces Pain Scale: No hurt  Home Living  Prior Functioning/Environment              Frequency  Min 2X/week        Progress Toward Goals  OT Goals(current goals can now be found in the care plan section)  Progress towards OT goals: Progressing toward goals  Acute Rehab OT Goals Patient Stated Goal: for pt to go SNF per wife OT Goal Formulation: With family Time For Goal Achievement: 03/17/20 Potential to Achieve Goals: Good  Plan Discharge plan remains appropriate    Co-evaluation                 AM-PAC OT "6 Clicks" Daily Activity     Outcome Measure    Help from another person eating meals?: A Little Help from another person taking care of personal grooming?: A Little Help from another person toileting, which includes using toliet, bedpan, or urinal?: A Little Help from another person bathing (including washing, rinsing, drying)?: A Little Help from another person to put on and taking off regular upper body clothing?: A Little Help from another person to put on and taking off regular lower body clothing?: A Little 6 Click Score: 18    End of Session    OT Visit Diagnosis: Other symptoms and signs involving cognitive function   Activity Tolerance Patient tolerated treatment well   Patient Left in bed;with call bell/phone within reach;with bed alarm set   Nurse Communication Mobility status        Time: 8527-7824 OT Time Calculation (min): 21 min  Charges: OT General Charges $OT Visit: 1 Visit OT Treatments $Self Care/Home Management : 8-22 mins  Audery Amel., COTA/L Acute Rehabilitation Services [phone]   Angelina Pih 03/07/2020, 3:41 PM

## 2020-03-07 NOTE — TOC Progression Note (Addendum)
Transition of Care [facility]) - Progression Note    Patient Details  Name: Micheal [name] MRN: [id number] Date of Birth: 02-17-1956  Transition of Care [facility]) CM/SW [place], [place] Phone Number: 03/07/2020, 1:09 PM  Clinical Narrative:   CSW updated by Sophronia Simas, financial counselor, earlier today that patient does not qualify for Medicaid, so patient will not qualify for LOG. CSW met with patient's wife at bedside to discuss her frustration with the slow pace of placing the patient. CSW explained the process of screening for Medicaid first to determine if patient would be able to receive assistance, and wife frustrated because she was told on Friday that he would not qualify for Medicaid. CSW indicated that financial counselor did not provide that information to CSW until today. Patient's wife interested in placing patient at [facility], if possible. CSW contacted [facility] to provide referral; a clinical liaison will reach out to CSW to discuss details. Patient's wife also asked about disability application, and CSW directed patient's wife to the disability office as that is beyond CSW's scope. CSW to follow.  UPDATE 3:28 PM: CSW received call from [place] at [facility], asked for information on patient. CSW faxed clinical information. Diane indicated that a nurse will need to come and assess the patient at the hospital, the nurse will contact CSW about scheduling visit. CSW updated patient's wife about progress. CSW to follow.    Expected Discharge Plan: Skilled Nursing Facility Barriers to Discharge: Continued Medical Work up, Inadequate or no insurance, Unsafe home situation, SNF Pending payor source - LOG, SNF Pending bed offer  Expected Discharge Plan and Services Expected Discharge Plan: [place] In-house Referral: Clinical Social Work     Living arrangements for the past 2 months: Single Family Home                                        Social Determinants of Health (SDOH) Interventions    Readmission Risk Interventions No flowsheet data found.

## 2020-03-08 NOTE — Progress Notes (Signed)
PROGRESS NOTE    Micheal [name]  QPY:[id number] DOB: [birth year] DOA: 03/01/2020 PCP: Lavone Orn, MD   Brief Narrative: 64 year old male with frontotemporal dementia and hypertension who was brought to the emergency room with history of worsening agitation and confusion for the past few weeks, decline in level of functioning, erratic behavior, agitation.No new changes in medications were noted or reported.  His hospital course remained stable.  Patient was seen by PT/OT and recommended skilled nursing facility on discharge.  Currently he is waiting for skilled nursing facility placement.  Hemodynamically stable for discharge as soon as bed is old.  Assessment & Plan:   Principal Problem:   Frontotemporal dementia with behavioral disturbance (HCC) Active Problems:   Dehydration with hypernatremia   Essential hypertension  Agitation, confusion, behavioral problems Progressive frontotemporal dementia -Felt to be progression of patient's dementia, could be slightly worsened by hypernatremia -No evidence of acute infectious process  -Except for mildly elevated sodium, rest of labs were unremarkable, B12 and TSH are within normal limits,chest x-ray,urinalysis is unremarkable -CT head notes advanced atrophy premature for age -Psychiatry consulted for medical management, agreed with increasing p.m. dose of Seroquel to 50 mg nightly and recommended changing citalopram to sertraline and using Seroquel as needed for agitation -Clinically improving and more stable, without significant agitation, pleasantly confused with considerable cognitive deficits -PT OT eval completed, SNF/24-hour supervision recommended -Ideally needs long-term care  Dehydration, hypernatremia -Sodium improved, IV fluids discontinued -Diuretics discontinued  Hypertension -Blood pressure stable without meds, monitor, diuretics stopped  Hypokalemia -Replaced          DVT prophylaxis:Lovenox Code Status:  DNR Family Communication: Wife was present at the bedside  Status is: Inpatient  Remains inpatient appropriate because:Unsafe d/c plan   Dispo: The patient is from: Home              Anticipated d/c is to: SNF              Anticipated d/c date is: 1 day              Patient currently is medically stable to d/c.      Consultants: None  Procedures:None  Antimicrobials:  Anti-infectives (From admission, onward)   None      Subjective: Patient seen and examined at the bedside this morning.  Hemodynamically stable.  Comfortable.  Denies any complaints.  Alert and awake but not oriented.  Objective: Vitals:   03/07/20 1927 03/07/20 2300 03/08/20 0441 03/08/20 0749  BP: 123/79 104/63 126/80 98/63  Pulse: 90 64 71 68  Resp: 18 17 20 16   Temp: 98.4 F (36.9 C) 97.8 F (36.6 C) 97.6 F (36.4 C) 97.8 F (36.6 C)  TempSrc: Oral Oral Oral Axillary  SpO2: 96% 99% 98% 98%  Weight:      Height:        Intake/Output Summary (Last 24 hours) at 03/08/2020 1120 Last data filed at 03/07/2020 1820 Gross per 24 hour  Intake 150 ml  Output --  Net 150 ml   Filed Weights   03/01/20 0247  Weight: 86.2 kg    Examination:  General exam: Appears calm and comfortable ,Not in distress,average built HEENT:PERRL,Oral mucosa moist, Ear/Nose normal on gross exam Respiratory system: Bilateral equal air entry, normal vesicular breath sounds, no wheezes or crackles  Cardiovascular system: S1 & S2 heard, RRR. No JVD, murmurs, rubs, gallops or clicks. No pedal edema. Gastrointestinal system: Abdomen is nondistended, soft and nontender. No organomegaly or masses felt. Normal bowel sounds  heard. Central nervous system: Alert and awake. No focal neurological deficits. Extremities: No edema, no clubbing ,no cyanosis Skin: No rashes, lesions or ulcers,no icterus ,no pallor Psychiatry: Judgement and insight appear impaired     Data Reviewed: I have personally reviewed following labs and imaging  studies  CBC: Recent Labs  Lab 03/02/20 0302  WBC 8.7  NEUTROABS 5.3  HGB 13.1  HCT 40.1  MCV 102.6*  PLT 115*   Basic Metabolic Panel: Recent Labs  Lab 03/02/20 0302 03/02/20 1420 03/03/20 0432 03/03/20 0820 03/04/20 0752  NA 151* 147* 145  --  145  K 3.2* 3.1* 2.7*  --  3.7  CL 114* 109 107  --  110  CO2 27 30 29   --  29  GLUCOSE 108* 104* 105*  --  90  BUN 28* 21 16  --  17  CREATININE 0.77 0.86 0.71  --  0.75  CALCIUM 8.5* 8.2* 8.2*  --  8.4*  MG 2.1  --   --  2.0  --    GFR: Estimated Creatinine Clearance: 103.7 mL/min (by C-G formula based on SCr of 0.75 mg/dL). Liver Function Tests: No results for input(s): AST, ALT, ALKPHOS, BILITOT, PROT, ALBUMIN in the last 168 hours. No results for input(s): LIPASE, AMYLASE in the last 168 hours. No results for input(s): AMMONIA in the last 168 hours. Coagulation Profile: No results for input(s): INR, PROTIME in the last 168 hours. Cardiac Enzymes: No results for input(s): CKTOTAL, CKMB, CKMBINDEX, TROPONINI in the last 168 hours. BNP (last 3 results) No results for input(s): PROBNP in the last 8760 hours. HbA1C: No results for input(s): HGBA1C in the last 72 hours. CBG: No results for input(s): GLUCAP in the last 168 hours. Lipid Profile: No results for input(s): CHOL, HDL, LDLCALC, TRIG, CHOLHDL, LDLDIRECT in the last 72 hours. Thyroid Function Tests: No results for input(s): TSH, T4TOTAL, FREET4, T3FREE, THYROIDAB in the last 72 hours. Anemia Panel: No results for input(s): VITAMINB12, FOLATE, FERRITIN, TIBC, IRON, RETICCTPCT in the last 72 hours. Sepsis Labs: No results for input(s): PROCALCITON, LATICACIDVEN in the last 168 hours.  Recent Results (from the past 240 hour(s))  SARS Coronavirus 2 by RT PCR (hospital order, performed in [facility] hospital lab) Nasopharyngeal Nasopharyngeal Swab     Status: None   Collection Time: 03/01/20  7:58 AM   Specimen: Nasopharyngeal Swab  Result Value Ref Range Status     SARS Coronavirus 2 NEGATIVE NEGATIVE Final    Comment: (NOTE) SARS-CoV-2 target nucleic acids are NOT DETECTED. The SARS-CoV-2 RNA is generally detectable in upper and lower respiratory specimens during the acute phase of infection. The lowest concentration of SARS-CoV-2 viral copies this assay can detect is 250 copies / mL. A negative result does not preclude SARS-CoV-2 infection and should not be used as the sole basis for treatment or other patient management decisions.  A negative result may occur with improper specimen collection / handling, submission of specimen other than nasopharyngeal swab, presence of viral mutation(s) within the areas targeted by this assay, and inadequate number of viral copies (<250 copies / mL). A negative result must be combined with clinical observations, patient history, and epidemiological information. Fact Sheet for Patients:   03/03/20 Fact Sheet for Healthcare Providers: BoilerBrush.com.cy This test is not yet approved or cleared  by the https://pope.com/ FDA and has been authorized for detection and/or diagnosis of SARS-CoV-2 by FDA under an Emergency Use Authorization (EUA).  This EUA will remain in effect (  meaning this test can be used) for the duration of the COVID-19 declaration under Section 564(b)(1) of the Act, 21 U.S.C. section 360bbb-3(b)(1), unless the authorization is terminated or revoked sooner. Performed at [facility], [address] [place], [place] [address]          Radiology Studies: No results found.      Scheduled Meds: . brimonidine  1 drop Both Eyes TID  . enoxaparin (LOVENOX) injection  40 mg Subcutaneous Q24H  . QUEtiapine  50 mg Oral QHS  . sertraline  25 mg Oral Daily  . timolol  1 drop Both Eyes BID   Continuous Infusions:   LOS: 7 days    Time spent: 25 mins.More than 50% of that time was spent in counseling and/or coordination of  care.      Burnadette Pop, MD [facility] P6/11/2019, 11:20 AM

## 2020-03-09 ENCOUNTER — Inpatient Hospital Stay (HOSPITAL_COMMUNITY): Payer: Self-pay

## 2020-03-09 LAB — SARS CORONAVIRUS 2 BY RT PCR (HOSPITAL ORDER, PERFORMED IN ~~LOC~~ HOSPITAL LAB): SARS Coronavirus 2: NEGATIVE

## 2020-03-09 NOTE — TOC Progression Note (Signed)
Transition of Care [facility]) - Progression Note    Patient Details  Name: Daymion Nazaire MRN: [id number] Date of Birth: 1956/08/22  Transition of Care [facility]) CM/SW Contact  [place], [place] Phone Number: 03/09/2020, 4:51 PM  Clinical Narrative:   CSW worked towards memory care placement for patient today. CSW completed nursing assessment of patient with Melissa via FaceTime, and confirmed that patient is appropriate for [facility]. Patient will need chest X-ray that does not show evidence of TB and an updated COVID test, both have been ordered. [facility] is able to admit patient tomorrow if wife is able to complete paperwork. CSW to follow.    Expected Discharge Plan: Skilled Nursing Facility Barriers to Discharge: Continued Medical Work up, Inadequate or no insurance, Unsafe home situation, SNF Pending payor source - LOG, SNF Pending bed offer  Expected Discharge Plan and Services Expected Discharge Plan: Skilled Nursing Facility In-house Referral: Clinical Social Work     Living arrangements for the past 2 months: Single Family Home                                       Social Determinants of Health (SDOH) Interventions    Readmission Risk Interventions No flowsheet data found.

## 2020-03-09 NOTE — Progress Notes (Signed)
PROGRESS NOTE    Micheal [name]  IRW:[id number] DOB: 1956/06/07 DOA: 03/01/2020 PCP: Kirby Funk, MD   Brief Narrative: 64 year old male with frontotemporal dementia and hypertension who was brought to the emergency room with history of worsening agitation and confusion for the past few weeks, decline in level of functioning, erratic behavior, agitation.No new changes in medications were noted or reported.  His hospital course remained stable.  Patient was seen by PT/OT and recommended skilled nursing facility on discharge.  Currently he is waiting for  placement.  Hemodynamically stable for discharge as soon as bed is available in memory care.  Assessment & Plan:   Principal Problem:   Frontotemporal dementia with behavioral disturbance (HCC) Active Problems:   Dehydration with hypernatremia   Essential hypertension  Agitation, confusion, behavioral problems Progressive frontotemporal dementia -Felt to be progression of patient's dementia, could be slightly worsened by hypernatremia -No evidence of acute infectious process  -Except for mildly elevated sodium, rest of labs were unremarkable, B12 and TSH are within normal limits,chest x-ray,urinalysis is unremarkable -CT head notes advanced atrophy premature for age -Psychiatry consulted for medical management, agreed with increasing p.m. dose of Seroquel to 50 mg nightly and recommended changing citalopram to sertraline and using Seroquel as needed for agitation -Clinically improving and more stable, without significant agitation, pleasantly confused with considerable cognitive deficits -PT OT eval completed, SNF/24-hour supervision recommended -Ideally needs long-term care  Dehydration, hypernatremia -Sodium improved, IV fluids discontinued -Diuretics discontinued  Hypertension -Blood pressure stable without meds, monitor, diuretics stopped  Hypokalemia -Replaced          DVT prophylaxis:Lovenox Code Status:  DNR Family Communication: Wife at the bedside on 03/08/20 Status is: Inpatient  Remains inpatient appropriate because:Unsafe d/c plan   Dispo: The patient is from: Home              Anticipated d/c is to: SNF              Anticipated d/c date is: 1 day              Patient currently is medically stable to d/c.      Consultants: None  Procedures:None  Antimicrobials:  Anti-infectives (From admission, onward)   None      Subjective: Patient seen and examined at the bedside this morning.  Hemodynamically stable.  Comfortable.  No active issues. Objective: Vitals:   03/08/20 1539 03/08/20 1941 03/08/20 2313 03/09/20 0400  BP: 138/83 136/83 114/76 122/79  Pulse: 78 98 80 82  Resp: 20 18 17 16   Temp: (!) 97.5 F (36.4 C) 98.1 F (36.7 C) 98.5 F (36.9 C) 98.7 F (37.1 C)  TempSrc: Oral Oral    SpO2: 98% 96% 94% 95%  Weight:      Height:        Intake/Output Summary (Last 24 hours) at 03/09/2020 0844 Last data filed at 03/09/2020 0818 Gross per 24 hour  Intake 360 ml  Output --  Net 360 ml   Filed Weights   03/01/20 0247  Weight: 86.2 kg    Examination:  General exam: Appears calm and comfortable ,Not in distress,average built HEENT:PERRL,Oral mucosa moist, Ear/Nose normal on gross exam Respiratory system: Bilateral equal air entry, normal vesicular breath sounds, no wheezes or crackles  Cardiovascular system: S1 & S2 heard, RRR. No JVD, murmurs, rubs, gallops or clicks. No pedal edema. Gastrointestinal system: Abdomen is nondistended, soft and nontender. No organomegaly or masses felt. Normal bowel sounds heard. Central nervous system: Alert and awake.  Not oriented.No focal neurological deficits. Extremities: No edema, no clubbing ,no cyanosis Skin: No rashes, lesions or ulcers,no icterus ,no pallor Psychiatry: Judgement and insight appear impaired     Data Reviewed: I have personally reviewed following labs and imaging studies  CBC: No results for  input(s): WBC, NEUTROABS, HGB, HCT, MCV, PLT in the last 168 hours. Basic Metabolic Panel: Recent Labs  Lab 03/02/20 1420 03/03/20 0432 03/03/20 0820 03/04/20 0752  NA 147* 145  --  145  K 3.1* 2.7*  --  3.7  CL 109 107  --  110  CO2 30 29  --  29  GLUCOSE 104* 105*  --  90  BUN 21 16  --  17  CREATININE 0.86 0.71  --  0.75  CALCIUM 8.2* 8.2*  --  8.4*  MG  --   --  2.0  --    GFR: Estimated Creatinine Clearance: 103.7 mL/min (by C-G formula based on SCr of 0.75 mg/dL). Liver Function Tests: No results for input(s): AST, ALT, ALKPHOS, BILITOT, PROT, ALBUMIN in the last 168 hours. No results for input(s): LIPASE, AMYLASE in the last 168 hours. No results for input(s): AMMONIA in the last 168 hours. Coagulation Profile: No results for input(s): INR, PROTIME in the last 168 hours. Cardiac Enzymes: No results for input(s): CKTOTAL, CKMB, CKMBINDEX, TROPONINI in the last 168 hours. BNP (last 3 results) No results for input(s): PROBNP in the last 8760 hours. HbA1C: No results for input(s): HGBA1C in the last 72 hours. CBG: No results for input(s): GLUCAP in the last 168 hours. Lipid Profile: No results for input(s): CHOL, HDL, LDLCALC, TRIG, CHOLHDL, LDLDIRECT in the last 72 hours. Thyroid Function Tests: No results for input(s): TSH, T4TOTAL, FREET4, T3FREE, THYROIDAB in the last 72 hours. Anemia Panel: No results for input(s): VITAMINB12, FOLATE, FERRITIN, TIBC, IRON, RETICCTPCT in the last 72 hours. Sepsis Labs: No results for input(s): PROCALCITON, LATICACIDVEN in the last 168 hours.  Recent Results (from the past 240 hour(s))  SARS Coronavirus 2 by RT PCR (hospital order, performed in [facility] hospital lab) Nasopharyngeal Nasopharyngeal Swab     Status: None   Collection Time: 03/01/20  7:58 AM   Specimen: Nasopharyngeal Swab  Result Value Ref Range Status   SARS Coronavirus 2 NEGATIVE NEGATIVE Final    Comment: (NOTE) SARS-CoV-2 target nucleic acids are NOT  DETECTED. The SARS-CoV-2 RNA is generally detectable in upper and lower respiratory specimens during the acute phase of infection. The lowest concentration of SARS-CoV-2 viral copies this assay can detect is 250 copies / mL. A negative result does not preclude SARS-CoV-2 infection and should not be used as the sole basis for treatment or other patient management decisions.  A negative result may occur with improper specimen collection / handling, submission of specimen other than nasopharyngeal swab, presence of viral mutation(s) within the areas targeted by this assay, and inadequate number of viral copies (<250 copies / mL). A negative result must be combined with clinical observations, patient history, and epidemiological information. Fact Sheet for Patients:   StrictlyIdeas.no Fact Sheet for Healthcare Providers: BankingDealers.co.za This test is not yet approved or cleared  by the [place] FDA and has been authorized for detection and/or diagnosis of SARS-CoV-2 by FDA under an Emergency Use Authorization (EUA).  This EUA will remain in effect (meaning this test can be used) for the duration of the COVID-19 declaration under Section 564(b)(1) of the Act, 21 U.S.C. section 360bbb-3(b)(1), unless the authorization is terminated or  revoked sooner. Performed at [facility], [address] [place], [place] [address]          Radiology Studies: No results found.      Scheduled Meds: . brimonidine  1 drop Both Eyes TID  . enoxaparin (LOVENOX) injection  40 mg Subcutaneous Q24H  . QUEtiapine  50 mg Oral QHS  . sertraline  25 mg Oral Daily  . timolol  1 drop Both Eyes BID   Continuous Infusions:   LOS: 8 days    Time spent: 25 mins.More than 50% of that time was spent in counseling and/or coordination of care.      Burnadette Pop, MD [facility] P6/12/2019, 8:44 AM

## 2020-03-10 NOTE — Progress Notes (Signed)
PROGRESS NOTE    Micheal [name]  DGU:[id number] DOB: Oct 16, 1955 DOA: 03/01/2020 PCP: Kirby Funk, MD   Brief Narrative: 64 year old male with frontotemporal dementia and hypertension who was brought to the emergency room with history of worsening agitation and confusion for the past few weeks, decline in level of functioning, erratic behavior, agitation.No new changes in medications were noted or reported.  His hospital course remained stable.  Patient was seen by PT/OT and recommended skilled nursing facility on discharge.  Currently he is waiting for  placement.  Hemodynamically stable for discharge as soon as bed is available in memory care.  Assessment & Plan:   Principal Problem:   Frontotemporal dementia with behavioral disturbance (HCC) Active Problems:   Dehydration with hypernatremia   Essential hypertension  Agitation, confusion, behavioral problems Progressive frontotemporal dementia -Felt to be progression of patient's dementia, could be slightly worsened by hypernatremia -No evidence of acute infectious process  -Except for mildly elevated sodium, rest of labs were unremarkable, B12 and TSH are within normal limits,chest x-ray,urinalysis is unremarkable -CT head notes advanced atrophy premature for age -Psychiatry consulted for medical management, agreed with increasing p.m. dose of Seroquel to 50 mg nightly and recommended changing citalopram to sertraline and using Seroquel as needed for agitation -Clinically improving and more stable, without significant agitation, pleasantly confused with considerable cognitive deficits -PT OT eval completed, SNF/24-hour supervision recommended -Ideally needs long-term care  Dehydration, hypernatremia -Sodium improved, IV fluids discontinued -Diuretics discontinued  Hypertension -Blood pressure stable without meds, monitor, diuretics stopped  Hypokalemia -Replaced          DVT prophylaxis:Lovenox Code Status:  DNR Family Communication: Wife at the bedside on 03/08/20 Status is: Inpatient  Remains inpatient appropriate because:Unsafe d/c plan   Dispo: The patient is from: Home              Anticipated d/c is to: SNF              Anticipated d/c date is: 1 day              Patient currently is medically stable to d/c.      Consultants: None  Procedures:None  Antimicrobials:  Anti-infectives (From admission, onward)   None      Subjective:  Patient seen and examined the bedside this morning.  Neurologically stable.  Comfortable.  No active issues.  Stable for discharge today. Objective: Vitals:   03/09/20 2007 03/09/20 2314 03/10/20 0431 03/10/20 0827  BP: 122/78 107/79 113/80 121/90  Pulse: (!) 101 76 68 79  Resp: 16 18 16 16   Temp: 98.1 F (36.7 C) 98 F (36.7 C) 98.9 F (37.2 C) 98 F (36.7 C)  TempSrc: Oral   Oral  SpO2: 96% 97% 97% 98%  Weight:      Height:        Intake/Output Summary (Last 24 hours) at 03/10/2020 1132 Last data filed at 03/09/2020 1802 Gross per 24 hour  Intake 240 ml  Output --  Net 240 ml   Filed Weights   03/01/20 0247  Weight: 86.2 kg    Examination:  General exam: Appears calm and comfortable ,Not in distress Respiratory system: Bilateral equal air entry, normal vesicular breath sounds, no wheezes or crackles  Cardiovascular system: S1 & S2 heard, RRR. No JVD, murmurs, rubs, gallops or clicks. Gastrointestinal system: Abdomen is nondistended, soft and nontender. No organomegaly or masses felt. Normal bowel sounds heard. Central nervous system: Alert and awake. Extremities: No edema, no clubbing ,no cyanosis  Skin: No rashes, lesions or ulcers,no icterus ,no pallor    Data Reviewed: I have personally reviewed following labs and imaging studies  CBC: No results for input(s): WBC, NEUTROABS, HGB, HCT, MCV, PLT in the last 168 hours. Basic Metabolic Panel: Recent Labs  Lab 03/04/20 0752  NA 145  K 3.7  CL 110  CO2 29  GLUCOSE  90  BUN 17  CREATININE 0.75  CALCIUM 8.4*   GFR: Estimated Creatinine Clearance: 103.7 mL/min (by C-G formula based on SCr of 0.75 mg/dL). Liver Function Tests: No results for input(s): AST, ALT, ALKPHOS, BILITOT, PROT, ALBUMIN in the last 168 hours. No results for input(s): LIPASE, AMYLASE in the last 168 hours. No results for input(s): AMMONIA in the last 168 hours. Coagulation Profile: No results for input(s): INR, PROTIME in the last 168 hours. Cardiac Enzymes: No results for input(s): CKTOTAL, CKMB, CKMBINDEX, TROPONINI in the last 168 hours. BNP (last 3 results) No results for input(s): PROBNP in the last 8760 hours. HbA1C: No results for input(s): HGBA1C in the last 72 hours. CBG: No results for input(s): GLUCAP in the last 168 hours. Lipid Profile: No results for input(s): CHOL, HDL, LDLCALC, TRIG, CHOLHDL, LDLDIRECT in the last 72 hours. Thyroid Function Tests: No results for input(s): TSH, T4TOTAL, FREET4, T3FREE, THYROIDAB in the last 72 hours. Anemia Panel: No results for input(s): VITAMINB12, FOLATE, FERRITIN, TIBC, IRON, RETICCTPCT in the last 72 hours. Sepsis Labs: No results for input(s): PROCALCITON, LATICACIDVEN in the last 168 hours.  Recent Results (from the past 240 hour(s))  SARS Coronavirus 2 by RT PCR (hospital order, performed in [facility] hospital lab) Nasopharyngeal Nasopharyngeal Swab     Status: None   Collection Time: 03/01/20  7:58 AM   Specimen: Nasopharyngeal Swab  Result Value Ref Range Status   SARS Coronavirus 2 NEGATIVE NEGATIVE Final    Comment: (NOTE) SARS-CoV-2 target nucleic acids are NOT DETECTED. The SARS-CoV-2 RNA is generally detectable in upper and lower respiratory specimens during the acute phase of infection. The lowest concentration of SARS-CoV-2 viral copies this assay can detect is 250 copies / mL. A negative result does not preclude SARS-CoV-2 infection and should not be used as the sole basis for treatment or  other patient management decisions.  A negative result may occur with improper specimen collection / handling, submission of specimen other than nasopharyngeal swab, presence of viral mutation(s) within the areas targeted by this assay, and inadequate number of viral copies (<250 copies / mL). A negative result must be combined with clinical observations, patient history, and epidemiological information. Fact Sheet for Patients:   BoilerBrush.com.cy Fact Sheet for Healthcare Providers: https://pope.com/ This test is not yet approved or cleared  by the [place] FDA and has been authorized for detection and/or diagnosis of SARS-CoV-2 by FDA under an Emergency Use Authorization (EUA).  This EUA will remain in effect (meaning this test can be used) for the duration of the COVID-19 declaration under Section 564(b)(1) of the Act, 21 U.S.C. section 360bbb-3(b)(1), unless the authorization is terminated or revoked sooner. Performed at [facility], [address], [place], [place] [address]   SARS Coronavirus 2 by RT PCR (hospital order, performed in [facility] hospital lab) Nasopharyngeal Nasopharyngeal Swab     Status: None   Collection Time: 03/09/20  3:03 PM   Specimen: Nasopharyngeal Swab  Result Value Ref Range Status   SARS Coronavirus 2 NEGATIVE NEGATIVE Final    Comment: (NOTE) SARS-CoV-2 target nucleic acids are  NOT DETECTED. The SARS-CoV-2 RNA is generally detectable in upper and lower respiratory specimens during the acute phase of infection. The lowest concentration of SARS-CoV-2 viral copies this assay can detect is 250 copies / mL. A negative result does not preclude SARS-CoV-2 infection and should not be used as the sole basis for treatment or other patient management decisions.  A negative result may occur with improper specimen collection / handling, submission of specimen other than nasopharyngeal swab, presence  of viral mutation(s) within the areas targeted by this assay, and inadequate number of viral copies (<250 copies / mL). A negative result must be combined with clinical observations, patient history, and epidemiological information. Fact Sheet for Patients:   StrictlyIdeas.no Fact Sheet for Healthcare Providers: BankingDealers.co.za This test is not yet approved or cleared  by the [place] FDA and has been authorized for detection and/or diagnosis of SARS-CoV-2 by FDA under an Emergency Use Authorization (EUA).  This EUA will remain in effect (meaning this test can be used) for the duration of the COVID-19 declaration under Section 564(b)(1) of the Act, 21 U.S.C. section 360bbb-3(b)(1), unless the authorization is terminated or revoked sooner. Performed at [facility], [place] [address], [place], [place] [address]          Radiology Studies: DG Chest Port 1 View  Result Date: 03/09/2020 CLINICAL DATA:  Screening for tuberculosis, altered level of consciousness EXAM: PORTABLE CHEST 1 VIEW COMPARISON:  03/01/2020 FINDINGS: Single frontal view of the chest excludes the lung apices by collimation. No airspace disease, effusion, or pneumothorax. No evidence of healed or active tuberculosis. Cardiac silhouette is unremarkable. IMPRESSION: 1. No acute intrathoracic process. Electronically Signed   By: Randa Ngo M.D.   On: 03/09/2020 16:53        Scheduled Meds: . brimonidine  1 drop Both Eyes TID  . enoxaparin (LOVENOX) injection  40 mg Subcutaneous Q24H  . QUEtiapine  50 mg Oral QHS  . sertraline  25 mg Oral Daily  . timolol  1 drop Both Eyes BID   Continuous Infusions:   LOS: 9 days    Time spent: 25 mins.More than 50% of that time was spent in counseling and/or coordination of care.      Shelly Coss, MD [facility] P6/01/2020, 11:32 AM

## 2020-03-10 NOTE — NC FL2 (Signed)
[place] LEVEL OF CARE SCREENING TOOL     IDENTIFICATION  Patient Name: Micheal [name] Birthdate: Jul 19, 1956 Sex: male Admission Date (Current Location): 03/01/2020  [facility] and [place] Number:  Herbalist and Address:  [facility] [place] [address] [place], [place] [address]      Provider Number: [id number]  Attending Physician Name and Address:  Shelly Coss, MD  Relative Name and Phone Number:  Rayson Rando, [phone]    Current Level of Care: Hospital Recommended Level of Care: Assisted Living Facility(ALF-SCU) Prior Approval Number:    Date Approved/Denied:   PASRR Number:    Discharge Plan: Other (Comment)(ALF-SCU)    Current Diagnoses: Patient Active Problem List   Diagnosis Date Noted  . Frontotemporal dementia with behavioral disturbance ([place]) 03/01/2020  . Dehydration with hypernatremia 03/01/2020  . Essential hypertension 03/01/2020    Orientation RESPIRATION BLADDER Height & Weight     Self  Normal Continent Weight: 190 lb (86.2 kg) Height:  6' (182.9 cm)  BEHAVIORAL SYMPTOMS/MOOD NEUROLOGICAL BOWEL NUTRITION STATUS      Continent Diet(See discharge summary)  AMBULATORY STATUS COMMUNICATION OF NEEDS Skin   Limited Assist Verbally Normal                       Personal Care Assistance Level of Assistance  Bathing, Feeding, Dressing Bathing Assistance: Limited assistance Feeding assistance: Independent Dressing Assistance: Limited assistance     Functional Limitations Info  Sight, Hearing, Speech Sight Info: Adequate Hearing Info: Adequate Speech Info: Adequate    SPECIAL CARE FACTORS FREQUENCY                       Contractures Contractures Info: Not present    Additional Factors Info  Code Status, Allergies, Psychotropic Code Status Info: DNR Allergies Info: NKA Psychotropic Info: Seroquel 50mg  daily at bed, Zoloft 25mg  daily         Current Medications (03/10/2020):   This is the current hospital active medication list Current Facility-Administered Medications  Medication Dose Route Frequency Provider Last Rate Last Admin  . acetaminophen (TYLENOL) tablet 650 mg  650 mg Oral Q6H PRN Shalhoub, Sherryll Burger, MD       Or  . acetaminophen (TYLENOL) suppository 650 mg  650 mg Rectal Q6H PRN Shalhoub, Sherryll Burger, MD      . brimonidine (ALPHAGAN) 0.2 % ophthalmic solution 1 drop  1 drop Both Eyes TID Shalhoub, Sherryll Burger, MD   1 drop at 03/10/20 0959  . enalaprilat (VASOTEC) injection 1.25 mg  1.25 mg Intravenous Q6H PRN Shalhoub, Sherryll Burger, MD      . enoxaparin (LOVENOX) injection 40 mg  40 mg Subcutaneous Q24H Shalhoub, Sherryll Burger, MD   40 mg at 03/10/20 1093  . haloperidol lactate (HALDOL) injection 2 mg  2 mg Intravenous Q6H PRN Domenic Polite, MD      . LORazepam (ATIVAN) tablet 0.5 mg  0.5 mg Oral Q6H PRN Domenic Polite, MD   0.5 mg at 03/09/20 1810  . polyethylene glycol (MIRALAX / GLYCOLAX) packet 17 g  17 g Oral Daily PRN Vernelle Emerald, MD      . QUEtiapine (SEROQUEL) tablet 50 mg  50 mg Oral QHS Domenic Polite, MD   50 mg at 03/09/20 2119  . sertraline (ZOLOFT) tablet 25 mg  25 mg Oral Daily Domenic Polite, MD   25 mg at 03/10/20 0958  . timolol (TIMOPTIC) 0.5 % ophthalmic  solution 1 drop  1 drop Both Eyes BID Zannie Cove, MD   1 drop at 03/10/20 0959     Discharge Medications: acetaminophen 325 MG tablet Commonly known as: TYLENOL Take 2 tablets (650 mg total) by mouth every 6 (six) hours as needed for mild pain (or Fever >/= 101).   brimonidine 0.2 % ophthalmic solution Commonly known as: ALPHAGAN 3 (three) times daily.   polyethylene glycol 17 g packet Commonly known as: MIRALAX / GLYCOLAX Take 17 g by mouth daily as needed for mild constipation.   QUEtiapine 50 MG tablet Commonly known as: SEROquel Take 1 tablet (50 mg total) by mouth at bedtime. What changed:   medication strength  how much to take   QUEtiapine 25 MG  tablet Commonly known as: SEROquel Take 1 tablet (25 mg total) by mouth 2 (two) times daily as needed (agitation). What changed: You were already taking a medication with the same name, and this prescription was added. Make sure you understand how and when to take each.   sertraline 25 MG tablet Commonly known as: ZOLOFT Take 1 tablet (25 mg total) by mouth daily.   timolol 0.5 % ophthalmic solution Commonly known as: TIMOPTIC 1 drop 2 (two) times daily.     Relevant Imaging Results:  Relevant Lab Results:   Additional Information SS# [id number]  Baldemar Lenis, LCSW

## 2020-03-10 NOTE — Plan of Care (Signed)
  Problem: Activity: Goal: Risk for activity intolerance will decrease Outcome: Progressing   Problem: Nutrition: Goal: Adequate nutrition will be maintained Outcome: Progressing   Problem: Coping: Goal: Level of anxiety will decrease Outcome: Progressing   

## 2020-03-10 NOTE — Progress Notes (Signed)
Reported called to [place] at [facility] and informed her that [facility] had been called. All questions answered. Patient personal belongings collected from the room by patient's wife. This author checked closet and drawers to confirmed all belongings had been collected. [facility] has arrived to transport patient to [facility] at this time.

## 2020-03-10 NOTE — TOC Transition Note (Signed)
Transition of Care [facility]) - CM/SW Discharge Note   Patient Details  Name: Braven Wolk MRN: [id number] Date of Birth: 06-07-1956  Transition of Care [facility]) CM/SW Contact:  Baldemar Lenis, LCSW Phone Number: 03/10/2020, 3:00 PM   Clinical Narrative:   Nurse to call report to [facility], [phone]    Final next level of care: Assisted Living Barriers to Discharge: Barriers Resolved   Patient Goals and CMS Choice Patient states their goals for this hospitalization and ongoing recovery are:: Pt's wife states she would like for him to go to SNF, but they do not have insurance. CMS Medicare.gov Compare Post Acute Care list provided to:: Patient Represenative (must comment)(Spouse, Doris) Choice offered to / list presented to : Spouse  Discharge Placement              Patient chooses bed at: [facility]) Patient to be transferred to facility by: [facility] Name of family member notified: Doris Patient and family notified of of transfer: 03/10/20  Discharge Plan and Services In-house Referral: Clinical Social Work                                   Social Determinants of Health (SDOH) Interventions     Readmission Risk Interventions No flowsheet data found.

## 2020-05-29 ENCOUNTER — Telehealth: Payer: Self-pay | Admitting: Neurology

## 2020-05-29 NOTE — Telephone Encounter (Signed)
Attorney Toma Copier Boring called in. She needs to verify the authenticity of a letter in this patient's file.

## 2020-05-30 NOTE — Telephone Encounter (Signed)
Left message with Clois Dupes Boring requesting a call back about letter needing to be verified.

## 2020-06-02 ENCOUNTER — Telehealth: Payer: Self-pay | Admitting: Neurology

## 2020-06-02 NOTE — Telephone Encounter (Signed)
Patient's wife called and left a message wanting to let Dr. Karel Jarvis know the patient's long-term care facility (no name of facility left) is to call early next week regarding the patient's behavior. She'd like the doctor to speak with them, if possible, when they call.

## 2020-06-26 ENCOUNTER — Ambulatory Visit: Payer: Self-pay | Admitting: Neurology

## 2021-02-05 ENCOUNTER — Emergency Department (HOSPITAL_COMMUNITY)
Admission: EM | Admit: 2021-02-05 | Discharge: 2021-02-07 | Disposition: A | Discharge status: Home / Self Care | Payer: 59 | Attending: Emergency Medicine | Admitting: Emergency Medicine

## 2021-02-05 ENCOUNTER — Encounter (HOSPITAL_COMMUNITY): Payer: Self-pay

## 2021-02-05 DIAGNOSIS — Z20822 Contact with and (suspected) exposure to covid-19: Secondary | ICD-10-CM | POA: Insufficient documentation

## 2021-02-05 DIAGNOSIS — R4689 Other symptoms and signs involving appearance and behavior: Secondary | ICD-10-CM

## 2021-02-05 DIAGNOSIS — R410 Disorientation, unspecified: Secondary | ICD-10-CM | POA: Insufficient documentation

## 2021-02-05 DIAGNOSIS — I10 Essential (primary) hypertension: Secondary | ICD-10-CM | POA: Insufficient documentation

## 2021-02-05 DIAGNOSIS — R451 Restlessness and agitation: Secondary | ICD-10-CM | POA: Insufficient documentation

## 2021-02-05 DIAGNOSIS — R456 Violent behavior: Secondary | ICD-10-CM | POA: Diagnosis not present

## 2021-02-05 DIAGNOSIS — F0391 Unspecified dementia with behavioral disturbance: Secondary | ICD-10-CM | POA: Diagnosis not present

## 2021-02-05 DIAGNOSIS — Z046 Encounter for general psychiatric examination, requested by authority: Secondary | ICD-10-CM | POA: Diagnosis present

## 2021-02-05 LAB — CBC WITH DIFFERENTIAL/PLATELET
Abs Immature Granulocytes: 0.03 10*3/uL (ref 0.00–0.07)
Basophils Absolute: 0 10*3/uL (ref 0.0–0.1)
Basophils Relative: 0 %
Eosinophils Absolute: 0.2 10*3/uL (ref 0.0–0.5)
Eosinophils Relative: 2 %
HCT: 45.6 % (ref 39.0–52.0)
Hemoglobin: 14.8 g/dL (ref 13.0–17.0)
Immature Granulocytes: 0 %
Lymphocytes Relative: 26 %
Lymphs Abs: 2 10*3/uL (ref 0.7–4.0)
MCH: 32.3 pg (ref 26.0–34.0)
MCHC: 32.5 g/dL (ref 30.0–36.0)
MCV: 99.6 fL (ref 80.0–100.0)
Monocytes Absolute: 0.6 10*3/uL (ref 0.1–1.0)
Monocytes Relative: 8 %
Neutro Abs: 4.9 10*3/uL (ref 1.7–7.7)
Neutrophils Relative %: 64 %
Platelets: 151 10*3/uL (ref 150–400)
RBC: 4.58 MIL/uL (ref 4.22–5.81)
RDW: 12.7 % (ref 11.5–15.5)
WBC: 7.7 10*3/uL (ref 4.0–10.5)
nRBC: 0 % (ref 0.0–0.2)

## 2021-02-05 LAB — COMPREHENSIVE METABOLIC PANEL
ALT: 26 U/L (ref 0–44)
AST: 21 U/L (ref 15–41)
Albumin: 4 g/dL (ref 3.5–5.0)
Alkaline Phosphatase: 67 U/L (ref 38–126)
Anion gap: 9 (ref 5–15)
BUN: 17 mg/dL (ref 8–23)
CO2: 28 mmol/L (ref 22–32)
Calcium: 8.8 mg/dL — ABNORMAL LOW (ref 8.9–10.3)
Chloride: 106 mmol/L (ref 98–111)
Creatinine, Ser: 0.91 mg/dL (ref 0.61–1.24)
GFR, Estimated: >60 mL/min (ref >60)
Glucose, Bld: 102 mg/dL — ABNORMAL HIGH (ref 70–99)
Potassium: 3.8 mmol/L (ref 3.5–5.1)
Sodium: 143 mmol/L (ref 135–145)
Total Bilirubin: 0.5 mg/dL (ref 0.3–1.2)
Total Protein: 6.7 g/dL (ref 6.5–8.1)

## 2021-02-05 LAB — ETHANOL: Alcohol, Ethyl (B): <10 mg/dL (ref <10)

## 2021-02-05 LAB — ACETAMINOPHEN LEVEL: Acetaminophen (Tylenol), Serum: <10 ug/mL — ABNORMAL LOW (ref 10–30)

## 2021-02-05 LAB — SALICYLATE LEVEL: Salicylate Lvl: <7 mg/dL — ABNORMAL LOW (ref 7.0–30.0)

## 2021-02-05 MED ORDER — ACETAMINOPHEN 325 MG PO TABS: 650.0000 mg | ORAL_TABLET | Freq: Four times a day (QID) | ORAL | Status: DC | PRN

## 2021-02-05 MED ORDER — SERTRALINE HCL 25 MG PO TABS
25.0000 mg | ORAL_TABLET | Freq: Every day | ORAL | Status: DC
  Administered 2021-02-05 – 2021-02-07 (×2): 25 mg via ORAL
  Filled 2021-02-05 (×3): qty 1

## 2021-02-05 MED ORDER — QUETIAPINE FUMARATE 25 MG PO TABS
25.0000 mg | ORAL_TABLET | Freq: Two times a day (BID) | ORAL | Status: DC | PRN
  Filled 2021-02-05: qty 1

## 2021-02-05 MED ORDER — QUETIAPINE FUMARATE 50 MG PO TABS
50.0000 mg | ORAL_TABLET | Freq: Every day | ORAL | Status: DC
  Administered 2021-02-05 – 2021-02-07 (×2): 50 mg via ORAL
  Filled 2021-02-05 (×3): qty 1

## 2021-02-05 NOTE — ED Provider Notes (Signed)
[facility] EMERGENCY DEPARTMENT Provider Note   CSN: [id number] Arrival date & time: 02/05/21  1611     History Chief Complaint  Patient presents with  . Aggressive Behavior    Micheal [name] is a 65 y.o. male hx of HTN, dementia, here presenting with aggressive behavior.  Patient has history of dementia and is in assisted living.  Per the facility, patient has been more aggressive and agitated.  He has been hitting others and also refusing to take his meds.  They felt that patient may benefit from [facility] psych unit.  Patient has no complaints.  He appears demented and very inconsistent with his history.  The history is provided by the patient.   Level V caveat- dementia     Past Medical History:  Diagnosis Date  . Hypertension     Patient Active Problem List   Diagnosis Date Noted  . Frontotemporal dementia with behavioral disturbance (HCC) 03/01/2020  . Dehydration with hypernatremia 03/01/2020  . Essential hypertension 03/01/2020    No past surgical history on file.     Family History  Problem Relation Age of Onset  . Dementia Father   . Parkinsonism Father   . Dementia Paternal Aunt   . Dementia Paternal Uncle   . Dementia Paternal Grandfather     Social History   Tobacco Use  . Smoking status: Never Smoker  . Smokeless tobacco: Never Used  Vaping Use  . Vaping Use: Never used  Substance Use Topics  . Alcohol use: Never  . Drug use: Never    Home Medications Prior to Admission medications   Medication Sig Start Date End Date Taking? Authorizing Provider  acetaminophen (TYLENOL) 325 MG tablet Take 2 tablets (650 mg total) by mouth every 6 (six) hours as needed for mild pain (or Fever >/= 101). 03/07/20   Zannie Cove, MD  brimonidine (ALPHAGAN) 0.2 % ophthalmic solution 3 (three) times daily.    [provider]  polyethylene glycol (MIRALAX / GLYCOLAX) 17 g packet Take 17 g by mouth daily as needed for mild constipation.  03/07/20   Zannie Cove, MD  QUEtiapine (SEROQUEL) 25 MG tablet Take 1 tablet (25 mg total) by mouth 2 (two) times daily as needed (agitation). 03/07/20   Zannie Cove, MD  QUEtiapine (SEROQUEL) 50 MG tablet Take 1 tablet (50 mg total) by mouth at bedtime. 03/07/20   Zannie Cove, MD  sertraline (ZOLOFT) 25 MG tablet Take 1 tablet (25 mg total) by mouth daily. 03/08/20   Zannie Cove, MD  timolol (TIMOPTIC) 0.5 % ophthalmic solution 1 drop 2 (two) times daily.    [provider]    Allergies    Patient has no known allergies.  Review of Systems   Review of Systems  Psychiatric/Behavioral: Positive for agitation.  All other systems reviewed and are negative.   Physical Exam Updated Vital Signs BP (!) 141/112 (BP Location: Left Arm)   Pulse (!) 101   Temp 98 F (36.7 C) (Oral)   Resp 16   SpO2 98%   Physical Exam Vitals and nursing note reviewed.  Constitutional:      Comments: Demented, calm currently  HENT:     Head: Normocephalic.     Nose: Nose normal.     Mouth/Throat:     Mouth: Mucous membranes are moist.  Eyes:     Extraocular Movements: Extraocular movements intact.     Pupils: Pupils are equal, round, and reactive to light.  Cardiovascular:  Rate and Rhythm: Normal rate and regular rhythm.     Pulses: Normal pulses.     Heart sounds: Normal heart sounds.  Pulmonary:     Effort: Pulmonary effort is normal.     Breath sounds: Normal breath sounds.  Abdominal:     General: Abdomen is flat.     Palpations: Abdomen is soft.  Musculoskeletal:        General: Normal range of motion.     Cervical back: Normal range of motion and neck supple.  Skin:    General: Skin is warm.     Capillary Refill: Capillary refill takes less than 2 seconds.  Neurological:     Comments: Demented and disoriented, patient able to ambulate by himself.  Patient has normal strength bilateral arms and legs  Psychiatric:        Mood and Affect: Mood normal.     ED  Results / Procedures / Treatments   Labs (all labs ordered are listed, but only abnormal results are displayed) Labs Reviewed  COMPREHENSIVE METABOLIC PANEL - Abnormal; Notable for the following components:      Result Value   Glucose, Bld 102 (*)    Calcium 8.8 (*)    All other components within normal limits  SALICYLATE LEVEL - Abnormal; Notable for the following components:   Salicylate Lvl <7.0 (*)    All other components within normal limits  ACETAMINOPHEN LEVEL - Abnormal; Notable for the following components:   Acetaminophen (Tylenol), Serum <10 (*)    All other components within normal limits  SARS CORONAVIRUS 2 (TAT 6-24 HRS)  CBC WITH DIFFERENTIAL/PLATELET  ETHANOL  RAPID URINE DRUG SCREEN, HOSP PERFORMED    EKG None  Radiology No results found.  Procedures Procedures   Medications Ordered in ED Medications  acetaminophen (TYLENOL) tablet 650 mg (has no administration in time range)  QUEtiapine (SEROQUEL) tablet 25 mg (has no administration in time range)  QUEtiapine (SEROQUEL) tablet 50 mg (has no administration in time range)  sertraline (ZOLOFT) tablet 25 mg (has no administration in time range)    ED Course  I have reviewed the triage vital signs and the nursing notes.  Pertinent labs & imaging results that were available during my care of the patient were reviewed by me and considered in my medical decision making (see chart for details).    MDM Rules/Calculators/A&P                         Micheal [name] is a 65 y.o. male history of dementia here presenting with aggressive behavior.  Unclear if he has dementia with behavioral disturbance or primary psych issue.  He is under IVC and I filled out first exam.  We will consult TTS and get medical clearance labs   8:23 PM Labs unremarkable.  Medically cleared for psych eval.  Home meds ordered   Final Clinical Impression(s) / ED Diagnoses Final diagnoses:  None    Rx / DC Orders ED Discharge  Orders    None       Charlynne Pander, MD 02/05/21 2023

## 2021-02-05 NOTE — ED Notes (Signed)
ED Provider at bedside. 

## 2021-02-05 NOTE — BH Assessment (Signed)
TTS clinician attempted assessment. Per Gabriel Rung, RN, unable to [employer]. TTS will attempt assessment at later time.

## 2021-02-05 NOTE — ED Triage Notes (Signed)
Brought from [facility], violent aggressive behavior towards staff and residence, not sleeping, confused, IVC'd.

## 2021-02-06 LAB — SARS CORONAVIRUS 2 (TAT 6-24 HRS): SARS Coronavirus 2: NEGATIVE

## 2021-02-06 MED ORDER — LORAZEPAM 1 MG PO TABS
1.0000 mg | ORAL_TABLET | Freq: Once | ORAL | Status: DC
  Filled 2021-02-06: qty 1

## 2021-02-06 MED ORDER — LORAZEPAM 2 MG/ML IJ SOLN
1.0000 mg | Freq: Once | INTRAMUSCULAR | Status: AC
  Administered 2021-02-06: 1 mg via INTRAMUSCULAR
  Filled 2021-02-06: qty 1

## 2021-02-06 MED ORDER — ZIPRASIDONE MESYLATE 20 MG IM SOLR
10.0000 mg | Freq: Once | INTRAMUSCULAR | Status: AC
  Administered 2021-02-07: 10 mg via INTRAMUSCULAR
  Filled 2021-02-06: qty 20

## 2021-02-06 MED ORDER — STERILE WATER FOR INJECTION IJ SOLN
INTRAMUSCULAR | Status: AC
  Filled 2021-02-06: qty 10

## 2021-02-06 MED ORDER — ZIPRASIDONE MESYLATE 20 MG IM SOLR
10.0000 mg | Freq: Once | INTRAMUSCULAR | Status: AC
  Administered 2021-02-06: 10 mg via INTRAMUSCULAR
  Filled 2021-02-06: qty 20

## 2021-02-06 NOTE — Evaluation (Signed)
Physical Therapy Evaluation and Discharge Patient Details Name: Micheal [name] MRN: [id number] DOB: 1956/09/15 Today's Date: 02/06/2021   History of Present Illness  Pt is a 65 y/o male presenting to the ED from ALF on 5/2 secondary to aggressive behavior. PMH includes HTN and dementia.  Clinical Impression  Patient evaluated by Physical Therapy with no further acute PT needs identified. All education has been completed and the patient has no further questions. Pt pleasantly confused with nonsensical speech throughout. Pt with dementia at baseline. Requiring supervision for safety during mobility. Was able to bend down and pick up objects on floor without LOB. Pt currently from ALF, but per notes, concerns that ALF may not be providing necessary care. May benefit from memory care as a long term plan should ALF be unable to provide necessary care. Do not feel pt requires PT follow up at this time as he is likely close to his baseline.  See below for any follow-up Physical Therapy or equipment needs. PT is signing off. Thank you for this referral. If needs change, please re-consult.      Follow Up Recommendations No PT follow up;Supervision/Assistance - 24 hour (return to ALF vs memory care)    Equipment Recommendations  None recommended by PT    Recommendations for Other Services       Precautions / Restrictions Precautions Precautions: Fall Restrictions Weight Bearing Restrictions: No      Mobility  Bed Mobility               General bed mobility comments: Sitting EOB upon entry    Transfers Overall transfer level: Needs assistance Equipment used: None Transfers: Sit to/from Stand Sit to Stand: Supervision         General transfer comment: Supervision for safety.  Ambulation/Gait Ambulation/Gait assistance: Supervision Gait Distance (Feet): 50 Feet Assistive device: None;1 person hand held assist Gait Pattern/deviations: Step-through pattern;Decreased stride  length Gait velocity: Decreased   General Gait Details: No LOB noted during gait. Pt responding well to hand holding to guide pt during gait. No LOB noted. Pt easily distracted and required redirection.  Stairs            Wheelchair Mobility    Modified Rankin (Stroke Patients Only)       Balance Overall balance assessment: Mild deficits observed, not formally tested (Pt able to bend down to pick up object on floor without LOB)                                           Pertinent Vitals/Pain Pain Assessment: Faces Faces Pain Scale: No hurt    Home Living Family/patient expects to be discharged to:: Assisted living                 Additional Comments: Per notes, pt from ALF.    Prior Function Level of Independence: Needs assistance         Comments: Unable to determine PLOF, but assume he likely required supervision for safety with ADLs and mobility.     Hand Dominance        Extremity/Trunk Assessment   Upper Extremity Assessment Upper Extremity Assessment: Overall WFL for tasks assessed    Lower Extremity Assessment Lower Extremity Assessment: Overall WFL for tasks assessed    Cervical / Trunk Assessment Cervical / Trunk Assessment: Normal  Communication   Communication: Expressive difficulties  Cognition Arousal/Alertness:  Awake/alert Behavior During Therapy: Flat affect Overall Cognitive Status: History of cognitive impairments - at baseline                                 General Comments: Dementia at baseline. Nonsensical speech throughout, but was able to follow some one step commands and able to tell PT his name. Likely close to his baseline      General Comments      Exercises     Assessment/Plan    PT Assessment Patent does not need any further PT services  PT Problem List         PT Treatment Interventions      PT Goals (Current goals can be found in the Care Plan section)  Acute Rehab  PT Goals PT Goal Formulation: Patient unable to participate in goal setting Time For Goal Achievement: 02/06/21 Potential to Achieve Goals: Good    Frequency     Barriers to discharge        Co-evaluation               AM-PAC PT "6 Clicks" Mobility  Outcome Measure Help needed turning from your back to your side while in a flat bed without using bedrails?: None Help needed moving from lying on your back to sitting on the side of a flat bed without using bedrails?: None Help needed moving to and from a bed to a chair (including a wheelchair)?: A Little Help needed standing up from a chair using your arms (e.g., wheelchair or bedside chair)?: A Little Help needed to walk in hospital room?: A Little Help needed climbing 3-5 steps with a railing? : A Little 6 Click Score: 20    End of Session   Activity Tolerance: Patient tolerated treatment well Patient left: in bed;with call bell/phone within reach;with nursing/sitter in room (sitting EOB) Nurse Communication: Mobility status PT Visit Diagnosis: Other abnormalities of gait and mobility (R26.89)    Time: 0102-7253 PT Time Calculation (min) (ACUTE ONLY): 18 min   Charges:   PT Evaluation $PT Eval Moderate Complexity: 1 Mod          Cindee Salt, DPT  Acute Rehabilitation Services  Pager: [phone] Office: [phone]   Lehman Prom 02/06/2021, 2:47 PM

## 2021-02-06 NOTE — ED Notes (Signed)
Pt's wife requested to see pt.  I met her in the ED lobby and explained to her that this was not possible. She understood.  She told me that she feels like that he does not receive his meds in the assisted living facility where he resides.  She states she goes to see him every day but that he will not accept meds from her.  She states that she  feels like the assisted living facility is not honest with her regarding him taking meds.  They tell her that he takes them.  She requested that [facility] assist her in getting him placed in a SNF.  I informed S. Posey Pronto, [place] of wife's request.

## 2021-02-06 NOTE — ED Provider Notes (Signed)
Emergency Medicine Observation Re-evaluation Note  Micheal [name] is a 65 y.o. male, seen on rounds today.  Pt initially presented to the ED for complaints of Aggressive Behavior Currently, the patient is under IVC.   Physical Exam  BP (!) 142/90 (BP Location: Left Arm)   Pulse 71   Temp 98 F (36.7 C) (Oral)   Resp 16   SpO2 98%  Physical Exam General: sleeping Cardiac: regular rate  Lungs: normal effort  Psych: calm  ED Course / MDM  EKG:   I have reviewed the labs performed to date as well as medications administered while in observation.  Recent changes in the last 24 hours include Geodon for agitation.   Plan  Current plan is awaiting TTS. Patient is under full IVC at this time.   Farrel Gordon, PA-C 02/06/21 2637    Little, Ambrose Finland, MD 02/09/21 [phone]

## 2021-02-06 NOTE — ED Notes (Signed)
Refused his zoloft

## 2021-02-06 NOTE — ED Notes (Signed)
Patient currently awake and wandering around purple zone. Attempting to leave through side exit doors. Patient refusing to go back into room. Security and Dr. Manus Gunning notified

## 2021-02-06 NOTE — ED Notes (Signed)
PT returned call . Will see pt asap

## 2021-02-06 NOTE — ED Notes (Signed)
P.T. in for eval

## 2021-02-06 NOTE — ED Notes (Signed)
Pt given sandwich bag with ginger ale & water at this time.

## 2021-02-06 NOTE — ED Notes (Signed)
Pt sleeping in bed. This RN and pt's clinical sitter changed pt into clean scrub pants and changed bed linens. New blanket placed on pt.

## 2021-02-06 NOTE — ED Notes (Signed)
Pt urinary incontinent, pacing in room, and not allowing staff to clean him or change into clean purple scrubs. Pt upset and arguing with staff incomprehensibly r/t dementia. Staff attempted to calm pt with verbal de-escalation without success.

## 2021-02-06 NOTE — BH Assessment (Signed)
TTS attempted to see patient.  However, patient's nurse states that patient has been sedated and is sleeping.

## 2021-02-06 NOTE — ED Notes (Signed)
I called acute care therapy and left a message for PT to return my call

## 2021-02-06 NOTE — ED Notes (Addendum)
Not able to do a psych assessment due to pt only answers one question and that question is "what is your name?" He does know his name only.  He does not appear to process any other questions that you ask him

## 2021-02-06 NOTE — ED Notes (Signed)
I cas morning.  She is very supportive and understands why he does not qualify for a SNF.  She hopes the pt does not have to return to [facility].  She is going to try and find a memory care facility that she approves of. lled pt's wife of 40 years to update her per her request from [facility]

## 2021-02-06 NOTE — ED Notes (Signed)
I left a message with [name] at [facility] to please call me back regarding wife's request for pt to go to [facility] upon his discharge.  I also spoke with Shayla [phone]) the social worker regarding wife's request and gave her Debbie's phone number at [facility].

## 2021-02-06 NOTE — ED Notes (Addendum)
Pt's wife called and wants him to go to [facility] [phone]. Wife understands that [facility] may not be able to d/c pt from here to [facility] and that pt may have  to be discharged back to [facility] first.  I did offer to call [facility] for her and inform them of her requests.
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
## 2021-02-06 NOTE — ED Notes (Signed)
Pt currently agitated. refusing for vitals to be taken at this time

## 2021-02-07 ENCOUNTER — Other Ambulatory Visit: Payer: Self-pay

## 2021-02-07 MED ORDER — QUETIAPINE FUMARATE 25 MG PO TABS
25.0000 mg | ORAL_TABLET | Freq: Two times a day (BID) | ORAL | Status: DC
  Administered 2021-02-07 (×2): 25 mg via ORAL
  Filled 2021-02-07 (×2): qty 1

## 2021-02-07 NOTE — BH Assessment (Signed)
TTS attempted to completed assessment with pt this morning at 0740. Pt is not willing/unable to participate in assessment.
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
## 2021-02-07 NOTE — ED Notes (Signed)
The pt  Keeps attempting to go out of his room and into other rooms

## 2021-02-07 NOTE — ED Notes (Signed)
Pt walking out of room urinating down his pants. Pt able to be redirected with multiple staff helping. Security at beside to help change pt. Linens changed, adult diaper and new pants placed on pt.

## 2021-02-07 NOTE — ED Notes (Signed)
Pt in bed he appears to be  Sleeping with his head covered

## 2021-02-07 NOTE — ED Notes (Signed)
Pt up wandering around in another pts room

## 2021-02-07 NOTE — ED Notes (Signed)
Report called to [facility] by the 7-3 rnckenzie

## 2021-02-07 NOTE — ED Notes (Addendum)
Pt is ambulatory but would not get up and move to stretcher.  Dr. Denton Lank consulted.  He came to lay eyes on pt and ensure that transfer went smoothly.  Attempted to give night dose of Seroquel prior to transport but pt was uncooperative.    Someone from [facility] had called 5 min before [facility]AR arrived requesting that we try to coordinated an IM style medication that might last a few days between the psych NP and their Dr. Nolon Rod.  Unfortunately, I was unable to coordinate before the pt needed to be transported.

## 2021-02-07 NOTE — ED Notes (Signed)
Attempted to give pt a bedside bath and change clothes. Pt stated he would like to do it on his own. Left new scrubs, washcloth, and towel at the bedside. Changed linen.

## 2021-02-07 NOTE — BH Assessment (Signed)
TTS attempted to contact pt wife for collateral information. HIPPA compliant message left on [facility].

## 2021-02-07 NOTE — ED Notes (Signed)
Pt wandering outside of room again and attempting to go into main ED hallway. Unable to re-direct without pt becoming combative. Security called back to bedside

## 2021-02-07 NOTE — ED Provider Notes (Addendum)
Emergency Medicine Observation Re-evaluation Note  Micheal [name] is a 65 y.o. male, seen on rounds today.  Pt initially presented to the ED for complaints of Aggressive Behavior Currently, the patient is resting.  Physical Exam  BP 140/77   Pulse 92   Temp 98.5 F (36.9 C)   Resp 18   SpO2 94%  Physical Exam General: nad Lungs: no respiratory distress Psych: calm  ED Course / MDM  EKG:   I have reviewed the labs performed to date as well as medications administered while in observation.  Recent changes in the last 24 hours include BH unable to assess patient this morning due to sedation and/or cooperation.  Plan  Current plan is for [facility] assessment. Patient is under full IVC at this time.   [name], MD 02/07/21 [phone]  Patient has been seen and assessed by behavioral health.  I was notified that he is cleared for discharge back to his facility.  He will not be held in the ED as alternative placement is arranged.  This can be handled by his current facility.  IVC will be reversed.   [name], MD 02/07/21 1149

## 2021-02-07 NOTE — ED Notes (Signed)
Pt currently sitting on the bed in urine saturated clothing. This RN along with another RN attempted to give the pt a bed bath but pt was non-compliant and stated he wanted to change his clothes himself. Supplies left at bedside but pt making no effort to change clothes.

## 2021-02-07 NOTE — ED Notes (Signed)
Pt ambulated self into hall and began to walk towards doors into main ED hallway. Redirected by staff into room. Pt again began to walk back self out of room with urinary incontinence down pant-leg. RN and sitter staff attempted to change pt. Pt became physically combative and refused care. Security called to pt bedside for assistance. With security at bedside, RN cleaned pt and changed pt into clean pant scrubs

## 2021-02-07 NOTE — BH Assessment (Signed)
Per Assunta Found, NP, pt is psychiatrically cleared.   TTS obtained collateral from patient wife Nichalos Brenton who reports that patient did not have any prior mental health diagnosis before being diagnosed with dementia. Wife reports that patient was moved from a facility in [facility] to [facility] about 3-4 weeks ago. Wife reports that the facility in [place] would not let her visit that often so she had patient moved to [facility] because it was closer and she could visit more. Wife reports that she noticed changes in pt behavior immediately. Wife reports that nurse at facility gave pt medications in a cup and pt would only take a few of his medications and leave the others in the cup. Wife reports the nurse told her that pt was not taking his Seroquel and this has been going on for a few weeks. Wife reports that [facility] is suppose to be a memory care facility however wife states "but its not". Wife is in the process of locating a memory care facility and is aware that pt was psychiatrically cleared today and will return to [facility] when discharged from ED.

## 2021-02-07 NOTE — ED Notes (Signed)
Pt urinating in corner of room

## 2021-02-07 NOTE — NC FL2 (Signed)
[place] MEDICAID FL2 LEVEL OF CARE SCREENING TOOL     IDENTIFICATION  Patient Name: Micheal [name] Birthdate: 10/20/1955 Sex: male Admission Date (Current Location): 02/05/2021  [facility] and [place] Number:  Producer, television/film/video and Address:  [facility], [address] [place], [place] [address]      Provider Number: [id number]  Attending Physician Name and Address:  Default, Provider, MD  Relative Name and Phone Number:  Khyri Hinzman, [phone]    Current Level of Care: Hospital (Lives at [facility]) Recommended Level of Care: Memory Care Prior Approval Number:    Date Approved/Denied:   PASRR Number:    Discharge Plan: Other (Comment) [facility], Assisted Living Facility)    Current Diagnoses: Patient Active Problem List   Diagnosis Date Noted  . Frontotemporal dementia with behavioral disturbance (HCC) 03/01/2020  . Dehydration with hypernatremia 03/01/2020  . Essential hypertension 03/01/2020    Orientation RESPIRATION BLADDER Height & Weight     Self  Normal Incontinent Weight:   Height:     BEHAVIORAL SYMPTOMS/MOOD NEUROLOGICAL BOWEL NUTRITION STATUS  Wanderer,Other (Comment) (Has agitation when he is re-directed or when he has to do something he does not want to do.)   Incontinent    AMBULATORY STATUS COMMUNICATION OF NEEDS Skin   Independent Verbally (Limited communication) Normal                       Personal Care Assistance Level of Assistance  Bathing,Feeding,Dressing Bathing Assistance: Limited assistance Feeding assistance: Independent Dressing Assistance: Limited assistance     Functional Limitations Info  Speech,Hearing,Sight Sight Info: Adequate Hearing Info: Adequate Speech Info: Adequate    SPECIAL CARE FACTORS FREQUENCY                       Contractures Contractures Info: Not present    Additional Factors Info  Code Status Code Status Info: DNR              Current Medications (02/07/2021):  This is the current hospital active medication list Current Facility-Administered Medications  Medication Dose Route Frequency Provider Last Rate Last Admin  . acetaminophen (TYLENOL) tablet 650 mg  650 mg Oral Q6H PRN Charlynne Pander, MD      . LORazepam (ATIVAN) tablet 1 mg  1 mg Oral Once Rancour, Stephen, MD      . QUEtiapine (SEROQUEL) tablet 25 mg  25 mg Oral BID Rankin, Shuvon B, NP   25 mg at 02/07/21 1019  . QUEtiapine (SEROQUEL) tablet 50 mg  50 mg Oral QHS Charlynne Pander, MD   50 mg at 02/05/21 2153  . sertraline (ZOLOFT) tablet 25 mg  25 mg Oral Daily Charlynne Pander, MD   25 mg at 02/07/21 1019   Current Outpatient Medications  Medication Sig Dispense Refill  . acetaminophen (TYLENOL) 325 MG tablet Take 2 tablets (650 mg total) by mouth every 6 (six) hours as needed for mild pain (or Fever >/= 101). (Patient taking differently: Take 650 mg by mouth every 8 (eight) hours as needed for mild pain (or Fever >/= 101).)    . acetaminophen (TYLENOL) 650 MG suppository Place 650 mg rectally every 8 (eight) hours as needed for fever or moderate pain.    Marland Kitchen ALPRAZolam (XANAX) 0.5 MG tablet Take 0.5 mg by mouth 2 (two) times daily as needed for anxiety (agitation).    . brimonidine (ALPHAGAN) 0.2 % ophthalmic solution  Place 1 drop into both eyes 3 (three) times daily.    . furosemide (LASIX) 20 MG tablet Take 20 mg by mouth.    . polyethylene glycol (MIRALAX / GLYCOLAX) 17 g packet Take 17 g by mouth daily as needed for mild constipation. 14 each 0  . QUEtiapine (SEROQUEL) 50 MG tablet Take 1 tablet (50 mg total) by mouth at bedtime. (Patient taking differently: Take 50 mg by mouth 2 (two) times daily as needed (agitation).)    . risperiDONE (RISPERDAL) 0.5 MG tablet Take 0.5 mg by mouth 2 (two) times daily.    . risperiDONE (RISPERDAL) 0.5 MG tablet Take 0.5 mg by mouth 2 (two) times daily as needed (agitation).    . sertraline (ZOLOFT) 100 MG  tablet Take 100 mg by mouth daily.    . timolol (TIMOPTIC) 0.5 % ophthalmic solution Place 1 drop into both eyes 2 (two) times daily.    . traZODone (DESYREL) 100 MG tablet Take 100 mg by mouth at bedtime.    Marland Kitchen QUEtiapine (SEROQUEL) 25 MG tablet Take 1 tablet (25 mg total) by mouth 2 (two) times daily as needed (agitation). (Patient not taking: Reported on 02/06/2021)    . sertraline (ZOLOFT) 25 MG tablet Take 1 tablet (25 mg total) by mouth daily. (Patient not taking: Reported on 02/06/2021)       Discharge Medications: Please see discharge summary for a list of discharge medications.  Relevant Imaging Results:  Relevant Lab Results:   Additional Information SS# [id number]  Susa Simmonds, LCSWA

## 2021-02-07 NOTE — ED Notes (Signed)
Lunch tray delivered.

## 2021-03-03 ENCOUNTER — Emergency Department (HOSPITAL_COMMUNITY): Payer: 59

## 2021-03-03 ENCOUNTER — Other Ambulatory Visit: Payer: Self-pay

## 2021-03-03 ENCOUNTER — Emergency Department (HOSPITAL_COMMUNITY)
Admission: EM | Admit: 2021-03-03 | Discharge: 2021-03-04 | Disposition: A | Discharge status: Home / Self Care | Payer: 59 | Attending: Emergency Medicine | Admitting: Emergency Medicine

## 2021-03-03 DIAGNOSIS — Z87891 Personal history of nicotine dependence: Secondary | ICD-10-CM | POA: Insufficient documentation

## 2021-03-03 DIAGNOSIS — I10 Essential (primary) hypertension: Secondary | ICD-10-CM | POA: Diagnosis not present

## 2021-03-03 DIAGNOSIS — F0281 Dementia in other diseases classified elsewhere with behavioral disturbance: Secondary | ICD-10-CM

## 2021-03-03 DIAGNOSIS — R4182 Altered mental status, unspecified: Secondary | ICD-10-CM

## 2021-03-03 DIAGNOSIS — Z79899 Other long term (current) drug therapy: Secondary | ICD-10-CM | POA: Diagnosis not present

## 2021-03-03 DIAGNOSIS — G3109 Other frontotemporal dementia: Secondary | ICD-10-CM | POA: Diagnosis not present

## 2021-03-03 DIAGNOSIS — R456 Violent behavior: Secondary | ICD-10-CM | POA: Diagnosis present

## 2021-03-03 DIAGNOSIS — R21 Rash and other nonspecific skin eruption: Secondary | ICD-10-CM | POA: Diagnosis not present

## 2021-03-03 LAB — COMPREHENSIVE METABOLIC PANEL
ALT: 49 U/L — ABNORMAL HIGH (ref 0–44)
AST: 41 U/L (ref 15–41)
Albumin: 3.7 g/dL (ref 3.5–5.0)
Alkaline Phosphatase: 60 U/L (ref 38–126)
Anion gap: 7 (ref 5–15)
BUN: 18 mg/dL (ref 8–23)
CO2: 29 mmol/L (ref 22–32)
Calcium: 8.6 mg/dL — ABNORMAL LOW (ref 8.9–10.3)
Chloride: 106 mmol/L (ref 98–111)
Creatinine, Ser: 0.84 mg/dL (ref 0.61–1.24)
GFR, Estimated: >60 mL/min (ref >60)
Glucose, Bld: 109 mg/dL — ABNORMAL HIGH (ref 70–99)
Potassium: 3.7 mmol/L (ref 3.5–5.1)
Sodium: 142 mmol/L (ref 135–145)
Total Bilirubin: 0.8 mg/dL (ref 0.3–1.2)
Total Protein: 6.3 g/dL — ABNORMAL LOW (ref 6.5–8.1)

## 2021-03-03 LAB — CBC WITH DIFFERENTIAL/PLATELET
Abs Immature Granulocytes: 0.02 10*3/uL (ref 0.00–0.07)
Basophils Absolute: 0 10*3/uL (ref 0.0–0.1)
Basophils Relative: 0 %
Eosinophils Absolute: 0 10*3/uL (ref 0.0–0.5)
Eosinophils Relative: 1 %
HCT: 40.3 % (ref 39.0–52.0)
Hemoglobin: 13.1 g/dL (ref 13.0–17.0)
Immature Granulocytes: 0 %
Lymphocytes Relative: 16 %
Lymphs Abs: 1.3 10*3/uL (ref 0.7–4.0)
MCH: 32 pg (ref 26.0–34.0)
MCHC: 32.5 g/dL (ref 30.0–36.0)
MCV: 98.5 fL (ref 80.0–100.0)
Monocytes Absolute: 0.8 10*3/uL (ref 0.1–1.0)
Monocytes Relative: 10 %
Neutro Abs: 6.4 10*3/uL (ref 1.7–7.7)
Neutrophils Relative %: 73 %
Platelets: 146 10*3/uL — ABNORMAL LOW (ref 150–400)
RBC: 4.09 MIL/uL — ABNORMAL LOW (ref 4.22–5.81)
RDW: 12.7 % (ref 11.5–15.5)
WBC: 8.6 10*3/uL (ref 4.0–10.5)
nRBC: 0 % (ref 0.0–0.2)

## 2021-03-03 MED ORDER — TRAZODONE HCL 100 MG PO TABS
100.0000 mg | ORAL_TABLET | Freq: Every day | ORAL | Status: DC
  Administered 2021-03-03: 100 mg via ORAL
  Filled 2021-03-03: qty 1

## 2021-03-03 MED ORDER — SERTRALINE HCL 50 MG PO TABS
25.0000 mg | ORAL_TABLET | Freq: Every day | ORAL | Status: DC
  Administered 2021-03-03 – 2021-03-04 (×2): 25 mg via ORAL
  Filled 2021-03-03 (×2): qty 1

## 2021-03-03 MED ORDER — ALPRAZOLAM 0.5 MG PO TABS
0.5000 mg | ORAL_TABLET | Freq: Once | ORAL | Status: AC
  Administered 2021-03-03: 0.5 mg via ORAL
  Filled 2021-03-03: qty 1

## 2021-03-03 MED ORDER — ZINC OXIDE 40 % EX OINT
TOPICAL_OINTMENT | Freq: Once | CUTANEOUS | Status: AC
  Filled 2021-03-03: qty 57

## 2021-03-03 MED ORDER — SERTRALINE HCL 50 MG PO TABS
100.0000 mg | ORAL_TABLET | Freq: Every day | ORAL | Status: DC
  Administered 2021-03-03 – 2021-03-04 (×2): 100 mg via ORAL
  Filled 2021-03-03 (×2): qty 2

## 2021-03-03 MED ORDER — FUROSEMIDE 20 MG PO TABS
20.0000 mg | ORAL_TABLET | Freq: Every day | ORAL | Status: DC
  Administered 2021-03-03 – 2021-03-04 (×2): 20 mg via ORAL
  Filled 2021-03-03 (×2): qty 1

## 2021-03-03 MED ORDER — QUETIAPINE FUMARATE 50 MG PO TABS
50.0000 mg | ORAL_TABLET | Freq: Every day | ORAL | Status: DC
  Administered 2021-03-03: 50 mg via ORAL
  Filled 2021-03-03: qty 1

## 2021-03-03 MED ORDER — MAGNESIUM HYDROXIDE 400 MG/5ML PO SUSP
30.0000 mL | Freq: Every evening | ORAL | Status: DC | PRN
  Filled 2021-03-03: qty 30

## 2021-03-03 MED ORDER — QUETIAPINE FUMARATE 25 MG PO TABS: 25.0000 mg | ORAL_TABLET | Freq: Two times a day (BID) | ORAL | Status: DC | PRN

## 2021-03-03 MED ORDER — CLONAZEPAM 0.5 MG PO TABS: 0.5000 mg | ORAL_TABLET | ORAL | Status: DC | PRN

## 2021-03-03 MED ORDER — TIMOLOL MALEATE 0.5 % OP SOLN
1.0000 [drp] | Freq: Two times a day (BID) | OPHTHALMIC | Status: DC
  Filled 2021-03-03: qty 5

## 2021-03-03 MED ORDER — RISPERIDONE 0.5 MG PO TABS
0.5000 mg | ORAL_TABLET | Freq: Two times a day (BID) | ORAL | Status: DC
  Administered 2021-03-03 – 2021-03-04 (×2): 0.5 mg via ORAL
  Filled 2021-03-03 (×2): qty 1

## 2021-03-03 MED ORDER — BRIMONIDINE TARTRATE 0.2 % OP SOLN
1.0000 [drp] | Freq: Two times a day (BID) | OPHTHALMIC | Status: DC
  Filled 2021-03-03: qty 5

## 2021-03-03 MED ORDER — GUAIFENESIN 100 MG/5ML PO SOLN: 200.0000 mg | Freq: Four times a day (QID) | ORAL | Status: DC | PRN

## 2021-03-03 NOTE — ED Notes (Signed)
Patient confuse and agitated. Naked in the room and Unable to follow commands. EDP informed. Non Violent restraints applied. Will continue to monitor.
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
## 2021-03-03 NOTE — ED Notes (Signed)
Patient noted to have stripped himself naked and gotten out of bed. Attempting to verbally redirect patient with difficulty. MD messaged about possible interventions to ensure patient and staff safety.
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
## 2021-03-04 LAB — URINALYSIS, ROUTINE W REFLEX MICROSCOPIC
Bilirubin Urine: NEGATIVE
Glucose, UA: NEGATIVE mg/dL
Hgb urine dipstick: NEGATIVE
Ketones, ur: 5 mg/dL — AB
Leukocytes,Ua: NEGATIVE
Nitrite: NEGATIVE
Protein, ur: NEGATIVE mg/dL
Specific Gravity, Urine: 1.011 (ref 1.005–1.030)
pH: 7 (ref 5.0–8.0)

## 2021-03-04 MED ORDER — LORAZEPAM 2 MG/ML IJ SOLN
2.0000 mg | Freq: Once | INTRAMUSCULAR | Status: AC
  Administered 2021-03-04: 2 mg via INTRAMUSCULAR
  Filled 2021-03-04: qty 1

## 2021-03-04 NOTE — TOC Progression Note (Signed)
Transition of Care [facility]) - Progression Note    Patient Details  Name: Micheal [name] MRN: [id number] Date of Birth: December 14, 1955  Transition of Care [facility] CM/SW Contact  Marina Goodell Phone Number:  [phone] 03/04/2021, 2:40 PM  Clinical Narrative:     CSW received call from [place] [phone] at [facility], requesting update on patient disposition.  CSW updated Crystal stating the patient has been psychiatrically evaluated and does not meet criteria for inpatient psychiatric and he is ready to return to the facility. Crystal stated it was her understanding the patient would receive medication changes and that's why she had him sent to the ED.  Crstal stated she did not feel the patient would be ready to return unless there were changes to his medications. CSW stated the patient was sent to the ED to be psychiatrically evaluated and it would be unlikely for medication changes to occur if the patient does meet inpatient criteria.  CSW recommended Crystal reach out to the patient's PCP for assistance with medication changes. Crystal stated she wanted to speak with the EDP.  CSW sent a secure chat to EDP w/ Crystal's contact information.        Expected Discharge Plan and Services                                                 Social Determinants of Health (SDOH) Interventions    Readmission Risk Interventions No flowsheet data found.

## 2021-03-04 NOTE — ED Notes (Addendum)
Pt is medical and psych cleared. Social work arranged for pt to return to [facility]. Called [facility] for transport. Pt is 3rd on [facility]'s list for transport. Called pt wife and let her know.

## 2021-03-04 NOTE — ED Notes (Addendum)
Pt spouse, Micheal [name], called [name] regarding his return. The person who answered said they don't have anyone there to take his information, so it will be Wednesday before he can return. Let her know social work will follow up with facility.

## 2021-03-04 NOTE — ED Provider Notes (Signed)
Micheal [name] has been evaluated by psychiatry.  No medication recommendations were made.  Behavior thought to be in keeping with his history of dementia.  ED work-up was negative for acute medical condition.  He will be discharged back to his facility.   [name], MD 03/04/21 [phone]

## 2021-03-04 NOTE — ED Notes (Signed)
Called [facility] 
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
## 2021-03-04 NOTE — TOC Initial Note (Addendum)
Transition of Care [facility]) - Initial/Assessment Note    Patient Details  Name: Micheal [name] MRN: [id number] Date of Birth: [birth year]  [facility] CM/SW Contact:    Marina Goodell Phone Number: [phone] 03/04/2021, 11:03 AM  Clinical Narrative:                  Patient presents to [facility] ED due to aggressive behaviors at [facility] facility [phone]. CSW was told Crystal from [employer] has called requesting update on discharge plans.  CSW confirmed w/ TTS patient psych/medically cleared and able to return to facility.  CSW contacted [facility], asked for [employer] and was informed by the front desk staff that no one was available from admissions dept and therefore the patient will not be able to return until tomorrow 03/05/2021, when the admissions staff returns.  CSW updated EDP/ED Staff.        Patient Goals and CMS Choice        Expected Discharge Plan and Services                                                Prior Living Arrangements/Services                       Activities of Daily Living      Permission Sought/Granted                  Emotional Assessment              Admission diagnosis:  Behavioral Issue Patient Active Problem List   Diagnosis Date Noted  . Frontotemporal dementia with behavioral disturbance (HCC) 03/01/2020  . Dehydration with hypernatremia 03/01/2020  . Essential hypertension 03/01/2020   PCP:  Kirby Funk, MD Pharmacy:  No Pharmacies Listed    Social Determinants of Health (SDOH) Interventions    Readmission Risk Interventions No flowsheet data found.

## 2021-03-04 NOTE — ED Notes (Signed)
Called pt spouse, Ms. Kerstein, and let her know restraints were removed at 6:10 pm.

## 2021-03-04 NOTE — ED Provider Notes (Signed)
Emergency Medicine Observation Re-evaluation Note  Micheal [name] is a 65 y.o. male, seen on rounds today.  Pt initially presented to the ED for complaints of Psychiatric Evaluation Currently, the patient is sleeping.  Physical Exam  BP (!) 164/80 (BP Location: Right Arm)   Pulse 80   Temp 98 F (36.7 C) (Axillary)   Resp 16   Ht 6' (1.829 m)   Wt 86.2 kg   SpO2 99%   BMI 25.77 kg/m  Physical Exam General: calm, sleeping Cardiac: normal rate Lungs: no increased WOB Psych: calm  ED Course / MDM  EKG:EKG Interpretation  Date/Time:  Saturday Mar 03 2021 17:56:19 EDT Ventricular Rate:  90 PR Interval:  162 QRS Duration: 106 QT Interval:  358 QTC Calculation: 438 R Axis:   102 Text Interpretation: Sinus rhythm Right axis deviation ,new since last tracing nonspecific t wave changes , similar to prior ECG Confirmed by Linwood Dibbles [phone]) on 03/03/2021 6:07:08 PM   I have reviewed the labs performed to date as well as medications administered while in observation.  Recent changes in the last 24 hours include patient has been cleared by psychiatry.  Plan  Current plan is for transfer back to NH.  TOC to coordinate. Patient is not under full IVC at this time.   Rolan Bucco, MD 03/04/21 [phone]
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
## 2021-03-04 NOTE — TOC Transition Note (Signed)
Transition of Care [facility]) - CM/SW Discharge Note   Patient Details  Name: Micheal [name] MRN: [id number] Date of Birth: 1956-03-26  Transition of Care [facility]) CM/SW Contact:  Marina Goodell Phone Number: [phone] 03/04/2021, 3:22 PM   Clinical Narrative:     Patient will d/c to [facility], Room# 321, Report# [phone] ask for Crystal. EDP/ED Staff updated. ED RN updated patient's spouse Zed, Wanninger (Spouse) [phone] [facility]). [facility] called for transport.        Patient Goals and CMS Choice        Discharge Placement                       Discharge Plan and Services                                     Social Determinants of Health (SDOH) Interventions     Readmission Risk Interventions No flowsheet data found.

## 2021-03-21 ENCOUNTER — Emergency Department (HOSPITAL_COMMUNITY)
Admission: EM | Admit: 2021-03-21 | Discharge: 2021-03-21 | Disposition: A | Discharge status: Home / Self Care | Payer: 59 | Attending: Emergency Medicine | Admitting: Emergency Medicine

## 2021-03-21 ENCOUNTER — Encounter (HOSPITAL_COMMUNITY): Payer: Self-pay | Admitting: Emergency Medicine

## 2021-03-21 ENCOUNTER — Emergency Department (HOSPITAL_COMMUNITY): Payer: 59

## 2021-03-21 DIAGNOSIS — F0391 Unspecified dementia with behavioral disturbance: Secondary | ICD-10-CM | POA: Diagnosis not present

## 2021-03-21 DIAGNOSIS — R82998 Other abnormal findings in urine: Secondary | ICD-10-CM | POA: Insufficient documentation

## 2021-03-21 DIAGNOSIS — I10 Essential (primary) hypertension: Secondary | ICD-10-CM | POA: Insufficient documentation

## 2021-03-21 DIAGNOSIS — N390 Urinary tract infection, site not specified: Secondary | ICD-10-CM

## 2021-03-21 DIAGNOSIS — Z79899 Other long term (current) drug therapy: Secondary | ICD-10-CM | POA: Insufficient documentation

## 2021-03-21 LAB — COMPREHENSIVE METABOLIC PANEL
ALT: 18 U/L (ref 0–44)
AST: 19 U/L (ref 15–41)
Albumin: 3.1 g/dL — ABNORMAL LOW (ref 3.5–5.0)
Alkaline Phosphatase: 59 U/L (ref 38–126)
Anion gap: 3 — ABNORMAL LOW (ref 5–15)
BUN: 20 mg/dL (ref 8–23)
CO2: 33 mmol/L — ABNORMAL HIGH (ref 22–32)
Calcium: 8.2 mg/dL — ABNORMAL LOW (ref 8.9–10.3)
Chloride: 112 mmol/L — ABNORMAL HIGH (ref 98–111)
Creatinine, Ser: 0.9 mg/dL (ref 0.61–1.24)
GFR, Estimated: >60 mL/min (ref >60)
Glucose, Bld: 108 mg/dL — ABNORMAL HIGH (ref 70–99)
Potassium: 3.6 mmol/L (ref 3.5–5.1)
Sodium: 148 mmol/L — ABNORMAL HIGH (ref 135–145)
Total Bilirubin: 0.3 mg/dL (ref 0.3–1.2)
Total Protein: 5.7 g/dL — ABNORMAL LOW (ref 6.5–8.1)

## 2021-03-21 LAB — LACTIC ACID, PLASMA: Lactic Acid, Venous: 1.5 mmol/L (ref 0.5–1.9)

## 2021-03-21 LAB — CBC WITH DIFFERENTIAL/PLATELET
Abs Immature Granulocytes: 0.03 10*3/uL (ref 0.00–0.07)
Basophils Absolute: 0 10*3/uL (ref 0.0–0.1)
Basophils Relative: 0 %
Eosinophils Absolute: 0.3 10*3/uL (ref 0.0–0.5)
Eosinophils Relative: 3 %
HCT: 40.3 % (ref 39.0–52.0)
Hemoglobin: 12.7 g/dL — ABNORMAL LOW (ref 13.0–17.0)
Immature Granulocytes: 0 %
Lymphocytes Relative: 20 %
Lymphs Abs: 1.5 10*3/uL (ref 0.7–4.0)
MCH: 32 pg (ref 26.0–34.0)
MCHC: 31.5 g/dL (ref 30.0–36.0)
MCV: 101.5 fL — ABNORMAL HIGH (ref 80.0–100.0)
Monocytes Absolute: 0.6 10*3/uL (ref 0.1–1.0)
Monocytes Relative: 8 %
Neutro Abs: 5.2 10*3/uL (ref 1.7–7.7)
Neutrophils Relative %: 69 %
Platelets: 191 10*3/uL (ref 150–400)
RBC: 3.97 MIL/uL — ABNORMAL LOW (ref 4.22–5.81)
RDW: 12.9 % (ref 11.5–15.5)
WBC: 7.7 10*3/uL (ref 4.0–10.5)
nRBC: 0 % (ref 0.0–0.2)

## 2021-03-21 LAB — URINALYSIS, ROUTINE W REFLEX MICROSCOPIC
Glucose, UA: NEGATIVE mg/dL
Ketones, ur: NEGATIVE mg/dL
Nitrite: POSITIVE — AB
Protein, ur: >=300 mg/dL — AB
Specific Gravity, Urine: 1.025 (ref 1.005–1.030)
WBC, UA: >50 WBC/hpf — ABNORMAL HIGH (ref 0–5)
pH: 7 (ref 5.0–8.0)

## 2021-03-21 MED ORDER — SODIUM CHLORIDE 0.9 % IV SOLN
1.0000 g | Freq: Once | INTRAVENOUS | Status: AC
  Administered 2021-03-21: 1 g via INTRAVENOUS
  Filled 2021-03-21: qty 10

## 2021-03-21 MED ORDER — SODIUM CHLORIDE 0.9 % IV BOLUS
500.0000 mL | Freq: Once | INTRAVENOUS | Status: AC
  Administered 2021-03-21: 500 mL via INTRAVENOUS

## 2021-03-21 MED ORDER — CEPHALEXIN 250 MG PO CAPS: 250.0000 mg | ORAL_CAPSULE | Freq: Four times a day (QID) | ORAL | 0 refills | Status: AC

## 2021-03-21 NOTE — Discharge Instructions (Addendum)
Your work-up today was consistent with a UTI.  Take the antibiotics as prescribed. There were no clear signs of infection in the groin, however if there is redness or irritation, you can apply a barrier cream to the area. Follow-up with your primary care doctor as needed for further evaluation. Return to the emergency room with any new, worsening, concerning symptoms.

## 2021-03-21 NOTE — ED Notes (Signed)
Pt uncooperative and unwilling to get out of bed to attempt to ambulate. Per facility, pt is ambulatory at baseline. MD Rhunette Croft made aware.

## 2021-03-21 NOTE — ED Notes (Signed)
[facility] called to transport pt back to [facility]

## 2021-03-21 NOTE — ED Provider Notes (Signed)
[facility]-EMERGENCY DEPT Provider Note   CSN: [id number] Arrival date & time: 03/21/21  1507     History No chief complaint on file.   Micheal [name] is a 65 y.o. male presenting for evaluation of possible uti.   Level V caveat due to dementia.   Per staff at [facility], pt was sent to the ED for concerns for a UTI. Pt has strong urine odor and color. Additionally, staff had concern about redness in the R groin that appears tender. Per staff, pt had abnormal gait/lean towards the R just prior to EMS arrival. Normally he walks without assistance or lean. No facial asymmetry per staff, no upper ext weakness.   Per staff, pt is nonverbal at baseline. H/o htn and dementia.   HPI     Past Medical History:  Diagnosis Date   Hypertension     Patient Active Problem List   Diagnosis Date Noted   Frontotemporal dementia with behavioral disturbance (HCC) 03/01/2020   Dehydration with hypernatremia 03/01/2020   Essential hypertension 03/01/2020    History reviewed. No pertinent surgical history.     Family History  Problem Relation Age of Onset   Dementia Father    Parkinsonism Father    Dementia Paternal Aunt    Dementia Paternal Uncle    Dementia Paternal Grandfather     Social History   Tobacco Use   Smoking status: Never   Smokeless tobacco: Never  Vaping Use   Vaping Use: Never used  Substance Use Topics   Alcohol use: Never   Drug use: Never    Home Medications Prior to Admission medications   Medication Sig Start Date End Date Taking? Authorizing Provider  acetaminophen (TYLENOL) 325 MG tablet Take 2 tablets (650 mg total) by mouth every 6 (six) hours as needed for mild pain (or Fever >/= 101). Patient taking differently: Take 650 mg by mouth every 6 (six) hours as needed for fever or headache (pain). 03/07/20   Zannie Cove, MD  acetaminophen (TYLENOL) 500 MG tablet Take 500 mg by mouth every 6 (six) hours as needed (fever  99.5-101 F, minor headache, minor discomfort).    [provider]  alum & mag hydroxide-simeth (MAALOX/MYLANTA) 200-200-20 MG/5ML suspension Take 30 mLs by mouth every 6 (six) hours as needed for indigestion or heartburn.    [provider]  brimonidine (ALPHAGAN) 0.2 % ophthalmic solution Place 1 drop into both eyes 2 (two) times daily.    [provider]  clonazePAM (KLONOPIN) 0.5 MG tablet Take 0.5 mg by mouth every 4 (four) hours as needed (severe anxiety).    [provider]  furosemide (LASIX) 20 MG tablet Take 20 mg by mouth daily.    [provider]  guaifenesin (ROBITUSSIN) 100 MG/5ML syrup Take 200 mg by mouth every 6 (six) hours as needed for cough.    [provider]  haloperidol decanoate (HALDOL DECANOATE) 50 MG/ML injection Inject 25 mg into the muscle every 28 (twenty-eight) days.    [provider]  loperamide (IMODIUM) 2 MG capsule Take 2 mg by mouth as needed for diarrhea or loose stools (max 8 doses/ 24 hours).    [provider]  magnesium hydroxide (MILK OF MAGNESIA) 400 MG/5ML suspension Take 30 mLs by mouth at bedtime as needed (constipation).    [provider]  Neomycin-Bacitracin-Polymyxin (TRIPLE ANTIBIOTIC) [phone] OINT Apply 1 application topically daily as needed (minor skin tears/abrasians).    [provider]  polyethylene glycol (MIRALAX /  GLYCOLAX) 17 g packet Take 17 g by mouth daily as needed for mild constipation. Patient taking differently: Take 17 g by mouth daily as needed (constipation). Mix in 8 oz liquid and drink 03/07/20   Zannie Cove, MD  QUEtiapine (SEROQUEL) 25 MG tablet Take 1 tablet (25 mg total) by mouth 2 (two) times daily as needed (agitation). Patient taking differently: Take 25 mg by mouth every 12 (twelve) hours as needed (agitation). 03/07/20   Zannie Cove, MD  QUEtiapine (SEROQUEL) 50 MG tablet Take 1 tablet (50 mg total) by mouth at bedtime.  03/07/20   Zannie Cove, MD  risperiDONE (RISPERDAL) 0.5 MG tablet Take 0.5 mg by mouth 2 (two) times daily.    [provider]  sertraline (ZOLOFT) 100 MG tablet Take 100 mg by mouth daily.    [provider]  sertraline (ZOLOFT) 25 MG tablet Take 1 tablet (25 mg total) by mouth daily. 03/08/20   Zannie Cove, MD  timolol (TIMOPTIC) 0.5 % ophthalmic solution Place 1 drop into both eyes 2 (two) times daily.    [provider]  traZODone (DESYREL) 100 MG tablet Take 100 mg by mouth at bedtime.    [provider]    Allergies    Patient has no known allergies.  Review of Systems   Review of Systems  Unable to perform ROS: Dementia   Physical Exam Updated Vital Signs SpO2 94%   Physical Exam Vitals and nursing note reviewed.  Constitutional:      General: He is not in acute distress.    Appearance: Normal appearance.     Comments: Nontoxic. Pt will open eyes  HENT:     Head: Normocephalic and atraumatic.  Eyes:     Conjunctiva/sclera: Conjunctivae normal.     Pupils: Pupils are equal, round, and reactive to light.  Cardiovascular:     Rate and Rhythm: Normal rate and regular rhythm.     Pulses: Normal pulses.  Pulmonary:     Effort: Pulmonary effort is normal. No respiratory distress.     Breath sounds: Normal breath sounds. No wheezing.     Comments: Speaking in full sentences.  Clear lung sounds in all fields. Abdominal:     General: There is no distension.     Palpations: Abdomen is soft. There is no mass.     Tenderness: There is no abdominal tenderness. There is no guarding or rebound.  Genitourinary:    Comments: No clear groin infection, erythema, or tenderness.  Musculoskeletal:        General: Normal range of motion.     Cervical back: Normal range of motion and neck supple.  Skin:    General: Skin is warm and dry.     Capillary Refill: Capillary refill takes less than 2 seconds.  Neurological:     Mental Status: He is  alert.     Comments: Unable to perform full neuro exam due to pt cooperation.  Equal muscle tone bilaterally of upper and lower ext. No facial asymmetry. Pt will not walk  Psychiatric:        Mood and Affect: Mood and affect normal.        Speech: Speech normal.        Behavior: Behavior normal.    ED Results / Procedures / Treatments   Labs (all labs ordered are listed, but only abnormal results are displayed) Labs Reviewed - No data to display  EKG None  Radiology No results found.  Procedures Procedures  Medications Ordered in ED Medications - No data to display  ED Course  I have reviewed the triage vital signs and the nursing notes.  Pertinent labs & imaging results that were available during my care of the patient were reviewed by me and considered in my medical decision making (see chart for details).    MDM Rules/Calculators/A&P                          Patient presenting due to facility concerned that he has a UTI.  On exam, patient will open his eyes, but otherwise does not engage.  He is nonverbal at baseline.  Is not cooperative with neuro exam, however no focal deficits noted on my evaluation.  GU evaluation does not reveal any obvious infection, however there is concern by facility, pt can have barrier cream applied to the area. Will order labs, ua, and CT head.   Labs consistent with dehydration as patient is mildly hypernatremic and with elevated chloride. No leukocytosis. Vitals stable. UA with signs of infection. Will tx with rocephin in the ED and Po abx at home. No signs of sepsis, and pt can be appropriately treated at facility if he is able to walk in the ED.  Pt signed out to Theodoro Grist, MD, for f/u on head ct and ambulation.   Final Clinical Impression(s) / ED Diagnoses Final diagnoses:  None    Rx / DC Orders ED Discharge Orders     None        Alveria Apley, PA-C 03/21/21 1746    Derwood Kaplan, MD 03/21/21 1814     Derwood Kaplan, MD 03/21/21 8338

## 2021-03-21 NOTE — ED Triage Notes (Signed)
Pt BIB EMS from [facility] c/o possible UTI. Staff reports pts urine is discolored. Pt is non-verbal and hx of dementia. Staff also reports pt has a abnormal gait that began 30 min ago. Mod stroke screen was negative.

## 2021-03-24 LAB — URINE CULTURE: Culture: >=100000 — AB

## 2021-05-22 ENCOUNTER — Emergency Department (HOSPITAL_COMMUNITY)
Admission: EM | Admit: 2021-05-22 | Discharge: 2021-05-23 | Disposition: A | Discharge status: Home / Self Care | Payer: Medicare Other | Attending: Emergency Medicine | Admitting: Emergency Medicine

## 2021-05-22 ENCOUNTER — Encounter (HOSPITAL_COMMUNITY): Payer: Self-pay

## 2021-05-22 ENCOUNTER — Emergency Department (HOSPITAL_COMMUNITY): Payer: Medicare Other

## 2021-05-22 ENCOUNTER — Other Ambulatory Visit: Payer: Self-pay

## 2021-05-22 DIAGNOSIS — R404 Transient alteration of awareness: Secondary | ICD-10-CM

## 2021-05-22 DIAGNOSIS — F0390 Unspecified dementia without behavioral disturbance: Secondary | ICD-10-CM | POA: Insufficient documentation

## 2021-05-22 DIAGNOSIS — R791 Abnormal coagulation profile: Secondary | ICD-10-CM | POA: Insufficient documentation

## 2021-05-22 DIAGNOSIS — R509 Fever, unspecified: Secondary | ICD-10-CM | POA: Diagnosis not present

## 2021-05-22 DIAGNOSIS — R4182 Altered mental status, unspecified: Secondary | ICD-10-CM | POA: Insufficient documentation

## 2021-05-22 DIAGNOSIS — Z20822 Contact with and (suspected) exposure to covid-19: Secondary | ICD-10-CM | POA: Insufficient documentation

## 2021-05-22 DIAGNOSIS — I10 Essential (primary) hypertension: Secondary | ICD-10-CM | POA: Diagnosis not present

## 2021-05-22 LAB — URINALYSIS, ROUTINE W REFLEX MICROSCOPIC
Bacteria, UA: NONE SEEN
Bilirubin Urine: NEGATIVE
Glucose, UA: NEGATIVE mg/dL
Hgb urine dipstick: NEGATIVE
Ketones, ur: 5 mg/dL — AB
Leukocytes,Ua: NEGATIVE
Nitrite: NEGATIVE
Protein, ur: 30 mg/dL — AB
Specific Gravity, Urine: 1.034 — ABNORMAL HIGH (ref 1.005–1.030)
pH: 5 (ref 5.0–8.0)

## 2021-05-22 LAB — COMPREHENSIVE METABOLIC PANEL
ALT: 21 U/L (ref 0–44)
AST: 54 U/L — ABNORMAL HIGH (ref 15–41)
Albumin: 3.7 g/dL (ref 3.5–5.0)
Alkaline Phosphatase: 59 U/L (ref 38–126)
Anion gap: 8 (ref 5–15)
BUN: 19 mg/dL (ref 8–23)
CO2: 30 mmol/L (ref 22–32)
Calcium: 8.3 mg/dL — ABNORMAL LOW (ref 8.9–10.3)
Chloride: 108 mmol/L (ref 98–111)
Creatinine, Ser: 0.95 mg/dL (ref 0.61–1.24)
GFR, Estimated: >60 mL/min (ref >60)
Glucose, Bld: 101 mg/dL — ABNORMAL HIGH (ref 70–99)
Potassium: 3.7 mmol/L (ref 3.5–5.1)
Sodium: 146 mmol/L — ABNORMAL HIGH (ref 135–145)
Total Bilirubin: 0.7 mg/dL (ref 0.3–1.2)
Total Protein: 5.8 g/dL — ABNORMAL LOW (ref 6.5–8.1)

## 2021-05-22 LAB — CBC WITH DIFFERENTIAL/PLATELET
Abs Immature Granulocytes: 0.02 10*3/uL (ref 0.00–0.07)
Basophils Absolute: 0 10*3/uL (ref 0.0–0.1)
Basophils Relative: 0 %
Eosinophils Absolute: 0.1 10*3/uL (ref 0.0–0.5)
Eosinophils Relative: 2 %
HCT: 39.9 % (ref 39.0–52.0)
Hemoglobin: 12.6 g/dL — ABNORMAL LOW (ref 13.0–17.0)
Immature Granulocytes: 0 %
Lymphocytes Relative: 21 %
Lymphs Abs: 1.6 10*3/uL (ref 0.7–4.0)
MCH: 31.2 pg (ref 26.0–34.0)
MCHC: 31.6 g/dL (ref 30.0–36.0)
MCV: 98.8 fL (ref 80.0–100.0)
Monocytes Absolute: 0.6 10*3/uL (ref 0.1–1.0)
Monocytes Relative: 8 %
Neutro Abs: 5.1 10*3/uL (ref 1.7–7.7)
Neutrophils Relative %: 69 %
Platelets: 135 10*3/uL — ABNORMAL LOW (ref 150–400)
RBC: 4.04 MIL/uL — ABNORMAL LOW (ref 4.22–5.81)
RDW: 13.5 % (ref 11.5–15.5)
WBC: 7.5 10*3/uL (ref 4.0–10.5)
nRBC: 0 % (ref 0.0–0.2)

## 2021-05-22 LAB — PROTIME-INR
INR: 1 (ref 0.8–1.2)
Prothrombin Time: 13.5 seconds (ref 11.4–15.2)

## 2021-05-22 LAB — RESP PANEL BY RT-PCR (FLU A&B, COVID) ARPGX2
Influenza A by PCR: NEGATIVE
Influenza B by PCR: NEGATIVE
SARS Coronavirus 2 by RT PCR: NEGATIVE

## 2021-05-22 LAB — APTT: aPTT: 32 seconds (ref 24–36)

## 2021-05-22 LAB — LACTIC ACID, PLASMA
Lactic Acid, Venous: 2 mmol/L (ref 0.5–1.9)
Lactic Acid, Venous: 1.2 mmol/L (ref 0.5–1.9)

## 2021-05-22 MED ORDER — SODIUM CHLORIDE 0.9 % IV BOLUS (SEPSIS)
1000.0000 mL | Freq: Once | INTRAVENOUS | Status: AC
  Administered 2021-05-22: 1000 mL via INTRAVENOUS

## 2021-05-22 MED ORDER — CEFTRIAXONE SODIUM 2 G IJ SOLR
2.0000 g | INTRAMUSCULAR | Status: DC
  Administered 2021-05-22: 2 g via INTRAVENOUS
  Filled 2021-05-22: qty 20

## 2021-05-22 MED ORDER — LACTATED RINGERS IV SOLN: INTRAVENOUS | Status: DC

## 2021-05-22 NOTE — ED Triage Notes (Signed)
Patient brought in via ems from skilled facility. EMS was called out for fever PTA. Denies other symptoms. EMS gave 1 g tylenol PO. VSS

## 2021-05-22 NOTE — ED Notes (Signed)
[facility] with lactate and 1st set of blood cultures sent to lab.

## 2021-05-22 NOTE — Progress Notes (Signed)
Blood cultures drawn before antibiotics hung per ED RN

## 2021-05-22 NOTE — ED Provider Notes (Signed)
[facility]-EMERGENCY DEPT Provider Note   CSN: [id number] Arrival date & time: 05/22/21  1458     History No chief complaint on file.   Micheal [name] is a 65 y.o. male.  HPI     65 year old male comes in a chief complaint of fevers. Patient has history of advanced dementia.  Level 5 caveat for severe dementia.  I spoke with patient's wife.  She reports that she saw the patient yesterday.  At baseline he is not able to carry on conversations.  Yesterday when she saw him, he was pacing around.  That is something that he has done in the past.  Today she received a call that patient has fevers.   Per SNF - fevers started today. No covid at facility. He was at baseline mentation -but just weaker. Reduced intake as well.  Past Medical History:  Diagnosis Date   Hypertension     Patient Active Problem List   Diagnosis Date Noted   Frontotemporal dementia with behavioral disturbance (HCC) 03/01/2020   Dehydration with hypernatremia 03/01/2020   Essential hypertension 03/01/2020    History reviewed. No pertinent surgical history.     Family History  Problem Relation Age of Onset   Dementia Father    Parkinsonism Father    Dementia Paternal Aunt    Dementia Paternal Uncle    Dementia Paternal Grandfather     Social History   Tobacco Use   Smoking status: Never   Smokeless tobacco: Never  Vaping Use   Vaping Use: Never used  Substance Use Topics   Alcohol use: Never   Drug use: Never    Home Medications Prior to Admission medications   Medication Sig Start Date End Date Taking? Authorizing Provider  acetaminophen (TYLENOL) 325 MG tablet Take 2 tablets (650 mg total) by mouth every 6 (six) hours as needed for mild pain (or Fever >/= 101). Patient taking differently: Take 650 mg by mouth every 6 (six) hours as needed for fever or headache (pain). 03/07/20   Zannie Cove, MD  acetaminophen (TYLENOL) 500 MG tablet Take 500 mg by mouth every  6 (six) hours as needed (fever 99.5-101 F, minor headache, minor discomfort).    [provider]  alum & mag hydroxide-simeth (MAALOX/MYLANTA) 200-200-20 MG/5ML suspension Take 30 mLs by mouth every 6 (six) hours as needed for indigestion or heartburn.    [provider]  brimonidine (ALPHAGAN) 0.2 % ophthalmic solution Place 1 drop into both eyes 2 (two) times daily.    [provider]  clonazePAM (KLONOPIN) 0.5 MG tablet Take 0.5 mg by mouth every 4 (four) hours as needed (severe anxiety).    [provider]  furosemide (LASIX) 20 MG tablet Take 20 mg by mouth daily.    [provider]  guaifenesin (ROBITUSSIN) 100 MG/5ML syrup Take 200 mg by mouth every 6 (six) hours as needed for cough.    [provider]  haloperidol decanoate (HALDOL DECANOATE) 50 MG/ML injection Inject 25 mg into the muscle every 28 (twenty-eight) days.    [provider]  loperamide (IMODIUM) 2 MG capsule Take 2 mg by mouth as needed for diarrhea or loose stools (max 8 doses/ 24 hours).    [provider]  magnesium hydroxide (MILK OF MAGNESIA) 400 MG/5ML suspension Take 30 mLs by mouth at bedtime as needed (constipation).    [provider]  Neomycin-Bacitracin-Polymyxin (TRIPLE ANTIBIOTIC) [phone] OINT Apply 1 application topically daily as needed (minor skin tears/abrasians).  [provider]  polyethylene glycol (MIRALAX / GLYCOLAX) 17 g packet Take 17 g by mouth daily as needed for mild constipation. Patient taking differently: Take 17 g by mouth daily as needed (constipation). Mix in 8 oz liquid and drink 03/07/20   Zannie Cove, MD  QUEtiapine (SEROQUEL) 25 MG tablet Take 1 tablet (25 mg total) by mouth 2 (two) times daily as needed (agitation). Patient taking differently: Take 25 mg by mouth every 12 (twelve) hours as needed (agitation). 03/07/20   Zannie Cove, MD  QUEtiapine (SEROQUEL) 50 MG tablet Take 1 tablet (50 mg  total) by mouth at bedtime. 03/07/20   Zannie Cove, MD  risperiDONE (RISPERDAL) 0.5 MG tablet Take 0.5 mg by mouth 2 (two) times daily.    [provider]  sertraline (ZOLOFT) 100 MG tablet Take 100 mg by mouth daily.    [provider]  sertraline (ZOLOFT) 25 MG tablet Take 1 tablet (25 mg total) by mouth daily. 03/08/20   Zannie Cove, MD  timolol (TIMOPTIC) 0.5 % ophthalmic solution Place 1 drop into both eyes 2 (two) times daily.    [provider]  traZODone (DESYREL) 100 MG tablet Take 100 mg by mouth at bedtime.    [provider]    Allergies    Patient has no known allergies.  Review of Systems   Review of Systems  Unable to perform ROS: Dementia   Physical Exam Updated Vital Signs BP 122/75   Pulse 74   Temp 99.3 F (37.4 C) (Oral)   Resp 16   Ht 6' (1.829 m)   Wt 86.2 kg   SpO2 97%   BMI 25.77 kg/m   Physical Exam Vitals and nursing note reviewed.  Constitutional:      Appearance: He is well-developed.     Comments: Responds for painful stimuli  Eyes:     Comments: 4 mm and equal  Cardiovascular:     Rate and Rhythm: Normal rate.  Pulmonary:     Effort: Pulmonary effort is normal.  Abdominal:     Tenderness: There is no abdominal tenderness.  Musculoskeletal:     Cervical back: Neck supple.  Skin:    General: Skin is warm.  Neurological:     Comments: Responding to painful stimuli only    ED Results / Procedures / Treatments   Labs (all labs ordered are listed, but only abnormal results are displayed) Labs Reviewed  RESP PANEL BY RT-PCR (FLU A&B, COVID) ARPGX2  CULTURE, BLOOD (ROUTINE X 2)  CULTURE, BLOOD (ROUTINE X 2)  URINE CULTURE  LACTIC ACID, PLASMA  LACTIC ACID, PLASMA  COMPREHENSIVE METABOLIC PANEL  CBC WITH DIFFERENTIAL/PLATELET  PROTIME-INR  APTT  URINALYSIS, ROUTINE W REFLEX MICROSCOPIC    EKG EKG Interpretation  Date/Time:  Tuesday May 22 2021 16:24:24 EDT Ventricular Rate:  73 PR  Interval:  155 QRS Duration: 100 QT Interval:  399 QTC Calculation: 440 R Axis:   77 Text Interpretation: Sinus rhythm No acute changes No significant change since last tracing Confirmed by Derwood Kaplan [phone]) on 05/22/2021 4:42:00 PM  Radiology No results found.  Procedures Procedures   Medications Ordered in ED Medications  lactated ringers infusion (has no administration in time range)  sodium chloride 0.9 % bolus 1,000 mL (1,000 mLs Intravenous New Bag/Given 05/22/21 1637)  cefTRIAXone (ROCEPHIN) 2 g in sodium chloride 0.9 % 100 mL IVPB (2 g Intravenous New Bag/Given 05/22/21 1636)    ED Course  I have reviewed the triage vital  signs and the nursing notes.  Pertinent labs & imaging results that were available during my care of the patient were reviewed by me and considered in my medical decision making (see chart for details).    MDM Rules/Calculators/A&P                            65 year old male comes in with chief complaint of altered mental status and fever.  DDx includes: ICH / Stroke ACS Sepsis syndrome Infection - UTI/Pneumonia Encephalopathy  Electrolyte abnormality Drug overdose DKA Metabolic disorders including thyroid disorders, adrenal insufficiency Cancer of unknown origin / paraneoplastic process  Patient is responding to noxious stimuli only. It appears that at baseline, he has had waxing and waning alertness.  He also per nursing home was at baseline, just more weaker than usual with reduced p.o. intake.  Appropriate labs have been sent.  Will need reassessment.  I will give him antibiotics given similar presentation with his UTI last time he was in the ER.  Final Clinical Impression(s) / ED Diagnoses Final diagnoses:  None    Rx / DC Orders ED Discharge Orders     None        Derwood Kaplan, MD 05/22/21 1651

## 2021-05-22 NOTE — ED Notes (Signed)
[facility] contacted, transport arranged for patient.  

## 2021-05-22 NOTE — Progress Notes (Signed)
Elink following for code sepsis 

## 2021-05-22 NOTE — ED Notes (Signed)
Critical value of lactic acid of 2.0 reported to RN and to Donnald Garre, MD

## 2021-05-22 NOTE — ED Provider Notes (Signed)
Fever, advanced dementia. Decreased responsive from baseline. At baseline walks independently without walker. H/o UTI with similar presentaton. Physical Exam  BP 122/75   Pulse 74   Temp 99.3 F (37.4 C) (Oral)   Resp 16   Ht 6' (1.829 m)   Wt 86.2 kg   SpO2 97%   BMI 25.77 kg/m   Physical Exam  ED Course/Procedures     Procedures  MDM   Patient's urinalysis returns negative.  He has had fluids and a dose of Rocephin empirically for possible UTI.  Patient symptoms have resolved.  He is now sitting up in the stretcher cheerful and alert.  He is watching television.  He is conversant in a pleasant manner but clearly signs of dementia without having situational insight.  This is consistent with reported baseline.  At this time will return to nursing care.  Patient is covered for 24 hours with Rocephin with a urine culture and blood culture pending.  COVID testing is negative at this time.      Arby Barrette, MD 05/22/21 2039

## 2021-05-22 NOTE — Discharge Instructions (Addendum)
1.  Patient was seen for altered mental status and fever at the nursing home. 2.  After period of observation the patient is alert and talkative.  He is pleasant mood with stable vital signs.  Agnostic work-up does not show source of infection at this time.  Patient was given a dose of Rocephin to cover for urinary tract infection.  Cultures on urine and blood should begin to show results in 24 to 48 hours.  If patient needs continued antibiotics, you will be called. 3.  COVID testing is negative but you should probably test again if there are multiple cases in the nursing facility and try to isolate until repeat testing is done.

## 2021-05-23 DIAGNOSIS — R4182 Altered mental status, unspecified: Secondary | ICD-10-CM | POA: Diagnosis not present

## 2021-05-24 LAB — URINE CULTURE

## 2021-05-27 LAB — CULTURE, BLOOD (ROUTINE X 2)
Culture: NO GROWTH
Culture: NO GROWTH
Special Requests: ADEQUATE
Special Requests: ADEQUATE

## 2021-06-29 ENCOUNTER — Emergency Department (HOSPITAL_COMMUNITY): Payer: Medicare Other

## 2021-06-29 ENCOUNTER — Encounter (HOSPITAL_COMMUNITY): Payer: Self-pay

## 2021-06-29 ENCOUNTER — Other Ambulatory Visit: Payer: Self-pay

## 2021-06-29 ENCOUNTER — Emergency Department (HOSPITAL_COMMUNITY)
Admission: EM | Admit: 2021-06-29 | Discharge: 2021-06-29 | Disposition: A | Discharge status: Home / Self Care | Payer: Medicare Other | Attending: Emergency Medicine | Admitting: Emergency Medicine

## 2021-06-29 DIAGNOSIS — W19XXXA Unspecified fall, initial encounter: Secondary | ICD-10-CM | POA: Insufficient documentation

## 2021-06-29 DIAGNOSIS — Z23 Encounter for immunization: Secondary | ICD-10-CM | POA: Diagnosis not present

## 2021-06-29 DIAGNOSIS — F0390 Unspecified dementia without behavioral disturbance: Secondary | ICD-10-CM | POA: Diagnosis not present

## 2021-06-29 DIAGNOSIS — Y92129 Unspecified place in nursing home as the place of occurrence of the external cause: Secondary | ICD-10-CM | POA: Insufficient documentation

## 2021-06-29 DIAGNOSIS — S0101XA Laceration without foreign body of scalp, initial encounter: Secondary | ICD-10-CM | POA: Diagnosis not present

## 2021-06-29 DIAGNOSIS — Z79899 Other long term (current) drug therapy: Secondary | ICD-10-CM | POA: Diagnosis not present

## 2021-06-29 DIAGNOSIS — Y9301 Activity, walking, marching and hiking: Secondary | ICD-10-CM | POA: Diagnosis not present

## 2021-06-29 DIAGNOSIS — S0990XA Unspecified injury of head, initial encounter: Secondary | ICD-10-CM | POA: Diagnosis present

## 2021-06-29 DIAGNOSIS — I10 Essential (primary) hypertension: Secondary | ICD-10-CM | POA: Diagnosis not present

## 2021-06-29 LAB — BASIC METABOLIC PANEL
Anion gap: 5 (ref 5–15)
BUN: 19 mg/dL (ref 8–23)
CO2: 31 mmol/L (ref 22–32)
Calcium: 8.8 mg/dL — ABNORMAL LOW (ref 8.9–10.3)
Chloride: 109 mmol/L (ref 98–111)
Creatinine, Ser: 0.95 mg/dL (ref 0.61–1.24)
GFR, Estimated: >60 mL/min (ref >60)
Glucose, Bld: 101 mg/dL — ABNORMAL HIGH (ref 70–99)
Potassium: 4.1 mmol/L (ref 3.5–5.1)
Sodium: 145 mmol/L (ref 135–145)

## 2021-06-29 LAB — CBC
HCT: 39.8 % (ref 39.0–52.0)
Hemoglobin: 12.9 g/dL — ABNORMAL LOW (ref 13.0–17.0)
MCH: 32.5 pg (ref 26.0–34.0)
MCHC: 32.4 g/dL (ref 30.0–36.0)
MCV: 100.3 fL — ABNORMAL HIGH (ref 80.0–100.0)
Platelets: 123 10*3/uL — ABNORMAL LOW (ref 150–400)
RBC: 3.97 MIL/uL — ABNORMAL LOW (ref 4.22–5.81)
RDW: 13.2 % (ref 11.5–15.5)
WBC: 6.6 10*3/uL (ref 4.0–10.5)
nRBC: 0 % (ref 0.0–0.2)

## 2021-06-29 MED ORDER — TETANUS-DIPHTH-ACELL PERTUSSIS 5-2.5-18.5 LF-MCG/0.5 IM SUSY
0.5000 mL | PREFILLED_SYRINGE | Freq: Once | INTRAMUSCULAR | Status: AC
  Administered 2021-06-29: 0.5 mL via INTRAMUSCULAR
  Filled 2021-06-29: qty 0.5

## 2021-06-29 MED ORDER — LIDOCAINE-EPINEPHRINE 2 %-1:100000 IJ SOLN
20.0000 mL | Freq: Once | INTRAMUSCULAR | Status: AC
  Administered 2021-06-29: 20 mL via INTRADERMAL
  Filled 2021-06-29: qty 1

## 2021-06-29 NOTE — ED Notes (Signed)
Md assisted by RN for staples

## 2021-06-29 NOTE — ED Provider Notes (Signed)
[facility]-EMERGENCY DEPT Provider Note   CSN: [id number] Arrival date & time: 06/29/21  0703  Level 5 caveat: Altered mental status  History Chief Complaint  Patient presents with   Micheal [name] is a 65 y.o. male.   Fall   Patient presents to the ED for evaluation after a fall.  Patient has a history of dementia and resides at a nursing facility.  He was found walking around with a trash bag on his head in that is when they noticed he had a laceration to the top of his head.  Patient was unable to provide any history.  In the ER patient is not able to answer any my questions.  Does not follow any commands.  Past Medical History:  Diagnosis Date   Hypertension     Patient Active Problem List   Diagnosis Date Noted   Frontotemporal dementia with behavioral disturbance (HCC) 03/01/2020   Dehydration with hypernatremia 03/01/2020   Essential hypertension 03/01/2020    History reviewed. No pertinent surgical history.     Family History  Problem Relation Age of Onset   Dementia Father    Parkinsonism Father    Dementia Paternal Aunt    Dementia Paternal Uncle    Dementia Paternal Grandfather     Social History   Tobacco Use   Smoking status: Never   Smokeless tobacco: Never  Vaping Use   Vaping Use: Never used  Substance Use Topics   Alcohol use: Never   Drug use: Never    Home Medications Prior to Admission medications   Medication Sig Start Date End Date Taking? Authorizing Provider  acetaminophen (TYLENOL) 325 MG tablet Take 2 tablets (650 mg total) by mouth every 6 (six) hours as needed for mild pain (or Fever >/= 101). Patient taking differently: Take 650 mg by mouth every 6 (six) hours as needed for fever or headache (pain). 03/07/20  Yes Zannie Cove, MD  acetaminophen (TYLENOL) 500 MG tablet Take 500 mg by mouth every 6 (six) hours as needed (fever 99.5-101 F, minor headache, minor discomfort).   Yes [provider]  alum & mag hydroxide-simeth (MAALOX/MYLANTA) 200-200-20 MG/5ML suspension Take 30 mLs by mouth every 6 (six) hours as needed for indigestion or heartburn.   Yes [provider]  clonazePAM (KLONOPIN) 0.5 MG tablet Take 0.5 mg by mouth every 4 (four) hours as needed (severe anxiety).   Yes [provider]  furosemide (LASIX) 20 MG tablet Take 20 mg by mouth daily.   Yes [provider]  guaifenesin (ROBITUSSIN) 100 MG/5ML syrup Take 200 mg by mouth every 6 (six) hours as needed for cough.   Yes [provider]  haloperidol decanoate (HALDOL DECANOATE) 50 MG/ML injection Inject 50 mg into the muscle every 28 (twenty-eight) days.   Yes [provider]  loperamide (IMODIUM) 2 MG capsule Take 2 mg by mouth as needed for diarrhea or loose stools (max 8 doses/ 24 hours).   Yes [provider]  magnesium hydroxide (MILK OF MAGNESIA) 400 MG/5ML suspension Take 30 mLs by mouth at bedtime as needed (constipation).   Yes [provider]  Neomycin-Bacitracin-Polymyxin (TRIPLE ANTIBIOTIC) 3.5-3318690191 OINT Apply 1 application topically daily as needed (minor skin tears/abrasians).   Yes [provider]  nystatin (MYCOSTATIN/NYSTOP) powder Apply 1 application topically in the morning and at bedtime. To groin rash   Yes [provider]  ondansetron (ZOFRAN) 4 MG tablet Take 4 mg by mouth every  6 (six) hours as needed for nausea or vomiting.   Yes [provider]  polyethylene glycol (MIRALAX / GLYCOLAX) 17 g packet Take 17 g by mouth daily as needed for mild constipation. Patient taking differently: Take 17 g by mouth daily as needed (constipation). Mix in 8 oz liquid and drink 03/07/20  Yes Zannie Cove, MD  QUEtiapine (SEROQUEL) 50 MG tablet Take 1 tablet (50 mg total) by mouth at bedtime. 03/07/20  Yes Zannie Cove, MD  risperiDONE (RISPERDAL) 1 MG tablet Take 1 mg by mouth at bedtime.   Yes [provider]  sertraline (ZOLOFT) 100 MG tablet Take 100 mg by mouth daily.   Yes [provider]  traZODone (DESYREL) 100 MG tablet Take 100 mg by mouth at bedtime.   Yes [provider]    Allergies    Patient has no known allergies.  Review of Systems   Review of Systems  All other systems reviewed and are negative.  Physical Exam Updated Vital Signs BP 131/86   Pulse 60   Temp 97.8 F (36.6 C)   Resp 13   Ht 1.829 m (6')   Wt 86 kg   SpO2 97%   BMI 25.71 kg/m   Physical Exam Vitals and nursing note reviewed.  Constitutional:      Appearance: He is well-developed. He is not diaphoretic.  HENT:     Head: Normocephalic.     Comments: Small laceration mid frontal area of the scalp    Right Ear: External ear normal.     Left Ear: External ear normal.  Eyes:     General: No scleral icterus.       Right eye: No discharge.        Left eye: No discharge.     Conjunctiva/sclera: Conjunctivae normal.  Neck:     Trachea: No tracheal deviation.  Cardiovascular:     Rate and Rhythm: Normal rate and regular rhythm.  Pulmonary:     Effort: Pulmonary effort is normal. No respiratory distress.     Breath sounds: Normal breath sounds. No stridor. No wheezing or rales.  Abdominal:     General: Bowel sounds are normal. There is no distension.     Palpations: Abdomen is soft.     Tenderness: There is no abdominal tenderness. There is no guarding or rebound.  Musculoskeletal:        General: No tenderness or deformity.     Cervical back: Neck supple.     Comments: Upper extremity and lower extremities palpated with out pain, full range of motion without apparent discomfort  Skin:    General: Skin is warm and dry.     Findings: No rash.  Neurological:     General: No focal deficit present.     Mental Status: He is alert.     Cranial Nerves: No cranial nerve deficit.     Motor: No abnormal muscle tone or seizure activity.     Comments: Patient nonverbal,  no facial droop noted, not following commands  Psychiatric:        Mood and Affect: Mood normal.    ED Results / Procedures / Treatments   Labs (all labs ordered are listed, but only abnormal results are displayed) Labs Reviewed  CBC - Abnormal; Notable for the following components:      Result Value   RBC 3.97 (*)    Hemoglobin 12.9 (*)    MCV 100.3 (*)    Platelets 123 (*)  All other components within normal limits  BASIC METABOLIC PANEL - Abnormal; Notable for the following components:   Glucose, Bld 101 (*)    Calcium 8.8 (*)    All other components within normal limits    EKG None  Radiology CT HEAD WO CONTRAST ( )  Result Date: 06/29/2021 CLINICAL DATA:  Trauma EXAM: CT HEAD WITHOUT CONTRAST TECHNIQUE: Contiguous axial images were obtained from the base of the skull through the vertex without intravenous contrast. COMPARISON:  Head CT 03/21/2021, brain MRI 03/15/2018 FINDINGS: Brain: There is no evidence of acute intracranial hemorrhage, extra-axial fluid collection, or acute infarct. Global parenchymal volume loss with commensurate enlargement of the ventricular system is unchanged. The ventricles are stable in size. There is no mass lesion. There is no midline shift. Vascular: No hyperdense vessel or unexpected calcification. Skull: Skin staples are noted over the left frontal calvarium. There is no calvarial fracture. There are no suspicious osseous lesions. Sinuses/Orbits: The imaged paranasal sinuses are clear. Other: None. IMPRESSION: No acute intracranial hemorrhage or calvarial fracture. Electronically Signed   By: Lesia Hausen M.D.   On: 06/29/2021 09:59   CT Cervical Spine Wo Contrast  Result Date: 06/29/2021 CLINICAL DATA:  Trauma EXAM: CT CERVICAL SPINE WITHOUT CONTRAST TECHNIQUE: Multidetector CT imaging of the cervical spine was performed without intravenous contrast. Multiplanar CT image reconstructions were also generated. COMPARISON:  None. FINDINGS:  Alignment: There is straightening of the normal cervical spine lordosis with slight reversal of the curvature centered at C3-C4. There is no antero or retrolisthesis. There is no jumped or perched facet. There is no evidence of traumatic malalignment. Skull base and vertebrae: Skull base alignment is maintained. Vertebral body heights are preserved. There is no evidence of acute fracture. Soft tissues and spinal canal: No prevertebral fluid or swelling. No visible canal hematoma. Disc levels: There is multilevel disc space narrowing with associated degenerative endplate change, most advanced at C4-C5 and C5-C6. There is mild multilevel spinal canal stenosis. There is moderate to severe bilateral neural foraminal stenosis at C5-C6 and C6-C7. Upper chest: The imaged lung apices are clear. Other: None. IMPRESSION: 1. No acute fracture or traumatic malalignment of the cervical spine. 2. Multilevel degenerative changes as above, most advanced at C4-C5 through C6-C7. Electronically Signed   By: Lesia Hausen M.D.   On: 06/29/2021 10:11    Procedures .Marland KitchenLaceration Repair  Date/Time: 06/29/2021 9:49 AM Performed by: Linwood Dibbles, MD Authorized by: Linwood Dibbles, MD   Universal protocol:    Patient identity confirmed:  Provided demographic data Anesthesia:    Anesthesia method:  Local infiltration   Local anesthetic:  Lidocaine 2% WITH epi Laceration details:    Location:  Scalp   Scalp location:  Frontal   Length (cm):  2 Exploration:    Limited defect created (wound extended): no   Treatment:    Area cleansed with:  Chlorhexidine (water)   Amount of cleaning:  Standard Skin repair:    Repair method:  Staples   Number of staples:  4 Approximation:    Approximation:  Close Repair type:    Repair type:  Simple Post-procedure details:    Dressing:  Open (no dressing)   Procedure completion:  Tolerated   Medications Ordered in ED Medications  Tdap (BOOSTRIX) injection 0.5 mL (0.5 mLs Intramuscular  Given 06/29/21 0814)  lidocaine-EPINEPHrine (XYLOCAINE W/EPI) 2 %-1:100000 (with pres) injection 20 mL (20 mLs Intradermal Given 06/29/21 5953)    ED Course  I have reviewed the triage vital signs  and the nursing notes.  Pertinent labs & imaging results that were available during my care of the patient were reviewed by me and considered in my medical decision making (see chart for details).  Clinical Course as of 06/29/21 1101  Fri Jun 29, 2021  1050 CBC and metabolic panel unremarkable. [JK]  1050 Head CT and C-spine CT without evidence of acute injury [JK]  1059 Attempted to call spouse on phone listed.  NO answer.  Voice mail not left  [JK]    Clinical Course User Index [JK] Linwood Dibbles, MD   MDM Rules/Calculators/A&P                           Patient presented to the ER for evaluation after a fall.  Patient does have history of dementia at baseline.  Initially the patient was not very responsive but during his stay in the ED he was more alert attempted to grab my hand away when I was stapling the wound.  Patient according to records does appear to be at his baseline.  I was unable to reach the patient's wife.  No signs of serious injury associated with his fall.  There is no findings to suggest acute infection or acute mental status change.  Patient appears stable for discharge back to the nursing facility Final Clinical Impression(s) / ED Diagnoses Final diagnoses:  Laceration of scalp, initial encounter  Fall, initial encounter    Rx / DC Orders ED Discharge Orders     None        Linwood Dibbles, MD 06/29/21 1101

## 2021-06-29 NOTE — ED Triage Notes (Signed)
BIB EMS after walking around facility with trash bag to head s/p unwitnessed fall. Laceration noted to forehead. Hx of dementia and unable to recall event. No BT noted on MAR.

## 2021-06-29 NOTE — ED Notes (Signed)
[facility] called for transportation back to [facility]

## 2021-06-29 NOTE — Discharge Instructions (Addendum)
Staple removal in 7 to 10 days.  Return to the ER for change in behavior, vomiting, worsening symptoms

## 2021-06-29 NOTE — ED Notes (Signed)
Updates provided to wife.  

## 2021-07-02 ENCOUNTER — Other Ambulatory Visit: Payer: Self-pay

## 2021-07-02 ENCOUNTER — Emergency Department (HOSPITAL_COMMUNITY)
Admission: EM | Admit: 2021-07-02 | Discharge: 2021-07-03 | Disposition: A | Discharge status: Home / Self Care | Payer: Medicare Other | Attending: Emergency Medicine | Admitting: Emergency Medicine

## 2021-07-02 DIAGNOSIS — S0101XA Laceration without foreign body of scalp, initial encounter: Secondary | ICD-10-CM | POA: Insufficient documentation

## 2021-07-02 DIAGNOSIS — Y900 Blood alcohol level of less than 20 mg/100 ml: Secondary | ICD-10-CM | POA: Diagnosis not present

## 2021-07-02 DIAGNOSIS — W228XXA Striking against or struck by other objects, initial encounter: Secondary | ICD-10-CM | POA: Insufficient documentation

## 2021-07-02 DIAGNOSIS — Z20822 Contact with and (suspected) exposure to covid-19: Secondary | ICD-10-CM | POA: Insufficient documentation

## 2021-07-02 DIAGNOSIS — S0990XA Unspecified injury of head, initial encounter: Secondary | ICD-10-CM | POA: Diagnosis present

## 2021-07-02 DIAGNOSIS — I10 Essential (primary) hypertension: Secondary | ICD-10-CM | POA: Insufficient documentation

## 2021-07-02 DIAGNOSIS — Z79899 Other long term (current) drug therapy: Secondary | ICD-10-CM | POA: Diagnosis not present

## 2021-07-02 NOTE — ED Triage Notes (Signed)
Patient is from [facility]. Tonight he was hitting his head on the wall. He has staples in from previous incident of doing the same thing. 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 He did not talk to EMS and has not responded to staff here.  Not on any BT.  Vitals stable  130/84-60-98%  CBG: 104

## 2021-07-03 ENCOUNTER — Emergency Department (HOSPITAL_COMMUNITY): Payer: Medicare Other

## 2021-07-03 DIAGNOSIS — S0101XA Laceration without foreign body of scalp, initial encounter: Secondary | ICD-10-CM | POA: Diagnosis not present

## 2021-07-03 LAB — CBC WITH DIFFERENTIAL/PLATELET
Abs Immature Granulocytes: 0.02 10*3/uL (ref 0.00–0.07)
Basophils Absolute: 0 10*3/uL (ref 0.0–0.1)
Basophils Relative: 0 %
Eosinophils Absolute: 0.1 10*3/uL (ref 0.0–0.5)
Eosinophils Relative: 2 %
HCT: 38.1 % — ABNORMAL LOW (ref 39.0–52.0)
Hemoglobin: 12.3 g/dL — ABNORMAL LOW (ref 13.0–17.0)
Immature Granulocytes: 0 %
Lymphocytes Relative: 25 %
Lymphs Abs: 1.7 10*3/uL (ref 0.7–4.0)
MCH: 32 pg (ref 26.0–34.0)
MCHC: 32.3 g/dL (ref 30.0–36.0)
MCV: 99.2 fL (ref 80.0–100.0)
Monocytes Absolute: 0.6 10*3/uL (ref 0.1–1.0)
Monocytes Relative: 8 %
Neutro Abs: 4.5 10*3/uL (ref 1.7–7.7)
Neutrophils Relative %: 65 %
Platelets: 122 10*3/uL — ABNORMAL LOW (ref 150–400)
RBC: 3.84 MIL/uL — ABNORMAL LOW (ref 4.22–5.81)
RDW: 13.6 % (ref 11.5–15.5)
WBC: 6.9 10*3/uL (ref 4.0–10.5)
nRBC: 0 % (ref 0.0–0.2)

## 2021-07-03 LAB — URINALYSIS, ROUTINE W REFLEX MICROSCOPIC
Bilirubin Urine: NEGATIVE
Glucose, UA: NEGATIVE mg/dL
Hgb urine dipstick: NEGATIVE
Ketones, ur: NEGATIVE mg/dL
Leukocytes,Ua: NEGATIVE
Nitrite: NEGATIVE
Protein, ur: NEGATIVE mg/dL
Specific Gravity, Urine: >1.03 — ABNORMAL HIGH (ref 1.005–1.030)
pH: 6 (ref 5.0–8.0)

## 2021-07-03 LAB — COMPREHENSIVE METABOLIC PANEL
ALT: 15 U/L (ref 0–44)
AST: 18 U/L (ref 15–41)
Albumin: 3.5 g/dL (ref 3.5–5.0)
Alkaline Phosphatase: 51 U/L (ref 38–126)
Anion gap: 8 (ref 5–15)
BUN: 22 mg/dL (ref 8–23)
CO2: 29 mmol/L (ref 22–32)
Calcium: 8.6 mg/dL — ABNORMAL LOW (ref 8.9–10.3)
Chloride: 105 mmol/L (ref 98–111)
Creatinine, Ser: 0.83 mg/dL (ref 0.61–1.24)
GFR, Estimated: >60 mL/min (ref >60)
Glucose, Bld: 100 mg/dL — ABNORMAL HIGH (ref 70–99)
Potassium: 4.3 mmol/L (ref 3.5–5.1)
Sodium: 142 mmol/L (ref 135–145)
Total Bilirubin: 0.8 mg/dL (ref 0.3–1.2)
Total Protein: 5.7 g/dL — ABNORMAL LOW (ref 6.5–8.1)

## 2021-07-03 LAB — ETHANOL: Alcohol, Ethyl (B): <10 mg/dL (ref <10)

## 2021-07-03 LAB — RAPID URINE DRUG SCREEN, HOSP PERFORMED
Amphetamines: NOT DETECTED
Barbiturates: NOT DETECTED
Benzodiazepines: NOT DETECTED
Cocaine: NOT DETECTED
Opiates: NOT DETECTED
Tetrahydrocannabinol: NOT DETECTED

## 2021-07-03 LAB — RESP PANEL BY RT-PCR (FLU A&B, COVID) ARPGX2
Influenza A by PCR: NEGATIVE
Influenza B by PCR: NEGATIVE
SARS Coronavirus 2 by RT PCR: NEGATIVE

## 2021-07-03 NOTE — ED Provider Notes (Signed)
[facility]-EMERGENCY DEPT Provider Note   CSN: [id number] Arrival date & time: 07/02/21  2328     History No chief complaint on file.   Micheal [name] is a 65 y.o. male.  65 year old male sent here from his facility secondary to hitting his head on a wall.  Patient is not able to offer any history.  Appears from the records that last he was here he was nonverbal as well and had some staples placed for a laceration on his scalp.  Attempted to contact his wife and was not successful       Past Medical History:  Diagnosis Date   Hypertension     Patient Active Problem List   Diagnosis Date Noted   Frontotemporal dementia with behavioral disturbance (HCC) 03/01/2020   Dehydration with hypernatremia 03/01/2020   Essential hypertension 03/01/2020    No past surgical history on file.     Family History  Problem Relation Age of Onset   Dementia Father    Parkinsonism Father    Dementia Paternal Aunt    Dementia Paternal Uncle    Dementia Paternal Grandfather     Social History   Tobacco Use   Smoking status: Never   Smokeless tobacco: Never  Vaping Use   Vaping Use: Never used  Substance Use Topics   Alcohol use: Never   Drug use: Never    Home Medications Prior to Admission medications   Medication Sig Start Date End Date Taking? Authorizing Provider  acetaminophen (TYLENOL) 325 MG tablet Take 2 tablets (650 mg total) by mouth every 6 (six) hours as needed for mild pain (or Fever >/= 101). 03/07/20  Yes Zannie Cove, MD  acetaminophen (TYLENOL) 500 MG tablet Take 500 mg by mouth every 6 (six) hours as needed (fever 99.5-101 F, minor headache, minor discomfort).   Yes [provider]  clonazePAM (KLONOPIN) 0.5 MG tablet Take 0.5 mg by mouth every 4 (four) hours as needed (severe anxiety).   Yes [provider]  furosemide (LASIX) 20 MG tablet Take 20 mg by mouth daily.   Yes [provider]  guaifenesin  (ROBITUSSIN) 100 MG/5ML syrup Take 200 mg by mouth every 6 (six) hours as needed for cough.   Yes [provider]  haloperidol decanoate (HALDOL DECANOATE) 50 MG/ML injection Inject 50 mg into the muscle every 28 (twenty-eight) days.   Yes [provider]  loperamide (IMODIUM) 2 MG capsule Take 2 mg by mouth as needed for diarrhea or loose stools (max 8 doses/ 24 hours).   Yes [provider]  magnesium hydroxide (MILK OF MAGNESIA) 400 MG/5ML suspension Take 30 mLs by mouth at bedtime as needed (constipation).   Yes [provider]  Neomycin-Bacitracin-Polymyxin (TRIPLE ANTIBIOTIC) [id number] OINT Apply 1 application topically daily as needed (minor skin tears/abrasians).   Yes [provider]  nystatin (MYCOSTATIN/NYSTOP) powder Apply 1 application topically in the morning and at bedtime. To groin rash   Yes [provider]  ondansetron (ZOFRAN) 4 MG tablet Take 4 mg by mouth every 6 (six) hours as needed for nausea or vomiting.   Yes [provider]  polyethylene glycol (MIRALAX / GLYCOLAX) 17 g packet Take 17 g by mouth daily as needed for mild constipation. Patient taking differently: Take 17 g by mouth daily as needed (constipation). Mix in 8 oz liquid and drink 03/07/20  Yes Zannie Cove, MD  QUEtiapine (SEROQUEL) 50 MG tablet Take 1 tablet (50 mg total) by mouth at bedtime.  03/07/20  Yes Zannie Cove, MD  risperiDONE (RISPERDAL) 1 MG tablet Take 1 mg by mouth at bedtime.   Yes [provider]  sertraline (ZOLOFT) 100 MG tablet Take 100 mg by mouth daily.   Yes [provider]  traZODone (DESYREL) 100 MG tablet Take 100 mg by mouth at bedtime.   Yes [provider]  alum & mag hydroxide-simeth (MAALOX/MYLANTA) 200-200-20 MG/5ML suspension Take 30 mLs by mouth every 6 (six) hours as needed for indigestion or heartburn.    [provider]    Allergies    Patient has no known  allergies.  Review of Systems   Review of Systems  All other systems reviewed and are negative.  Physical Exam Updated Vital Signs BP 111/71   Pulse 61   Temp (!) 97.2 F (36.2 C) (Axillary)   Resp 13   SpO2 95%   Physical Exam Vitals and nursing note reviewed.  HENT:     Head: Normocephalic.     Comments: Small amount of dried blood from wound on his scalp. Staples in place.     Nose: No congestion or rhinorrhea.  Eyes:     Pupils: Pupils are equal, round, and reactive to light.  Pulmonary:     Effort: Pulmonary effort is normal.  Abdominal:     General: Abdomen is flat.  Musculoskeletal:        General: Normal range of motion.  Skin:    General: Skin is warm and dry.    ED Results / Procedures / Treatments   Labs (all labs ordered are listed, but only abnormal results are displayed) Labs Reviewed  COMPREHENSIVE METABOLIC PANEL - Abnormal; Notable for the following components:      Result Value   Glucose, Bld 100 (*)    Calcium 8.6 (*)    Total Protein 5.7 (*)    All other components within normal limits  CBC WITH DIFFERENTIAL/PLATELET - Abnormal; Notable for the following components:   RBC 3.84 (*)    Hemoglobin 12.3 (*)    HCT 38.1 (*)    Platelets 122 (*)    All other components within normal limits  URINALYSIS, ROUTINE W REFLEX MICROSCOPIC - Abnormal; Notable for the following components:   Specific Gravity, Urine >1.030 (*)    All other components within normal limits  RESP PANEL BY RT-PCR (FLU A&B, COVID) ARPGX2  ETHANOL  RAPID URINE DRUG SCREEN, HOSP PERFORMED    EKG None  Radiology CT HEAD WO CONTRAST ( )  Result Date: 07/03/2021 CLINICAL DATA:  Head trauma EXAM: CT HEAD WITHOUT CONTRAST CT CERVICAL SPINE WITHOUT CONTRAST TECHNIQUE: Multidetector CT imaging of the head and cervical spine was performed following the standard protocol without intravenous contrast. Multiplanar CT image reconstructions of the cervical spine were also generated.  COMPARISON:  None. FINDINGS: CT HEAD FINDINGS Brain: There is no mass, hemorrhage or extra-axial collection. The size and configuration of the ventricles and extra-axial CSF spaces are normal. The brain parenchyma is normal, without evidence of acute or chronic infarction. Vascular: No abnormal hyperdensity of the major intracranial arteries or dural venous sinuses. No intracranial atherosclerosis. Skull: Left frontal scalp skin staples.  No skull fracture. Sinuses/Orbits: No fluid levels or advanced mucosal thickening of the visualized paranasal sinuses. No mastoid or middle ear effusion. The orbits are normal. CT CERVICAL SPINE FINDINGS Alignment: No static subluxation. Facets are aligned. Occipital condyles are normally positioned. Skull base and vertebrae: No acute fracture. Soft tissues and spinal canal: No prevertebral fluid  or swelling. No visible canal hematoma. Disc levels: No advanced spinal canal or neural foraminal stenosis. Upper chest: No pneumothorax, pulmonary nodule or pleural effusion. Other: Normal visualized paraspinal cervical soft tissues. IMPRESSION: 1. No acute intracranial abnormality. 2. No acute fracture or static subluxation of the cervical spine. Electronically Signed   By: Deatra Robinson M.D.   On: 07/03/2021 01:22   CT Cervical Spine Wo Contrast  Result Date: 07/03/2021 CLINICAL DATA:  Head trauma EXAM: CT HEAD WITHOUT CONTRAST CT CERVICAL SPINE WITHOUT CONTRAST TECHNIQUE: Multidetector CT imaging of the head and cervical spine was performed following the standard protocol without intravenous contrast. Multiplanar CT image reconstructions of the cervical spine were also generated. COMPARISON:  None. FINDINGS: CT HEAD FINDINGS Brain: There is no mass, hemorrhage or extra-axial collection. The size and configuration of the ventricles and extra-axial CSF spaces are normal. The brain parenchyma is normal, without evidence of acute or chronic infarction. Vascular: No abnormal  hyperdensity of the major intracranial arteries or dural venous sinuses. No intracranial atherosclerosis. Skull: Left frontal scalp skin staples.  No skull fracture. Sinuses/Orbits: No fluid levels or advanced mucosal thickening of the visualized paranasal sinuses. No mastoid or middle ear effusion. The orbits are normal. CT CERVICAL SPINE FINDINGS Alignment: No static subluxation. Facets are aligned. Occipital condyles are normally positioned. Skull base and vertebrae: No acute fracture. Soft tissues and spinal canal: No prevertebral fluid or swelling. No visible canal hematoma. Disc levels: No advanced spinal canal or neural foraminal stenosis. Upper chest: No pneumothorax, pulmonary nodule or pleural effusion. Other: Normal visualized paraspinal cervical soft tissues. IMPRESSION: 1. No acute intracranial abnormality. 2. No acute fracture or static subluxation of the cervical spine. Electronically Signed   By: Deatra Robinson M.D.   On: 07/03/2021 01:22    Procedures Procedures   Medications Ordered in ED Medications - No data to display  ED Course  I have reviewed the triage vital signs and the nursing notes.  Pertinent labs & imaging results that were available during my care of the patient were reviewed by me and considered in my medical decision making (see chart for details).    MDM Rules/Calculators/A&P                         65 yo M with a fall. Negative CT. No red flags to be concerned for occult head bleed. Injuries cared for by nursing. Couldn't contact family, will dc to facility.    Final Clinical Impression(s) / ED Diagnoses Final diagnoses:  Injury of head, initial encounter    Rx / DC Orders ED Discharge Orders     None        Sakura Denis, Barbara Cower, MD 07/03/21 [phone]

## 2021-08-05 ENCOUNTER — Emergency Department (HOSPITAL_COMMUNITY): Payer: Medicare Other

## 2021-08-05 ENCOUNTER — Encounter (HOSPITAL_COMMUNITY): Payer: Self-pay

## 2021-08-05 ENCOUNTER — Inpatient Hospital Stay (HOSPITAL_COMMUNITY)
Admission: EM | Admit: 2021-08-05 | Discharge: 2021-08-14 | DRG: 056 | Disposition: A | Discharge status: Skilled Nursing Facility | Payer: Medicare Other | Source: Skilled Nursing Facility | Attending: Internal Medicine | Admitting: Internal Medicine

## 2021-08-05 ENCOUNTER — Other Ambulatory Visit: Payer: Self-pay

## 2021-08-05 DIAGNOSIS — Z820 Family history of epilepsy and other diseases of the nervous system: Secondary | ICD-10-CM

## 2021-08-05 DIAGNOSIS — E876 Hypokalemia: Secondary | ICD-10-CM | POA: Diagnosis present

## 2021-08-05 DIAGNOSIS — J101 Influenza due to other identified influenza virus with other respiratory manifestations: Secondary | ICD-10-CM | POA: Diagnosis present

## 2021-08-05 DIAGNOSIS — Z751 Person awaiting admission to adequate facility elsewhere: Secondary | ICD-10-CM

## 2021-08-05 DIAGNOSIS — G3109 Other frontotemporal dementia: Secondary | ICD-10-CM | POA: Diagnosis not present

## 2021-08-05 DIAGNOSIS — G9341 Metabolic encephalopathy: Secondary | ICD-10-CM | POA: Diagnosis present

## 2021-08-05 DIAGNOSIS — F02818 Dementia in other diseases classified elsewhere, unspecified severity, with other behavioral disturbance: Secondary | ICD-10-CM | POA: Diagnosis present

## 2021-08-05 DIAGNOSIS — E538 Deficiency of other specified B group vitamins: Secondary | ICD-10-CM | POA: Diagnosis present

## 2021-08-05 DIAGNOSIS — Z66 Do not resuscitate: Secondary | ICD-10-CM | POA: Diagnosis present

## 2021-08-05 DIAGNOSIS — F05 Delirium due to known physiological condition: Secondary | ICD-10-CM | POA: Diagnosis present

## 2021-08-05 DIAGNOSIS — R748 Abnormal levels of other serum enzymes: Secondary | ICD-10-CM | POA: Diagnosis present

## 2021-08-05 DIAGNOSIS — G4710 Hypersomnia, unspecified: Secondary | ICD-10-CM | POA: Diagnosis present

## 2021-08-05 DIAGNOSIS — I2699 Other pulmonary embolism without acute cor pulmonale: Secondary | ICD-10-CM | POA: Diagnosis present

## 2021-08-05 DIAGNOSIS — Z79899 Other long term (current) drug therapy: Secondary | ICD-10-CM

## 2021-08-05 DIAGNOSIS — I82431 Acute embolism and thrombosis of right popliteal vein: Secondary | ICD-10-CM | POA: Diagnosis present

## 2021-08-05 DIAGNOSIS — F0283 Dementia in other diseases classified elsewhere, unspecified severity, with mood disturbance: Secondary | ICD-10-CM | POA: Diagnosis present

## 2021-08-05 DIAGNOSIS — Z20822 Contact with and (suspected) exposure to covid-19: Secondary | ICD-10-CM | POA: Diagnosis present

## 2021-08-05 DIAGNOSIS — F329 Major depressive disorder, single episode, unspecified: Secondary | ICD-10-CM | POA: Diagnosis present

## 2021-08-05 DIAGNOSIS — F0284 Dementia in other diseases classified elsewhere, unspecified severity, with anxiety: Secondary | ICD-10-CM | POA: Diagnosis present

## 2021-08-05 DIAGNOSIS — R4182 Altered mental status, unspecified: Secondary | ICD-10-CM | POA: Diagnosis present

## 2021-08-05 DIAGNOSIS — G9340 Encephalopathy, unspecified: Secondary | ICD-10-CM | POA: Diagnosis present

## 2021-08-05 DIAGNOSIS — R2681 Unsteadiness on feet: Secondary | ICD-10-CM | POA: Diagnosis not present

## 2021-08-05 DIAGNOSIS — I10 Essential (primary) hypertension: Secondary | ICD-10-CM | POA: Diagnosis present

## 2021-08-05 DIAGNOSIS — R29715 NIHSS score 15: Secondary | ICD-10-CM | POA: Diagnosis present

## 2021-08-05 DIAGNOSIS — R531 Weakness: Secondary | ICD-10-CM

## 2021-08-05 LAB — CBC
HCT: 38 % — ABNORMAL LOW (ref 39.0–52.0)
Hemoglobin: 12.7 g/dL — ABNORMAL LOW (ref 13.0–17.0)
MCH: 32.8 pg (ref 26.0–34.0)
MCHC: 33.4 g/dL (ref 30.0–36.0)
MCV: 98.2 fL (ref 80.0–100.0)
Platelets: 148 10*3/uL — ABNORMAL LOW (ref 150–400)
RBC: 3.87 MIL/uL — ABNORMAL LOW (ref 4.22–5.81)
RDW: 13.2 % (ref 11.5–15.5)
WBC: 7.4 10*3/uL (ref 4.0–10.5)
nRBC: 0 % (ref 0.0–0.2)

## 2021-08-05 LAB — COMPREHENSIVE METABOLIC PANEL
ALT: 15 U/L (ref 0–44)
AST: 27 U/L (ref 15–41)
Albumin: 3.6 g/dL (ref 3.5–5.0)
Alkaline Phosphatase: 63 U/L (ref 38–126)
Anion gap: 8 (ref 5–15)
BUN: 18 mg/dL (ref 8–23)
CO2: 30 mmol/L (ref 22–32)
Calcium: 8.6 mg/dL — ABNORMAL LOW (ref 8.9–10.3)
Chloride: 102 mmol/L (ref 98–111)
Creatinine, Ser: 0.62 mg/dL (ref 0.61–1.24)
GFR, Estimated: >60 mL/min (ref >60)
Glucose, Bld: 135 mg/dL — ABNORMAL HIGH (ref 70–99)
Potassium: 3.3 mmol/L — ABNORMAL LOW (ref 3.5–5.1)
Sodium: 140 mmol/L (ref 135–145)
Total Bilirubin: 0.6 mg/dL (ref 0.3–1.2)
Total Protein: 6.1 g/dL — ABNORMAL LOW (ref 6.5–8.1)

## 2021-08-05 LAB — I-STAT CHEM 8, ED
BUN: 16 mg/dL (ref 8–23)
Calcium, Ion: 1.17 mmol/L (ref 1.15–1.40)
Chloride: 98 mmol/L (ref 98–111)
Creatinine, Ser: 0.7 mg/dL (ref 0.61–1.24)
Glucose, Bld: 131 mg/dL — ABNORMAL HIGH (ref 70–99)
HCT: 37 % — ABNORMAL LOW (ref 39.0–52.0)
Hemoglobin: 12.6 g/dL — ABNORMAL LOW (ref 13.0–17.0)
Potassium: 3.3 mmol/L — ABNORMAL LOW (ref 3.5–5.1)
Sodium: 140 mmol/L (ref 135–145)
TCO2: 31 mmol/L (ref 22–32)

## 2021-08-05 LAB — DIFFERENTIAL
Abs Immature Granulocytes: 0.02 10*3/uL (ref 0.00–0.07)
Basophils Absolute: 0 10*3/uL (ref 0.0–0.1)
Basophils Relative: 0 %
Eosinophils Absolute: 0 10*3/uL (ref 0.0–0.5)
Eosinophils Relative: 0 %
Immature Granulocytes: 0 %
Lymphocytes Relative: 16 %
Lymphs Abs: 1.2 10*3/uL (ref 0.7–4.0)
Monocytes Absolute: 0.7 10*3/uL (ref 0.1–1.0)
Monocytes Relative: 10 %
Neutro Abs: 5.4 10*3/uL (ref 1.7–7.7)
Neutrophils Relative %: 74 %

## 2021-08-05 LAB — CBG MONITORING, ED: Glucose-Capillary: 132 mg/dL — ABNORMAL HIGH (ref 70–99)

## 2021-08-05 LAB — APTT: aPTT: 31 seconds (ref 24–36)

## 2021-08-05 LAB — PROTIME-INR
INR: 1 (ref 0.8–1.2)
Prothrombin Time: 13.5 seconds (ref 11.4–15.2)

## 2021-08-05 LAB — RESP PANEL BY RT-PCR (FLU A&B, COVID) ARPGX2
Influenza A by PCR: NEGATIVE
Influenza B by PCR: NEGATIVE
SARS Coronavirus 2 by RT PCR: NEGATIVE

## 2021-08-05 MED ORDER — SODIUM CHLORIDE 0.9 % IV SOLN: 100.0000 mL/h | INTRAVENOUS | Status: DC

## 2021-08-05 MED ORDER — SODIUM CHLORIDE 0.9 % IV BOLUS
500.0000 mL | Freq: Once | INTRAVENOUS | Status: AC
  Administered 2021-08-05: 500 mL via INTRAVENOUS

## 2021-08-05 MED ORDER — IOHEXOL 350 MG/ML SOLN
100.0000 mL | Freq: Once | INTRAVENOUS | Status: AC | PRN
  Administered 2021-08-05: 100 mL via INTRAVENOUS

## 2021-08-05 NOTE — ED Provider Notes (Signed)
[facility]-EMERGENCY DEPT Provider Note   CSN: [id number] Arrival date & time: 08/05/21  1837     History Chief Complaint  Patient presents with   Altered Mental Status    Micheal [name] is a 65 y.o. male.  HPI Patient presents from nursing facility with staff concern of difficulty with gait, patient leaning to his left.  Patient has dementia, is largely noninteractive, level 5 caveat. Per nursing report via EMS, the patient has been different for at least 24 hours, without obvious time of onset.  No reported fall, fever, change in interactivity. EMS reports patient was hemodynamically unremarkable in transport.    Past Medical History:  Diagnosis Date   Hypertension     Patient Active Problem List   Diagnosis Date Noted   Frontotemporal dementia with behavioral disturbance (HCC) 03/01/2020   Dehydration with hypernatremia 03/01/2020   Essential hypertension 03/01/2020    No past surgical history on file.     Family History  Problem Relation Age of Onset   Dementia Father    Parkinsonism Father    Dementia Paternal Aunt    Dementia Paternal Uncle    Dementia Paternal Grandfather     Social History   Tobacco Use   Smoking status: Never   Smokeless tobacco: Never  Vaping Use   Vaping Use: Never used  Substance Use Topics   Alcohol use: Never   Drug use: Never    Home Medications Prior to Admission medications   Medication Sig Start Date End Date Taking? Authorizing Provider  acetaminophen (TYLENOL) 325 MG tablet Take 2 tablets (650 mg total) by mouth every 6 (six) hours as needed for mild pain (or Fever >/= 101). 03/07/20   Zannie Cove, MD  acetaminophen (TYLENOL) 500 MG tablet Take 500 mg by mouth every 6 (six) hours as needed (fever 99.5-101 F, minor headache, minor discomfort).    [provider]  alum & mag hydroxide-simeth (MAALOX/MYLANTA) 200-200-20 MG/5ML suspension Take 30 mLs by mouth every 6 (six) hours as  needed for indigestion or heartburn.    [provider]  clonazePAM (KLONOPIN) 0.5 MG tablet Take 0.5 mg by mouth every 4 (four) hours as needed (severe anxiety).    [provider]  furosemide (LASIX) 20 MG tablet Take 20 mg by mouth daily.    [provider]  guaifenesin (ROBITUSSIN) 100 MG/5ML syrup Take 200 mg by mouth every 6 (six) hours as needed for cough.    [provider]  haloperidol decanoate (HALDOL DECANOATE) 50 MG/ML injection Inject 50 mg into the muscle every 28 (twenty-eight) days.    [provider]  loperamide (IMODIUM) 2 MG capsule Take 2 mg by mouth as needed for diarrhea or loose stools (max 8 doses/ 24 hours).    [provider]  magnesium hydroxide (MILK OF MAGNESIA) 400 MG/5ML suspension Take 30 mLs by mouth at bedtime as needed (constipation).    [provider]  Neomycin-Bacitracin-Polymyxin (TRIPLE ANTIBIOTIC) [phone] OINT Apply 1 application topically daily as needed (minor skin tears/abrasians).    [provider]  nystatin (MYCOSTATIN/NYSTOP) powder Apply 1 application topically in the morning and at bedtime. To groin rash    [provider]  ondansetron (ZOFRAN) 4 MG tablet Take 4 mg by mouth every 6 (six) hours as needed for nausea or vomiting.    [provider]  polyethylene glycol (MIRALAX / GLYCOLAX) 17 g packet Take 17 g by mouth daily as needed for mild constipation. Patient taking differently:  Take 17 g by mouth daily as needed (constipation). Mix in 8 oz liquid and drink 03/07/20   Zannie Cove, MD  QUEtiapine (SEROQUEL) 50 MG tablet Take 1 tablet (50 mg total) by mouth at bedtime. 03/07/20   Zannie Cove, MD  risperiDONE (RISPERDAL) 1 MG tablet Take 1 mg by mouth at bedtime.    [provider]  sertraline (ZOLOFT) 100 MG tablet Take 100 mg by mouth daily.    [provider]  traZODone (DESYREL) 100 MG tablet Take 100 mg by mouth at bedtime.     [provider]    Allergies    Patient has no known allergies.  Review of Systems   Review of Systems  Unable to perform ROS: Dementia   Physical Exam Updated Vital Signs BP 139/78 (BP Location: Left Arm)   Pulse 77   Temp 97.8 F (36.6 C) (Oral)   Resp 16   SpO2 96%   Physical Exam Vitals and nursing note reviewed.  Constitutional:      Appearance: He is well-developed.     Comments: Frail appearing elderly male essentially noninteractive  HENT:     Head: Normocephalic and atraumatic.  Eyes:     Conjunctiva/sclera: Conjunctivae normal.  Cardiovascular:     Rate and Rhythm: Normal rate and regular rhythm.  Pulmonary:     Effort: Pulmonary effort is normal. Tachypnea present. No respiratory distress.     Breath sounds: No stridor.  Abdominal:     General: There is no distension.  Skin:    General: Skin is warm and dry.  Neurological:     Comments: Does not participate in exam, moves extremities spontaneously, minimally.  No gross facial asymmetry.  Does not track visually.  Psychiatric:        Cognition and Memory: Cognition is impaired. Memory is impaired.    ED Results / Procedures / Treatments   Labs (all labs ordered are listed, but only abnormal results are displayed) Labs Reviewed  CBC - Abnormal; Notable for the following components:      Result Value   RBC 3.87 (*)    Hemoglobin 12.7 (*)    HCT 38.0 (*)    Platelets 148 (*)    All other components within normal limits  COMPREHENSIVE METABOLIC PANEL - Abnormal; Notable for the following components:   Potassium 3.3 (*)    Glucose, Bld 135 (*)    Calcium 8.6 (*)    Total Protein 6.1 (*)    All other components within normal limits  I-STAT CHEM 8, ED - Abnormal; Notable for the following components:   Potassium 3.3 (*)    Glucose, Bld 131 (*)    Hemoglobin 12.6 (*)    HCT 37.0 (*)    All other components within normal limits  CBG MONITORING, ED - Abnormal; Notable for the following  components:   Glucose-Capillary 132 (*)    All other components within normal limits  RESP PANEL BY RT-PCR (FLU A&B, COVID) ARPGX2  PROTIME-INR  APTT  DIFFERENTIAL  RAPID URINE DRUG SCREEN, HOSP PERFORMED  URINALYSIS, ROUTINE W REFLEX MICROSCOPIC    EKG EKG Interpretation  Date/Time:  Sunday August 05 2021 19:32:30 EDT Ventricular Rate:  87 PR Interval:  161 QRS Duration: 101 QT Interval:  388 QTC Calculation: 467 R Axis:   81 Text Interpretation: Sinus rhythm Borderline right axis deviation Artifact Abnormal ECG Confirmed by Gerhard Munch [phone]) on 08/05/2021 8:02:20 PM  Radiology CT HEAD WO CONTRAST  Result Date: 08/05/2021  CLINICAL DATA:  Neuro deficit, acute, stroke suspected. Gait abnormality, lean to the left. Dementia. EXAM: CT HEAD WITHOUT CONTRAST TECHNIQUE: Contiguous axial images were obtained from the base of the skull through the vertex without intravenous contrast. COMPARISON:  07/03/2021 FINDINGS: Brain: Normal anatomic configuration. Parenchymal volume loss is commensurate with the patient's age. Mild periventricular white matter changes are present likely reflecting the sequela of small vessel ischemia. No abnormal intra or extra-axial mass lesion or fluid collection. No abnormal mass effect or midline shift. No evidence of acute intracranial hemorrhage or infarct. Ventricular size is normal. Cerebellum unremarkable. Vascular: No asymmetric hyperdense vasculature at the skull base. Skull: Intact Sinuses/Orbits: Paranasal sinuses are clear. Orbits are unremarkable. Other: Mastoid air cells and middle ear cavities are clear. IMPRESSION: No acute intracranial abnormality.  Mild senescent change. Electronically Signed   By: Helyn Numbers M.D.   On: 08/05/2021 20:15    Procedures Procedures   Medications Ordered in ED Medications  sodium chloride 0.9 % bolus 500 mL (0 mLs Intravenous Stopped 08/05/21 2127)    Followed by  0.9 %  sodium chloride infusion (has no  administration in time range)  iohexol (OMNIPAQUE) 350 MG/ML injection 100 mL (100 mLs Intravenous Contrast Given 08/05/21 2203)    ED Course  I have reviewed the triage vital signs and the nursing notes.  Pertinent labs & imaging results that were available during my care of the patient were reviewed by me and considered in my medical decision making (see chart for details).  Update: I discussed the patient's case with the neurology colleague, who has been in assessment remotely.  Indication for CT angiography to complement prior CT scan which I reviewed, was reassuring, no acute intracranial abnormality.   11:57 PM Patient in similar condition. Patient continues to have difficult to completely discern neurologic exam, but with report of new gait difficulty compared to baseline, duration greater than 1 day he will require admission for further monitoring, management, ongoing stroke evaluation, consideration of altered mental status.  Initial findings reassuring, no substantial lab abnormalities, COVID-negative, flu negative. On signout CT angiography results pending, MRI anticipated in the morning.  MDM Rules/Calculators/A&P MDM Number of Diagnoses or Management Options Weakness: new, needed workup   Amount and/or Complexity of Data Reviewed Clinical lab tests: ordered and reviewed Tests in the radiology section of CPT: ordered and reviewed Tests in the medicine section of CPT: reviewed and ordered Decide to obtain previous medical records or to obtain history from someone other than the patient: yes Review and summarize past medical records: yes Discuss the patient with other providers: yes Independent visualization of images, tracings, or specimens: yes  Risk of Complications, Morbidity, and/or Mortality Presenting problems: high Diagnostic procedures: high Management options: high  Critical Care Total time providing critical care: < 30 minutes  Patient Progress Patient  progress: stable   Final Clinical Impression(s) / ED Diagnoses Final diagnoses:  Weakness     Gerhard Munch, MD 08/05/21 2359

## 2021-08-05 NOTE — Consult Note (Addendum)
[facility] Tele[facility]urology Consult Services  Stat Consult  Patient Name:   Micheal [name], Micheal [name] Date of Birth:   [birth year] Identification Number:   MRN - [id number] Date of Service:   08/05/2021 19:18:01  Diagnosis:       G93.49 - Encephalopathy Multifactorial  Impression 65 yr old man, hx of dementia, HTN, comes from nursing home, with EMS for change in mental status- less responsive and leaning to the left, per report. In the ER, he is less responsive, and not waking up and not able to follow commands. I called [name], his nursing place and spoke with caregiver [name], who knows patient. She tells me his baseline is he walks independently, he needs help with daily function, but able to feed himself. He has limited speech, and dx with frontotemporal dementia, he can say few words, but not complete sentences. He has no hx of seizures. He is on several meds including clonazepam, seroquel, haldol as needed. Per nursing home, he was walking last night sometime, and awakened today in morning with lethargy, and not moving / walking as steadily, so they called EMS. NIH 15, exam limited- not opening eyes not able to follow commands, and not answering questions. Exam limited, Pupils are equal, reactive b/l, he withdraws to pain pressure in all 4 ext. CT head per radiology negative, I see ? L basal ganglia calcification versus r/o cva.  Diff Dx: toxic , metabolic, infectious encephalopathy, r/o cva, seizure, other. Out of IV thrombolytic window, last known well time not clear, but some time last night 10/29 per nursing home, and modified rankin 3. Rec: - CTA head, neck r/o LVO - cva work up - MRI brain - cont home meds - possible EEG - Encephalopathy work up, r/o, treat infection - Neuro fup d/w pt, RN, nursing home d/w Er Dr Jeraldine Loots the above recs.  CT HEAD: As Per Radiologist CT Head Showed No Acute Hemorrhage or Acute Core Infarct  Advanced Imaging: CTA Head and  Neck Ordered:   Our recommendations are outlined below.  Diagnostic Studies: Recommend MRI brain without contrast Transthoracic Echo with bubble study, if available  Laboratory Studies: Recommend Lipid panel Hemoglobin A1c  Nursing Recommendations: Telemetry, IV Fluids, avoid dextrose containing fluids, Maintain euglycemia Neuro checks q4 hrs x 24 hrs and then per shift Head of bed 30 degrees  Consultations: Recommend Speech therapy if failed dysphagia screen Physical therapy/Occupational therapy  DVT Prophylaxis: Choice of Primary Team  Disposition: Neurology will follow   Metrics: [facility] Notification Time: 08/05/2021 19:16:22 Stamp Time: 08/05/2021 19:18:01 Callback Response Time: 08/05/2021 19:22:53   ----------------------------------------------------------------------------------------------------  Chief Complaint: change in mental status  History of Present Illness: Patient is a 65 year old Male.  65 yr old man, hx of dementia, HTN, comes from nursing home, with EMS for change in mental status- less responsive and leaning to the left, per report. In the ER, he is less responsive, and not waking up and not able to follow commands. I called [name], his nursing place and spoke with caregiver [name], who knows patient. She tells me his baseline is he walks independently, he needs help with daily function, but able to feed himself. He has limited speech, and dx with frontotemporal dementia, he can say few words, but not complete sentences. He has no hx of seizures. He is on several meds including clonazepam, seroquel, haldol as needed. Per nursing home, he was walking last night sometime, and awakened today in morning with lethargy, and not moving / walking  as steadily, so they called EMS.   Anticoagulant use:  No  Antiplatelet use: No   Examination: BP(153/93), Pulse(101), Blood Glucose(135) 1A: Level of Consciousness - Movements to Pain +  2 1B: Ask Month and Age - Aphasic + 2 1C: Blink Eyes & Squeeze Hands - Performs 0 Tasks + 2 2: Test Horizontal Extraocular Movements - Normal + 0 3: Test Visual Fields - No Visual Loss + 0 4: Test Facial Palsy (Use Grimace if Obtunded) - Normal symmetry + 0 5A: Test Left Arm Motor Drift - Drift, but doesn't hit bed + 1 5B: Test Right Arm Motor Drift - Drift, but doesn't hit bed + 1 6A: Test Left Leg Motor Drift - Drift, hits bed + 2 6B: Test Right Leg Motor Drift - Drift, hits bed + 2 7: Test Limb Ataxia (FNF/Heel-Shin) - Does Not Understand + 0 8: Test Sensation - Normal; No sensory loss + 0 9: Test Language/Aphasia - Mute/Global Aphasia: No Usable Speech/Auditory Comprehension + 3 10: Test Dysarthria - Intubated/Unable to Test + 0 11: Test Extinction/Inattention - No abnormality + 0  NIHSS Score: 15 NIHSS Free Text : not opening eyes not able to follow commands, and not answering questions. Exam limited, Pupils are equal, reactive b/l, he withdraws to pain pressure in all 4 ext.     Patient / Family was informed the Neurology Consult would occur via TeleHealth consult by way of interactive audio and video telecommunications and consented to receiving care in this manner.  Patient is being evaluated for possible acute neurologic impairment and high probability of imminent or life - threatening deterioration.I spent total of 35 minutes providing care to this patient, including time for face to face visit via telemedicine, review of medical records, imaging studies and discussion of findings with providers, the patient and / or family.   Dr Rubye Oaks   [facility] [phone]  Case [id number] CTA head and neck read from radiology pending. D/w ER Dr :Jeraldine Loots who informed me he will kindly let me know if CTA is abnormal . Awaiting radiology read . Thank you.  CTA head and neck no LVO or sig stenosis.

## 2021-08-05 NOTE — ED Triage Notes (Signed)
Staff from SNF state that pt is leaning to the left side and it has been happening for 24 hrs. Pt with history of dementia and is more sluggish to response than is already altered baseline.

## 2021-08-05 NOTE — Progress Notes (Signed)
Telestroke activated for this patient for leaning to L > 24 hrs. Called Dr. Jeraldine Loots EDP who confirmed that this should not be a code stroke and patient is well outside the window to activate. Telestroke canceled.  Bing Neighbors, MD [facility] [phone]  If 7pm- 7am, please page neurology on call as listed in AMION.

## 2021-08-05 NOTE — H&P (Signed)
History and Physical    Micheal [name] PXT:[id number] DOB: 27-Apr-1956 DOA: 08/05/2021  PCP: Micheal Funk, MD  Patient coming from: [facility]  I have personally briefly reviewed patient's old medical records in [facility]  Chief Complaint: Abnormal gait  HPI: Patient Micheal [name] is a 65 y.o. male with medical history significant for frontotemporal dementia with behavioral disturbance, hypertension who presented to the ED from his nursing home for evaluation of abnormal gait compared to baseline.  He is unable to provide history due to dementia and hypersomnolence on admission and is otherwise obtained by EDP and chart review.  Patient currently resides at [facility] nursing facility.  He was noted to have new change in mental status in which he has been less responsive and leaning towards the left.  Per neurology's conversation with patient's caregiver at his nursing facility patient can walk independently at baseline, needs help with daily function, but able to feed himself.  Speech is limited due to his frontotemporal dementia and he can say a few words but not complete sentences.  He was apparently walking last night sometime been awakened today in the morning with lethargy and not moving/walking as steadily so EMS were called and he was brought to the ED.  He has been hypersomnolent, unarousable, not following commands since arrival.  ED Course:  Initial vitals showed BP 153/93, pulse 93, RR 26, temp 97.8 F, SPO2 95% on room air.  Labs show sodium 140, potassium 3.3, bicarb 30, BUN 18, creatinine 0.62, serum glucose 135, LFTs within normal limits, WBC 7.4, hemoglobin 12.7, platelets 148,000.  Urinalysis and UDS ordered and pending collection.  SARS-CoV-2 and influenza PCR's are negative.  CT head without contrast is negative for acute intracranial abnormality.  Mild senescent change noted.  Patient was given 500 cc normal saline.  Teleneurology were consulted.     CTA head and neck were obtained and were negative studies.  There is question of focal filling defect within right upper lobe segmental pulmonary artery branches suspicious for possible acute PE.  CTA chest PE study is ordered and pending.  Neurology recommended admission for further stroke work-up.  The hospitalist service was consulted to admit for further evaluation and management.  Review of Systems: All systems reviewed and are negative except as documented in history of present illness above.   Past Medical History:  Diagnosis Date   Hypertension     No past surgical history on file.  Social History:  reports that he has never smoked. He has never used smokeless tobacco. He reports that he does not drink alcohol and does not use drugs.  No Known Allergies  Family History  Problem Relation Age of Onset   Dementia Father    Parkinsonism Father    Dementia Paternal Aunt    Dementia Paternal Uncle    Dementia Paternal Grandfather      Prior to Admission medications   Medication Sig Start Date End Date Taking? Authorizing Provider  acetaminophen (TYLENOL) 325 MG tablet Take 2 tablets (650 mg total) by mouth every 6 (six) hours as needed for mild pain (or Fever >/= 101). 03/07/20   Zannie Cove, MD  acetaminophen (TYLENOL) 500 MG tablet Take 500 mg by mouth every 6 (six) hours as needed (fever 99.5-101 F, minor headache, minor discomfort).    [provider]  alum & mag hydroxide-simeth (MAALOX/MYLANTA) 200-200-20 MG/5ML suspension Take 30 mLs by mouth every 6 (six) hours as needed for indigestion or heartburn.  [provider]  clonazePAM (KLONOPIN) 0.5 MG tablet Take 0.5 mg by mouth every 4 (four) hours as needed (severe anxiety).    [provider]  furosemide (LASIX) 20 MG tablet Take 20 mg by mouth daily.    [provider]  guaifenesin (ROBITUSSIN) 100 MG/5ML syrup Take 200 mg by mouth every 6 (six) hours as needed for cough.     [provider]  haloperidol decanoate (HALDOL DECANOATE) 50 MG/ML injection Inject 50 mg into the muscle every 28 (twenty-eight) days.    [provider]  loperamide (IMODIUM) 2 MG capsule Take 2 mg by mouth as needed for diarrhea or loose stools (max 8 doses/ 24 hours).    [provider]  magnesium hydroxide (MILK OF MAGNESIA) 400 MG/5ML suspension Take 30 mLs by mouth at bedtime as needed (constipation).    [provider]  Neomycin-Bacitracin-Polymyxin (TRIPLE ANTIBIOTIC) [phone] OINT Apply 1 application topically daily as needed (minor skin tears/abrasians).    [provider]  nystatin (MYCOSTATIN/NYSTOP) powder Apply 1 application topically in the morning and at bedtime. To groin rash    [provider]  ondansetron (ZOFRAN) 4 MG tablet Take 4 mg by mouth every 6 (six) hours as needed for nausea or vomiting.    [provider]  polyethylene glycol (MIRALAX / GLYCOLAX) 17 g packet Take 17 g by mouth daily as needed for mild constipation. Patient taking differently: Take 17 g by mouth daily as needed (constipation). Mix in 8 oz liquid and drink 03/07/20   Zannie Cove, MD  QUEtiapine (SEROQUEL) 50 MG tablet Take 1 tablet (50 mg total) by mouth at bedtime. 03/07/20   Zannie Cove, MD  risperiDONE (RISPERDAL) 1 MG tablet Take 1 mg by mouth at bedtime.    [provider]  sertraline (ZOLOFT) 100 MG tablet Take 100 mg by mouth daily.    [provider]  traZODone (DESYREL) 100 MG tablet Take 100 mg by mouth at bedtime.    [provider]    Physical Exam: Vitals:   08/05/21 2130 08/05/21 2200 08/05/21 2330 08/05/21 2351  BP: 133/81 137/90 (!) 142/81 139/78  Pulse: 90 95 71 77  Resp:    16  Temp:      TempSrc:      SpO2: 100% 97% 97% 96%   Exam limited due to hypersomnolence. Constitutional: Elderly man resting in bed, hypersomnolent, unarousable Eyes: PERRL, lids and conjunctivae  normal ENMT: Mucous membranes are dry. Posterior pharynx clear of any exudate or lesions. Neck: normal, supple, no masses. Respiratory: clear to auscultation anteriorly. Cardiovascular: Regular rate and rhythm, no murmurs / rubs / gallops. No extremity edema. 2+ pedal pulses. Abdomen: no obvious tenderness with palpation.  No masses palpated. No hepatosplenomegaly. Bowel sounds positive.  Musculoskeletal: no clubbing / cyanosis. No joint deformity upper and lower extremities.  Withdraws both legs to noxious stimuli. Skin: no rashes, lesions, ulcers. No induration Neurologic: Limited exam due to hypersomnolence.  Not following commands.  Withdraws extremities to noxious stimuli. Psychiatric: Hypersomnolent, unarousable, not following commands.  Labs on Admission: I have personally reviewed following labs and imaging studies  CBC: Recent Labs  Lab 08/05/21 1924 08/05/21 1929  WBC 7.4  --   NEUTROABS 5.4  --   HGB 12.7* 12.6*  HCT 38.0* 37.0*  MCV 98.2  --   PLT 148*  --    Basic Metabolic Panel: Recent Labs  Lab 08/05/21 1924 08/05/21 1929  NA 140 140  K 3.3* 3.3*  CL 102 98  CO2 30  --   GLUCOSE 135* 131*  BUN 18 16  CREATININE 0.62 0.70  CALCIUM 8.6*  --    GFR: CrCl cannot be calculated (Unknown ideal weight.). Liver Function Tests: Recent Labs  Lab 08/05/21 1924  AST 27  ALT 15  ALKPHOS 63  BILITOT 0.6  PROT 6.1*  ALBUMIN 3.6   No results for input(s): LIPASE, AMYLASE in the last 168 hours. No results for input(s): AMMONIA in the last 168 hours. Coagulation Profile: Recent Labs  Lab 08/05/21 1924  INR 1.0   Cardiac Enzymes: No results for input(s): CKTOTAL, CKMB, CKMBINDEX, TROPONINI in the last 168 hours. BNP (last 3 results) No results for input(s): PROBNP in the last 8760 hours. HbA1C: No results for input(s): HGBA1C in the last 72 hours. CBG: Recent Labs  Lab 08/05/21 1920  GLUCAP 132*   Lipid Profile: No results for input(s): CHOL, HDL,  LDLCALC, TRIG, CHOLHDL, LDLDIRECT in the last 72 hours. Thyroid Function Tests: No results for input(s): TSH, T4TOTAL, FREET4, T3FREE, THYROIDAB in the last 72 hours. Anemia Panel: No results for input(s): VITAMINB12, FOLATE, FERRITIN, TIBC, IRON, RETICCTPCT in the last 72 hours. Urine analysis:    Component Value Date/Time   COLORURINE YELLOW 07/03/2021 0300   APPEARANCEUR CLEAR 07/03/2021 0300   LABSPEC >1.030 (H) 07/03/2021 0300   PHURINE 6.0 07/03/2021 0300   GLUCOSEU NEGATIVE 07/03/2021 0300   HGBUR NEGATIVE 07/03/2021 0300   BILIRUBINUR NEGATIVE 07/03/2021 0300   KETONESUR NEGATIVE 07/03/2021 0300   PROTEINUR NEGATIVE 07/03/2021 0300   NITRITE NEGATIVE 07/03/2021 0300   LEUKOCYTESUR NEGATIVE 07/03/2021 0300    Radiological Exams on Admission: CT HEAD WO CONTRAST  Result Date: 08/05/2021 CLINICAL DATA:  Neuro deficit, acute, stroke suspected. Gait abnormality, lean to the left. Dementia. EXAM: CT HEAD WITHOUT CONTRAST TECHNIQUE: Contiguous axial images were obtained from the base of the skull through the vertex without intravenous contrast. COMPARISON:  07/03/2021 FINDINGS: Brain: Normal anatomic configuration. Parenchymal volume loss is commensurate with the patient's age. Mild periventricular white matter changes are present likely reflecting the sequela of small vessel ischemia. No abnormal intra or extra-axial mass lesion or fluid collection. No abnormal mass effect or midline shift. No evidence of acute intracranial hemorrhage or infarct. Ventricular size is normal. Cerebellum unremarkable. Vascular: No asymmetric hyperdense vasculature at the skull base. Skull: Intact Sinuses/Orbits: Paranasal sinuses are clear. Orbits are unremarkable. Other: Mastoid air cells and middle ear cavities are clear. IMPRESSION: No acute intracranial abnormality.  Mild senescent change. Electronically Signed   By: Helyn Numbers M.D.   On: 08/05/2021 20:15   CT ANGIO HEAD CODE STROKE  Result Date:  08/06/2021 CLINICAL DATA:  Initial evaluation for possible stroke, leaning to left. EXAM: CT ANGIOGRAPHY HEAD AND NECK TECHNIQUE: Multidetector CT imaging of the head and neck was performed using the standard protocol during bolus administration of intravenous contrast. Multiplanar CT image reconstructions and MIPs were obtained to evaluate the vascular anatomy. Carotid stenosis measurements (when applicable) are obtained utilizing NASCET criteria, using the distal internal carotid diameter as the denominator. CONTRAST:  OMNIPAQUE IOHEXOL 350 MG/ML SOLN COMPARISON:  Prior head CT from earlier the same day. FINDINGS: CTA NECK FINDINGS Aortic arch: Visualized aortic arch normal caliber with normal 3 vessel morphology. No hemodynamically significant stenosis seen about the origin of the great vessels. Right carotid system: Right common and internal carotid arteries widely patent without stenosis, dissection or occlusion. Left carotid system: Left common and internal carotid arteries  widely patent without stenosis, dissection or occlusion. Vertebral arteries: Both vertebral arteries arise from the subclavian arteries. No proximal subclavian artery stenosis. Both vertebral arteries widely patent without stenosis, dissection or occlusion. Skeleton: No discrete or worrisome osseous lesions. Mild-to-moderate spondylosis at C5-6 and C6-7. Other neck: No other acute soft tissue abnormality within the neck. Few small left thyroid nodules measure up to 4 mm, felt to be of doubtful significance given size and patient age, no follow-up imaging recommended (ref: J Am Coll Radiol. 2015 Feb;12(2): 143-50). Upper chest: There is question of a focal filling defect within right upper lobe segmental pulmonary artery branches (series 3, image 19), suspicious for possible acute PE. Remainder the visualized upper chest demonstrates no other acute finding. Review of the MIP images confirms the above findings CTA HEAD FINDINGS  Anterior circulation: Both internal carotid arteries widely patent to the termini without stenosis. A1 segments widely patent. Normal anterior communicating artery complex. Both anterior cerebral arteries widely patent to their distal aspects without stenosis. No M1 stenosis or occlusion. Normal MCA bifurcations. Distal MCA branches well perfused and symmetric. Posterior circulation: Both V4 segments patent to the vertebrobasilar junction without stenosis. Both PICA origins patent and normal. Basilar widely patent to its distal aspect without stenosis. Superior cerebellar arteries patent bilaterally. Both PCAs primarily supplied via the basilar and are well perfused to there distal aspects. Venous sinuses: Grossly patent allowing for timing the contrast bolus. Anatomic variants: None significant.  No aneurysm. Review of the MIP images confirms the above findings IMPRESSION: 1. Negative CTA of the head and neck. No large vessel occlusion, hemodynamically significant stenosis, or other acute vascular abnormality. 2. Question focal filling defect within right upper lobe segmental pulmonary artery branches, suspicious for possible acute PE. Correlation with dedicated chest radiograph suggested. Current attempt is being made to contact the ordering clinician. Results will be conveyed as soon as possible. Electronically Signed   By: Rise Mu M.D.   On: 08/06/2021 00:08   CT ANGIO NECK CODE STROKE  Result Date: 08/06/2021 CLINICAL DATA:  Initial evaluation for possible stroke, leaning to left. EXAM: CT ANGIOGRAPHY HEAD AND NECK TECHNIQUE: Multidetector CT imaging of the head and neck was performed using the standard protocol during bolus administration of intravenous contrast. Multiplanar CT image reconstructions and MIPs were obtained to evaluate the vascular anatomy. Carotid stenosis measurements (when applicable) are obtained utilizing NASCET criteria, using the distal internal carotid diameter as the  denominator. CONTRAST:  OMNIPAQUE IOHEXOL 350 MG/ML SOLN COMPARISON:  Prior head CT from earlier the same day. FINDINGS: CTA NECK FINDINGS Aortic arch: Visualized aortic arch normal caliber with normal 3 vessel morphology. No hemodynamically significant stenosis seen about the origin of the great vessels. Right carotid system: Right common and internal carotid arteries widely patent without stenosis, dissection or occlusion. Left carotid system: Left common and internal carotid arteries widely patent without stenosis, dissection or occlusion. Vertebral arteries: Both vertebral arteries arise from the subclavian arteries. No proximal subclavian artery stenosis. Both vertebral arteries widely patent without stenosis, dissection or occlusion. Skeleton: No discrete or worrisome osseous lesions. Mild-to-moderate spondylosis at C5-6 and C6-7. Other neck: No other acute soft tissue abnormality within the neck. Few small left thyroid nodules measure up to 4 mm, felt to be of doubtful significance given size and patient age, no follow-up imaging recommended (ref: J Am Coll Radiol. 2015 Feb;12(2): 143-50). Upper chest: There is question of a focal filling defect within right upper lobe segmental pulmonary artery branches (series 3,  image 19), suspicious for possible acute PE. Remainder the visualized upper chest demonstrates no other acute finding. Review of the MIP images confirms the above findings CTA HEAD FINDINGS Anterior circulation: Both internal carotid arteries widely patent to the termini without stenosis. A1 segments widely patent. Normal anterior communicating artery complex. Both anterior cerebral arteries widely patent to their distal aspects without stenosis. No M1 stenosis or occlusion. Normal MCA bifurcations. Distal MCA branches well perfused and symmetric. Posterior circulation: Both V4 segments patent to the vertebrobasilar junction without stenosis. Both PICA origins patent and normal. Basilar  widely patent to its distal aspect without stenosis. Superior cerebellar arteries patent bilaterally. Both PCAs primarily supplied via the basilar and are well perfused to there distal aspects. Venous sinuses: Grossly patent allowing for timing the contrast bolus. Anatomic variants: None significant.  No aneurysm. Review of the MIP images confirms the above findings IMPRESSION: 1. Negative CTA of the head and neck. No large vessel occlusion, hemodynamically significant stenosis, or other acute vascular abnormality. 2. Question focal filling defect within right upper lobe segmental pulmonary artery branches, suspicious for possible acute PE. Correlation with dedicated chest radiograph suggested. Current attempt is being made to contact the ordering clinician. Results will be conveyed as soon as possible. Electronically Signed   By: Rise Mu M.D.   On: 08/06/2021 00:08    EKG: Personally reviewed. Normal sinus rhythm without acute ischemic changes, not significantly changed when compared to prior.  Assessment/Plan Principal Problem:   Unstable gait Active Problems:   Frontotemporal dementia with behavioral disturbance (HCC)   Encephalopathy acute   Chukwuebuka Churchill is a 65 y.o. male with medical history significant for frontotemporal dementia with behavioral disturbance, hypertension who is admitted from his nursing facility with apparent change in mental status and abnormal gait for stroke work-up.  Encephalopathy/abnormal gait: Per report, patient had left forward leaning gait at his nursing facility.  Exam unable to be completed due to hypersomnolence and unarousable state since arrival.  Neurology were consulted and recommended admission for stroke work-up. -CT head without contrast negative for acute intracranial abnormality.  CTA head/neck were negative studies with incidental finding as documented below -Obtain MRI brain without contrast and echocardiogram when able -Keep n.p.o.  until mental status improves and passes swallow screen -Follow urinalysis, UDS  Possible right-sided PE: Incidental finding of focal filling defect within right upper lobe segmental pulmonary artery branches seen on CTA head/neck.  Follow-up CTA chest PE study ordered and pending.  If positive, can start on IV heparin drip.  Frontotemporal dementia with behavioral disturbance: Home meds on hold as he is hypersomnolent and unarousable.  DVT prophylaxis: Lovenox Code Status: DNR, based on prior Family Communication: None available on admission Disposition Plan: Pending clinical progress Consults called: Neurology Level of care: Telemetry Medical Admission status:   Status is: Observation  The patient remains OBS appropriate and will d/c before 2 midnights.  Darreld Mclean MD [facility]  If 7PM-7AM, please contact night-coverage [email or web address]  08/06/2021, 1:46 AM

## 2021-08-06 ENCOUNTER — Inpatient Hospital Stay (HOSPITAL_COMMUNITY)
Admit: 2021-08-06 | Discharge: 2021-08-06 | Disposition: A | Discharge status: Home / Self Care | Payer: Medicare Other | Attending: Student in an Organized Health Care Education/Training Program | Admitting: Student in an Organized Health Care Education/Training Program

## 2021-08-06 ENCOUNTER — Other Ambulatory Visit (HOSPITAL_COMMUNITY): Payer: No Typology Code available for payment source

## 2021-08-06 ENCOUNTER — Observation Stay (HOSPITAL_COMMUNITY): Payer: Medicare Other

## 2021-08-06 ENCOUNTER — Encounter (HOSPITAL_COMMUNITY): Payer: Self-pay

## 2021-08-06 ENCOUNTER — Observation Stay (HOSPITAL_BASED_OUTPATIENT_CLINIC_OR_DEPARTMENT_OTHER): Payer: Medicare Other

## 2021-08-06 DIAGNOSIS — F329 Major depressive disorder, single episode, unspecified: Secondary | ICD-10-CM | POA: Diagnosis present

## 2021-08-06 DIAGNOSIS — Z66 Do not resuscitate: Secondary | ICD-10-CM | POA: Diagnosis present

## 2021-08-06 DIAGNOSIS — R2681 Unsteadiness on feet: Secondary | ICD-10-CM | POA: Diagnosis present

## 2021-08-06 DIAGNOSIS — R531 Weakness: Secondary | ICD-10-CM | POA: Diagnosis not present

## 2021-08-06 DIAGNOSIS — Z820 Family history of epilepsy and other diseases of the nervous system: Secondary | ICD-10-CM | POA: Diagnosis not present

## 2021-08-06 DIAGNOSIS — G9340 Encephalopathy, unspecified: Secondary | ICD-10-CM | POA: Diagnosis not present

## 2021-08-06 DIAGNOSIS — R400 Somnolence: Secondary | ICD-10-CM

## 2021-08-06 DIAGNOSIS — F0283 Dementia in other diseases classified elsewhere, unspecified severity, with mood disturbance: Secondary | ICD-10-CM | POA: Diagnosis present

## 2021-08-06 DIAGNOSIS — Z751 Person awaiting admission to adequate facility elsewhere: Secondary | ICD-10-CM | POA: Diagnosis not present

## 2021-08-06 DIAGNOSIS — Z20822 Contact with and (suspected) exposure to covid-19: Secondary | ICD-10-CM | POA: Diagnosis present

## 2021-08-06 DIAGNOSIS — E876 Hypokalemia: Secondary | ICD-10-CM | POA: Diagnosis present

## 2021-08-06 DIAGNOSIS — R4182 Altered mental status, unspecified: Secondary | ICD-10-CM | POA: Diagnosis present

## 2021-08-06 DIAGNOSIS — I82431 Acute embolism and thrombosis of right popliteal vein: Secondary | ICD-10-CM | POA: Diagnosis present

## 2021-08-06 DIAGNOSIS — G3109 Other frontotemporal dementia: Secondary | ICD-10-CM | POA: Diagnosis present

## 2021-08-06 DIAGNOSIS — J101 Influenza due to other identified influenza virus with other respiratory manifestations: Secondary | ICD-10-CM | POA: Diagnosis present

## 2021-08-06 DIAGNOSIS — I10 Essential (primary) hypertension: Secondary | ICD-10-CM | POA: Diagnosis present

## 2021-08-06 DIAGNOSIS — R29715 NIHSS score 15: Secondary | ICD-10-CM | POA: Diagnosis present

## 2021-08-06 DIAGNOSIS — G4710 Hypersomnia, unspecified: Secondary | ICD-10-CM | POA: Diagnosis present

## 2021-08-06 DIAGNOSIS — I2699 Other pulmonary embolism without acute cor pulmonale: Secondary | ICD-10-CM | POA: Diagnosis present

## 2021-08-06 DIAGNOSIS — F02818 Dementia in other diseases classified elsewhere, unspecified severity, with other behavioral disturbance: Secondary | ICD-10-CM

## 2021-08-06 DIAGNOSIS — G9341 Metabolic encephalopathy: Secondary | ICD-10-CM | POA: Diagnosis present

## 2021-08-06 DIAGNOSIS — F05 Delirium due to known physiological condition: Secondary | ICD-10-CM | POA: Diagnosis present

## 2021-08-06 DIAGNOSIS — Z79899 Other long term (current) drug therapy: Secondary | ICD-10-CM | POA: Diagnosis not present

## 2021-08-06 DIAGNOSIS — E538 Deficiency of other specified B group vitamins: Secondary | ICD-10-CM | POA: Diagnosis present

## 2021-08-06 DIAGNOSIS — F0284 Dementia in other diseases classified elsewhere, unspecified severity, with anxiety: Secondary | ICD-10-CM | POA: Diagnosis present

## 2021-08-06 DIAGNOSIS — R748 Abnormal levels of other serum enzymes: Secondary | ICD-10-CM | POA: Diagnosis present

## 2021-08-06 LAB — BLOOD GAS, ARTERIAL
Acid-Base Excess: 3.2 mmol/L — ABNORMAL HIGH (ref 0.0–2.0)
Bicarbonate: 26.9 mmol/L (ref 20.0–28.0)
Drawn by: 25788
FIO2: 21
O2 Saturation: 95.2 %
Patient temperature: 98.6
pCO2 arterial: 39.1 mmHg (ref 32.0–48.0)
pH, Arterial: 7.451 — ABNORMAL HIGH (ref 7.350–7.450)
pO2, Arterial: 75 mmHg — ABNORMAL LOW (ref 83.0–108.0)

## 2021-08-06 LAB — CBC
HCT: 39.1 % (ref 39.0–52.0)
Hemoglobin: 12.8 g/dL — ABNORMAL LOW (ref 13.0–17.0)
MCH: 32.4 pg (ref 26.0–34.0)
MCHC: 32.7 g/dL (ref 30.0–36.0)
MCV: 99 fL (ref 80.0–100.0)
Platelets: 158 10*3/uL (ref 150–400)
RBC: 3.95 MIL/uL — ABNORMAL LOW (ref 4.22–5.81)
RDW: 13 % (ref 11.5–15.5)
WBC: 7.9 10*3/uL (ref 4.0–10.5)
nRBC: 0 % (ref 0.0–0.2)

## 2021-08-06 LAB — ECHOCARDIOGRAM COMPLETE BUBBLE STUDY
Area-P 1/2: 3.56 cm2
Height: 72 in
S' Lateral: 3.5 cm
Weight: 2880 oz

## 2021-08-06 LAB — URINALYSIS, ROUTINE W REFLEX MICROSCOPIC
Bacteria, UA: NONE SEEN
Bilirubin Urine: NEGATIVE
Glucose, UA: NEGATIVE mg/dL
Hgb urine dipstick: NEGATIVE
Ketones, ur: NEGATIVE mg/dL
Nitrite: NEGATIVE
Protein, ur: NEGATIVE mg/dL
Specific Gravity, Urine: 1.035 — ABNORMAL HIGH (ref 1.005–1.030)
pH: 8 (ref 5.0–8.0)

## 2021-08-06 LAB — LACTIC ACID, PLASMA: Lactic Acid, Venous: 0.9 mmol/L (ref 0.5–1.9)

## 2021-08-06 LAB — LIPID PANEL
Cholesterol: 122 mg/dL (ref 0–200)
HDL: 45 mg/dL (ref >40)
LDL Cholesterol: 70 mg/dL (ref 0–99)
Total CHOL/HDL Ratio: 2.7 RATIO
Triglycerides: 35 mg/dL (ref <150)
VLDL: 7 mg/dL (ref 0–40)

## 2021-08-06 LAB — HEPARIN LEVEL (UNFRACTIONATED): Heparin Unfractionated: 0.63 IU/mL (ref 0.30–0.70)

## 2021-08-06 LAB — RAPID URINE DRUG SCREEN, HOSP PERFORMED
Amphetamines: NOT DETECTED
Barbiturates: NOT DETECTED
Benzodiazepines: NOT DETECTED
Cocaine: NOT DETECTED
Opiates: NOT DETECTED
Tetrahydrocannabinol: NOT DETECTED

## 2021-08-06 MED ORDER — HALOPERIDOL LACTATE 5 MG/ML IJ SOLN
1.0000 mg | Freq: Four times a day (QID) | INTRAMUSCULAR | Status: AC | PRN
Start: 2021-08-06 — End: 2021-08-07
  Administered 2021-08-06 – 2021-08-07 (×2): 1 mg via INTRAMUSCULAR
  Filled 2021-08-06 (×2): qty 1

## 2021-08-06 MED ORDER — POTASSIUM CHLORIDE 10 MEQ/100ML IV SOLN
10.0000 meq | INTRAVENOUS | Status: AC
  Administered 2021-08-06 (×5): 10 meq via INTRAVENOUS
  Filled 2021-08-06 (×5): qty 100

## 2021-08-06 MED ORDER — STROKE: EARLY STAGES OF RECOVERY BOOK
Freq: Once | Status: AC
  Filled 2021-08-06: qty 1

## 2021-08-06 MED ORDER — ACETAMINOPHEN 325 MG PO TABS
650.0000 mg | ORAL_TABLET | ORAL | Status: DC | PRN
  Administered 2021-08-08 (×2): 650 mg via ORAL
  Filled 2021-08-06 (×2): qty 2

## 2021-08-06 MED ORDER — IOHEXOL 350 MG/ML SOLN
75.0000 mL | Freq: Once | INTRAVENOUS | Status: AC | PRN
  Administered 2021-08-06: 75 mL via INTRAVENOUS

## 2021-08-06 MED ORDER — ACETAMINOPHEN 650 MG RE SUPP
650.0000 mg | RECTAL | Status: DC | PRN
  Filled 2021-08-06: qty 1

## 2021-08-06 MED ORDER — HEPARIN (PORCINE) 25000 UT/250ML-% IV SOLN
1350.0000 [IU]/h | INTRAVENOUS | Status: DC
  Administered 2021-08-06: 1450 [IU]/h via INTRAVENOUS
  Filled 2021-08-06 (×2): qty 250

## 2021-08-06 MED ORDER — HEPARIN BOLUS VIA INFUSION
2500.0000 [IU] | Freq: Once | INTRAVENOUS | Status: AC
  Administered 2021-08-06: 2500 [IU] via INTRAVENOUS
  Filled 2021-08-06: qty 2500

## 2021-08-06 MED ORDER — SENNOSIDES-DOCUSATE SODIUM 8.6-50 MG PO TABS: 1.0000 | ORAL_TABLET | Freq: Every evening | ORAL | Status: DC | PRN

## 2021-08-06 MED ORDER — ACETAMINOPHEN 160 MG/5ML PO SOLN: 650.0000 mg | ORAL | Status: DC | PRN

## 2021-08-06 MED ORDER — HALOPERIDOL 1 MG PO TABS: 1.0000 mg | ORAL_TABLET | Freq: Four times a day (QID) | ORAL | Status: AC | PRN

## 2021-08-06 MED ORDER — ENOXAPARIN SODIUM 40 MG/0.4ML IJ SOSY: 40.0000 mg | PREFILLED_SYRINGE | INTRAMUSCULAR | Status: DC

## 2021-08-06 MED ORDER — SODIUM CHLORIDE 0.9 % IV SOLN
100.0000 mL/h | INTRAVENOUS | Status: AC
  Administered 2021-08-06: 100 mL/h via INTRAVENOUS

## 2021-08-06 NOTE — Progress Notes (Signed)
ANTICOAGULATION CONSULT NOTE  Pharmacy Consult for Heparin Indication: pulmonary embolus  No Known Allergies  Patient Measurements: Height: 6' (182.9 cm) Weight: 81.6 kg (180 lb) IBW/kg (Calculated) : 77.6 Ht: 6 feet Heparin Dosing Weight: 86 kg actual weight  Vital Signs: Temp: 97.5 F (36.4 C) (10/31 0900) Temp Source: Axillary (10/31 0900) BP: 121/74 (10/31 1500) Pulse Rate: 84 (10/31 1500)  Labs: Recent Labs    08/05/21 1924 08/05/21 1929 08/06/21 1633  HGB 12.7* 12.6*  --   HCT 38.0* 37.0*  --   PLT 148*  --   --   APTT 31  --   --   LABPROT 13.5  --   --   INR 1.0  --   --   HEPARINUNFRC  --   --  0.63  CREATININE 0.62 0.70  --      Estimated Creatinine Clearance: 101 mL/min (by C-G formula based on SCr of 0.7 mg/dL).   Medical History: Past Medical History:  Diagnosis Date   Hypertension    Assessment: 65 y/o M with a h/o dementia admitted from nursing home for abnormal gait.CTA revealed PE. Pharmacy consulted for heparin infusion.   Today, 08/06/2021: First heparin level therapeutic on 1450 units/hr Hgb and Plt both slightly low SCr WNL No bleeding or infusion issues per RN  Goal of Therapy:  Heparin level 0.3-0.7 units/ml Monitor platelets by anticoagulation protocol: Yes   Plan:  Continue heparin infusion at 1450 units/hr Check confirmatory heparin level in 6-8 hr Daily CBC and heparin level Continue to monitor H&H and platelets F/u plans for long-term anticoagulation   Levent Kornegay A 08/06/2021,6:00 PM

## 2021-08-06 NOTE — Progress Notes (Signed)
OT Cancellation Note  Patient Details Name: Micheal [name] MRN: [id number] DOB: 26-Oct-1955   Cancelled Treatment:    Reason Eval/Treat Not Completed: Medical issues which prohibited therapy patient is currently under further testing for PE with pending initiation of Heparin. OT to hold until patient is medically stable and heparin levels are within therapeutic range.   Sharyn Blitz OTR/L, MS Acute Rehabilitation Department Office# [phone] Pager# [phone]    08/06/2021, 7:47 AM

## 2021-08-06 NOTE — ED Notes (Signed)
Re turned from MRI. Heparin still not showing as due or available to pull from pyxis. Pharmacy contacted to get heparin.

## 2021-08-06 NOTE — Progress Notes (Signed)
Bilateral lower extremity venous duplex has been completed. Preliminary results can be found in CV Proc through chart review.  Results were given to the patient's RN.  08/06/21 4:20 PM Olen Cordial RVT

## 2021-08-06 NOTE — Progress Notes (Addendum)
PROGRESS NOTE  Neils Siracusa    DOB: 1955-12-01, 65 y.o.  ZOX:[id number]  PCP: Kirby Funk, MD   Code Status: DNR   DOA: 08/05/2021   LOS: 0  Brief Narrative of Current Hospitalization  Isaias Dowson is a 65 y.o. male with a PMH significant for frontotemporal dementia with behavioral disturbance, HTN. They presented from nursing home to the ED on 08/05/2021 with change in baseline mental status x 1 days. In the ED, it was found that they had encephalopathy. They were treated with heparin gtt and IV fluids.  Neurology was consulted. Patient was admitted to medicine service for further workup and management of AMS as outlined in detail below.  08/06/21 -stable  Assessment & Plan  Principal Problem:   Unstable gait Active Problems:   Frontotemporal dementia with behavioral disturbance (HCC)   Encephalopathy acute  Acute metabolic encephalopathy- patient aroused to physical stimuli briefly and fell back asleep. Head CT negative. Brain MRI poorly visualized due to movement but not read as acute changes. Echo with bubble study was negative. Urinalysis, skin exam, and pulmonary imaging negative for infection. Vital signs stable ORA other than low-normal temperature. - neurology following, appreciate recs - ABG - repeat CMP - add LA - review home medications (has klonopin, haldol, seroquel, risperdal, zoloft, trazodone on home med list). Holding for now and monitor for withdrawal effects. - SLP, PT/OT evaluation - delirium precautions  Acute pulmonary embolism- no heart strain on echo. No acute respiratory distress observed. CHA2DS2-VASc Score =  3  CTA showing right upper lobe pulmonary emboli described as small clot burden - lower extremity dopplers - patient on heparin gtt- can transition to eliquis.   Mild Hypokalemia- K+ 3.3. unlikely to have electrolytes contributing to current mental state.  - replete - BMP repeat  Frontotemporal dementia- episodes of agitation and  sedation - holding sedative medicines as able  HTN- not on home medications. Normotensive during admission - continue to monitor  DVT prophylaxis: heparin bolus via infusion 2,500 Units Start: 08/06/21 0430   Diet:  Diet Orders (From admission, onward)     Start     Ordered   08/05/21 1908  Diet NPO time specified  Diet effective now       Comments: NPO until stroke swallow screen is complete   08/05/21 1908            Subjective 08/06/21    Pt unable to provide history  Disposition Plan & Communication  Patient status: Observation  Admitted From: SNF Disposition: Skilled nursing facility Anticipated discharge date: TBD  Family Communication: none  Consults, Procedures, Significant Events  Consultants:  neurology  Procedures/significant events:  Brain MRI echo Antimicrobials:  Anti-infectives (From admission, onward)    None       Objective   Vitals:   08/06/21 0200 08/06/21 0400 08/06/21 0500 08/06/21 0526  BP: (!) 149/87 (!) 124/100 132/81   Pulse: 73 69 78   Resp:  17 17   Temp:   (!) 97.2 F (36.2 C)   TempSrc:      SpO2: 97% 93% 95%   Weight:    81.6 kg  Height:    6' (1.829 m)   No intake or output data in the 24 hours ending 08/06/21 0748 Filed Weights   08/06/21 0526  Weight: 81.6 kg    Patient BMI: Body mass index is 24.41 kg/m.   Physical Exam: General: asleep, NAD HEENT: atraumatic, clear conjunctiva, anicteric sclera, moist mucus membranes Respiratory: normal respiratory  effort. Clear throughout lung fields Cardiovascular: normal S1/S2,  RRR, no JVD, murmurs, rubs, gallops, quick capillary refill  Gastrointestinal: soft, ND Nervous: asleep. Responds to physical stimuli but not able to participate in exam. PERRL Extremities:no edema, normal tone Skin: dry, intact, normal temperature, normal color, No rashes, lesions or ulcers  Labs   I have personally reviewed following labs and imaging studies Admission on 08/05/2021   Component Date Value Ref Range Status   SARS Coronavirus 2 by RT PCR 08/05/2021 NEGATIVE  NEGATIVE Final   Influenza A by PCR 08/05/2021 NEGATIVE  NEGATIVE Final   Influenza B by PCR 08/05/2021 NEGATIVE  NEGATIVE Final   Sodium 08/05/2021 140  135 - 145 mmol/L Final   Potassium 08/05/2021 3.3 (A)  3.5 - 5.1 mmol/L Final   Chloride 08/05/2021 98  98 - 111 mmol/L Final   BUN 08/05/2021 16  8 - 23 mg/dL Final   Creatinine, Ser 08/05/2021 0.70  0.61 - 1.24 mg/dL Final   Glucose, Bld 09/81/1914 131 (A)  70 - 99 mg/dL Final   Calcium, Ion 78/29/5621 1.17  1.15 - 1.40 mmol/L Final   TCO2 08/05/2021 31  22 - 32 mmol/L Final   Hemoglobin 08/05/2021 12.6 (A)  13.0 - 17.0 g/dL Final   HCT 30/86/5784 37.0 (A)  39.0 - 52.0 % Final   Prothrombin Time 08/05/2021 13.5  11.4 - 15.2 seconds Final   INR 08/05/2021 1.0  0.8 - 1.2 Final   aPTT 08/05/2021 31  24 - 36 seconds Final   WBC 08/05/2021 7.4  4.0 - 10.5 K/uL Final   RBC 08/05/2021 3.87 (A)  4.22 - 5.81 MIL/uL Final   Hemoglobin 08/05/2021 12.7 (A)  13.0 - 17.0 g/dL Final   HCT 69/62/9528 38.0 (A)  39.0 - 52.0 % Final   MCV 08/05/2021 98.2  80.0 - 100.0 fL Final   MCH 08/05/2021 32.8  26.0 - 34.0 pg Final   MCHC 08/05/2021 33.4  30.0 - 36.0 g/dL Final   RDW 41/32/4401 13.2  11.5 - 15.5 % Final   Platelets 08/05/2021 148 (A)  150 - 400 K/uL Final   nRBC 08/05/2021 0.0  0.0 - 0.2 % Final   Neutrophils Relative % 08/05/2021 74  % Final   Neutro Abs 08/05/2021 5.4  1.7 - 7.7 K/uL Final   Lymphocytes Relative 08/05/2021 16  % Final   Lymphs Abs 08/05/2021 1.2  0.7 - 4.0 K/uL Final   Monocytes Relative 08/05/2021 10  % Final   Monocytes Absolute 08/05/2021 0.7  0.1 - 1.0 K/uL Final   Eosinophils Relative 08/05/2021 0  % Final   Eosinophils Absolute 08/05/2021 0.0  0.0 - 0.5 K/uL Final   Basophils Relative 08/05/2021 0  % Final   Basophils Absolute 08/05/2021 0.0  0.0 - 0.1 K/uL Final   Immature Granulocytes 08/05/2021 0  % Final   Abs Immature  Granulocytes 08/05/2021 0.02  0.00 - 0.07 K/uL Final   Sodium 08/05/2021 140  135 - 145 mmol/L Final   Potassium 08/05/2021 3.3 (A)  3.5 - 5.1 mmol/L Final   Chloride 08/05/2021 102  98 - 111 mmol/L Final   CO2 08/05/2021 30  22 - 32 mmol/L Final   Glucose, Bld 08/05/2021 135 (A)  70 - 99 mg/dL Final   BUN 02/72/5366 18  8 - 23 mg/dL Final   Creatinine, Ser 08/05/2021 0.62  0.61 - 1.24 mg/dL Final   Calcium 44/12/4740 8.6 (A)  8.9 - 10.3 mg/dL Final   Total  Protein 08/05/2021 6.1 (A)  6.5 - 8.1 g/dL Final   Albumin 25/63/8937 3.6  3.5 - 5.0 g/dL Final   AST 34/28/7681 27  15 - 41 U/L Final   ALT 08/05/2021 15  0 - 44 U/L Final   Alkaline Phosphatase 08/05/2021 63  38 - 126 U/L Final   Total Bilirubin 08/05/2021 0.6  0.3 - 1.2 mg/dL Final   GFR, Estimated 08/05/2021 >60  >60 mL/min Final   Anion gap 08/05/2021 8  5 - 15 Final   Opiates 08/06/2021 NONE DETECTED  NONE DETECTED Final   Cocaine 08/06/2021 NONE DETECTED  NONE DETECTED Final   Benzodiazepines 08/06/2021 NONE DETECTED  NONE DETECTED Final   Amphetamines 08/06/2021 NONE DETECTED  NONE DETECTED Final   Tetrahydrocannabinol 08/06/2021 NONE DETECTED  NONE DETECTED Final   Barbiturates 08/06/2021 NONE DETECTED  NONE DETECTED Final   Color, Urine 08/06/2021 YELLOW  YELLOW Final   APPearance 08/06/2021 CLEAR  CLEAR Final   Specific Gravity, Urine 08/06/2021 1.035 (A)  1.005 - 1.030 Final   pH 08/06/2021 8.0  5.0 - 8.0 Final   Glucose, UA 08/06/2021 NEGATIVE  NEGATIVE mg/dL Final   Hgb urine dipstick 08/06/2021 NEGATIVE  NEGATIVE Final   Bilirubin Urine 08/06/2021 NEGATIVE  NEGATIVE Final   Ketones, ur 08/06/2021 NEGATIVE  NEGATIVE mg/dL Final   Protein, ur 15/72/6203 NEGATIVE  NEGATIVE mg/dL Final   Nitrite 55/97/4163 NEGATIVE  NEGATIVE Final   Leukocytes,Ua 08/06/2021 TRACE (A)  NEGATIVE Final   RBC / HPF 08/06/2021 6-10  0 - 5 RBC/hpf Final   WBC, UA 08/06/2021 11-20  0 - 5 WBC/hpf Final   Bacteria, UA 08/06/2021 NONE SEEN   NONE SEEN Final   Glucose-Capillary 08/05/2021 132 (A)  70 - 99 mg/dL Final    Imaging Studies  CT HEAD WO CONTRAST  Result Date: 08/05/2021 CLINICAL DATA:  Neuro deficit, acute, stroke suspected. Gait abnormality, lean to the left. Dementia. EXAM: CT HEAD WITHOUT CONTRAST TECHNIQUE: Contiguous axial images were obtained from the base of the skull through the vertex without intravenous contrast. COMPARISON:  07/03/2021 FINDINGS: Brain: Normal anatomic configuration. Parenchymal volume loss is commensurate with the patient's age. Mild periventricular white matter changes are present likely reflecting the sequela of small vessel ischemia. No abnormal intra or extra-axial mass lesion or fluid collection. No abnormal mass effect or midline shift. No evidence of acute intracranial hemorrhage or infarct. Ventricular size is normal. Cerebellum unremarkable. Vascular: No asymmetric hyperdense vasculature at the skull base. Skull: Intact Sinuses/Orbits: Paranasal sinuses are clear. Orbits are unremarkable. Other: Mastoid air cells and middle ear cavities are clear. IMPRESSION: No acute intracranial abnormality.  Mild senescent change. Electronically Signed   By: Helyn Numbers M.D.   On: 08/05/2021 20:15   CT Angio Chest PE W and/or Wo Contrast  Addendum Date: 08/06/2021   ADDENDUM REPORT: 08/06/2021 03:20 ADDENDUM: Critical Value/emergent results were called by telephone at the time of interpretation on 08/06/2021 at 3:18 am to provider Dr Rachael Darby, who verbally acknowledged these results. Electronically Signed   By: Charline Bills M.D.   On: 08/06/2021 03:20   Result Date: 08/06/2021 CLINICAL DATA:  Suspected PE on CTA neck. EXAM: CT ANGIOGRAPHY CHEST WITH CONTRAST TECHNIQUE: Multidetector CT imaging of the chest was performed using the standard protocol during bolus administration of intravenous contrast. Multiplanar CT image reconstructions and MIPs were obtained to evaluate the vascular anatomy.  CONTRAST:  6mL OMNIPAQUE IOHEXOL 350 MG/ML SOLN COMPARISON:  None. FINDINGS: Cardiovascular: Satisfactory opacification the bilateral  pulmonary arteries to the segmental level. Evaluation is constrained by respiratory motion, particularly in the bilateral lower lobes. Within that constraint, there are segmental pulmonary emboli within two branches of the right upper lobe pulmonary artery (series 5/image 88), corresponding to the CTA neck finding. Overall clot burden is small. No evidence of right heart strain. Although not tailored for evaluation of the thoracic aorta, there is no evidence thoracic aortic aneurysm or dissection. The heart is normal in size.  No pericardial effusion. Three vessel coronary sclerosis. Mediastinum/Nodes: No suspicious mediastinal lymphadenopathy. Visualized thyroid is unremarkable. Lungs/Pleura: Mild paraseptal emphysematous changes, upper lung predominant. Mild dependent atelectasis in the bilateral lower lobes. No focal consolidation. Evaluation lung parenchyma is constrained by respiratory motion. Within that constraint, there are no suspicious pulmonary nodules. No pleural effusion or pneumothorax. Upper Abdomen: Visualized upper abdomen is notable for a 3.6 cm posterior left upper pole renal cysts. Musculoskeletal: Degenerative changes of the visualized thoracolumbar spine. Review of the MIP images confirms the above findings. IMPRESSION: Segmental right upper lobe pulmonary emboli, corresponding to the CTA neck finding. Overall clot burden is small. No evidence of right heart strain. Emphysema (ICD10-J43.9). Electronically Signed: By: Charline Bills M.D. On: 08/06/2021 03:08   CT ANGIO HEAD CODE STROKE  Addendum Date: 08/06/2021   ADDENDUM REPORT: 08/06/2021 05:14 ADDENDUM: There is a dictation error in the impression portion of the report. Regarding the questioned focal filling defect within the pulmonary artery branches, correlation with dedicated PE protocol CTA of the  chest would be recommended for further evaluation, not chest radiograph. Critical Value/emergent results were called by telephone at the time of interpretation on 08/06/2021 at 12:15 am to provider Dr. Drema Pry, who verbally acknowledged these results. Electronically Signed   By: Rise Mu M.D.   On: 08/06/2021 05:14   Result Date: 08/06/2021 CLINICAL DATA:  Initial evaluation for possible stroke, leaning to left. EXAM: CT ANGIOGRAPHY HEAD AND NECK TECHNIQUE: Multidetector CT imaging of the head and neck was performed using the standard protocol during bolus administration of intravenous contrast. Multiplanar CT image reconstructions and MIPs were obtained to evaluate the vascular anatomy. Carotid stenosis measurements (when applicable) are obtained utilizing NASCET criteria, using the distal internal carotid diameter as the denominator. CONTRAST:  OMNIPAQUE IOHEXOL 350 MG/ML SOLN COMPARISON:  Prior head CT from earlier the same day. FINDINGS: CTA NECK FINDINGS Aortic arch: Visualized aortic arch normal caliber with normal 3 vessel morphology. No hemodynamically significant stenosis seen about the origin of the great vessels. Right carotid system: Right common and internal carotid arteries widely patent without stenosis, dissection or occlusion. Left carotid system: Left common and internal carotid arteries widely patent without stenosis, dissection or occlusion. Vertebral arteries: Both vertebral arteries arise from the subclavian arteries. No proximal subclavian artery stenosis. Both vertebral arteries widely patent without stenosis, dissection or occlusion. Skeleton: No discrete or worrisome osseous lesions. Mild-to-moderate spondylosis at C5-6 and C6-7. Other neck: No other acute soft tissue abnormality within the neck. Few small left thyroid nodules measure up to 4 mm, felt to be of doubtful significance given size and patient age, no follow-up imaging recommended (ref: J Am Coll  Radiol. 2015 Feb;12(2): 143-50). Upper chest: There is question of a focal filling defect within right upper lobe segmental pulmonary artery branches (series 3, image 19), suspicious for possible acute PE. Remainder the visualized upper chest demonstrates no other acute finding. Review of the MIP images confirms the above findings CTA HEAD FINDINGS Anterior circulation: Both internal carotid arteries  widely patent to the termini without stenosis. A1 segments widely patent. Normal anterior communicating artery complex. Both anterior cerebral arteries widely patent to their distal aspects without stenosis. No M1 stenosis or occlusion. Normal MCA bifurcations. Distal MCA branches well perfused and symmetric. Posterior circulation: Both V4 segments patent to the vertebrobasilar junction without stenosis. Both PICA origins patent and normal. Basilar widely patent to its distal aspect without stenosis. Superior cerebellar arteries patent bilaterally. Both PCAs primarily supplied via the basilar and are well perfused to there distal aspects. Venous sinuses: Grossly patent allowing for timing the contrast bolus. Anatomic variants: None significant.  No aneurysm. Review of the MIP images confirms the above findings IMPRESSION: 1. Negative CTA of the head and neck. No large vessel occlusion, hemodynamically significant stenosis, or other acute vascular abnormality. 2. Question focal filling defect within right upper lobe segmental pulmonary artery branches, suspicious for possible acute PE. Correlation with dedicated chest radiograph suggested. Current attempt is being made to contact the ordering clinician. Results will be conveyed as soon as possible. Electronically Signed: By: Rise Mu M.D. On: 08/06/2021 00:08   CT ANGIO NECK CODE STROKE  Addendum Date: 08/06/2021   ADDENDUM REPORT: 08/06/2021 05:14 ADDENDUM: There is a dictation error in the impression portion of the report. Regarding the questioned  focal filling defect within the pulmonary artery branches, correlation with dedicated PE protocol CTA of the chest would be recommended for further evaluation, not chest radiograph. Critical Value/emergent results were called by telephone at the time of interpretation on 08/06/2021 at 12:15 am to provider Dr. Drema Pry, who verbally acknowledged these results. Electronically Signed   By: Rise Mu M.D.   On: 08/06/2021 05:14   Result Date: 08/06/2021 CLINICAL DATA:  Initial evaluation for possible stroke, leaning to left. EXAM: CT ANGIOGRAPHY HEAD AND NECK TECHNIQUE: Multidetector CT imaging of the head and neck was performed using the standard protocol during bolus administration of intravenous contrast. Multiplanar CT image reconstructions and MIPs were obtained to evaluate the vascular anatomy. Carotid stenosis measurements (when applicable) are obtained utilizing NASCET criteria, using the distal internal carotid diameter as the denominator. CONTRAST:  OMNIPAQUE IOHEXOL 350 MG/ML SOLN COMPARISON:  Prior head CT from earlier the same day. FINDINGS: CTA NECK FINDINGS Aortic arch: Visualized aortic arch normal caliber with normal 3 vessel morphology. No hemodynamically significant stenosis seen about the origin of the great vessels. Right carotid system: Right common and internal carotid arteries widely patent without stenosis, dissection or occlusion. Left carotid system: Left common and internal carotid arteries widely patent without stenosis, dissection or occlusion. Vertebral arteries: Both vertebral arteries arise from the subclavian arteries. No proximal subclavian artery stenosis. Both vertebral arteries widely patent without stenosis, dissection or occlusion. Skeleton: No discrete or worrisome osseous lesions. Mild-to-moderate spondylosis at C5-6 and C6-7. Other neck: No other acute soft tissue abnormality within the neck. Few small left thyroid nodules measure up to 4 mm, felt to be  of doubtful significance given size and patient age, no follow-up imaging recommended (ref: J Am Coll Radiol. 2015 Feb;12(2): 143-50). Upper chest: There is question of a focal filling defect within right upper lobe segmental pulmonary artery branches (series 3, image 19), suspicious for possible acute PE. Remainder the visualized upper chest demonstrates no other acute finding. Review of the MIP images confirms the above findings CTA HEAD FINDINGS Anterior circulation: Both internal carotid arteries widely patent to the termini without stenosis. A1 segments widely patent. Normal anterior communicating artery complex. Both anterior cerebral  arteries widely patent to their distal aspects without stenosis. No M1 stenosis or occlusion. Normal MCA bifurcations. Distal MCA branches well perfused and symmetric. Posterior circulation: Both V4 segments patent to the vertebrobasilar junction without stenosis. Both PICA origins patent and normal. Basilar widely patent to its distal aspect without stenosis. Superior cerebellar arteries patent bilaterally. Both PCAs primarily supplied via the basilar and are well perfused to there distal aspects. Venous sinuses: Grossly patent allowing for timing the contrast bolus. Anatomic variants: None significant.  No aneurysm. Review of the MIP images confirms the above findings IMPRESSION: 1. Negative CTA of the head and neck. No large vessel occlusion, hemodynamically significant stenosis, or other acute vascular abnormality. 2. Question focal filling defect within right upper lobe segmental pulmonary artery branches, suspicious for possible acute PE. Correlation with dedicated chest radiograph suggested. Current attempt is being made to contact the ordering clinician. Results will be conveyed as soon as possible. Electronically Signed: By: Rise Mu M.D. On: 08/06/2021 00:08   Medications   Scheduled Meds:  heparin  2,500 Units Intravenous Once   No recently  discontinued medications to reconcile  LOS: 0 days   Time spent: >59min  Leeroy Bock, DO [facility] 08/06/2021, 7:48 AM   Please refer to amion to contact the [facility] Attending or Consulting provider for this pt  [email or web address] Available by Epic secure chat 7AM-7PM. If 7PM-7AM, please contact night-coverage

## 2021-08-06 NOTE — Progress Notes (Signed)
*  PRELIMINARY RESULTS* Echocardiogram 2D Echocardiogram has been performed.  Pieter Partridge 08/06/2021, 12:23 PM

## 2021-08-06 NOTE — ED Notes (Signed)
Pulled off mittens and monitoring wires. Pulled off external catheter. Voided in floor.  Patient cleansed, changed and dried. Warm blankets given. Bed alarm on and active. Side rails up. Call bed in reach.

## 2021-08-06 NOTE — ED Notes (Signed)
Remains in MRI 

## 2021-08-06 NOTE — ED Notes (Signed)
Took over patient care, pt is restless, trying to get out of bed, trying to pull out his lines, attempting to get him to stop at this time

## 2021-08-06 NOTE — Progress Notes (Signed)
ANTICOAGULATION CONSULT NOTE - Initial Consult  Pharmacy Consult for Heparin Indication: pulmonary embolus  No Known Allergies  Patient Measurements:   Ht: 6 feet Heparin Dosing Weight: 86 kg actual weight  Vital Signs: Temp: 97.8 F (36.6 C) (10/30 1910) Temp Source: Oral (10/30 1910) BP: 149/87 (10/31 0200) Pulse Rate: 73 (10/31 0200)  Labs: Recent Labs    08/05/21 1924 08/05/21 1929  HGB 12.7* 12.6*  HCT 38.0* 37.0*  PLT 148*  --   APTT 31  --   LABPROT 13.5  --   INR 1.0  --   CREATININE 0.62 0.70    CrCl cannot be calculated (Unknown ideal weight.).   Medical History: Past Medical History:  Diagnosis Date   Hypertension    Assessment: 65 y/o M with a h/o dementia admitted from nursing home for abnormal gait.CTA revealed PE. Pharmacy consulted for heparin infusion.   Goal of Therapy:  Heparin level 0.3-0.7 units/ml Monitor platelets by anticoagulation protocol: Yes   Plan:  Give 2500 units bolus x 1 Start heparin infusion at 1450 units/hr Check anti-Xa level in 6 hours and daily while on heparin Continue to monitor H&H and platelets  Luisa Hart D 08/06/2021,3:26 AM

## 2021-08-06 NOTE — ED Notes (Signed)
To MRI via stretcher  

## 2021-08-06 NOTE — ED Notes (Signed)
Patient attempting to remove mittens and monitoring equipment and attempting to sit up and get out of bed. Patient is not able to be redirected. This Clinical research associate remained with patient until he appeared more relaxed and less agitated; bed alarm is in place and turned on, RN aware of patient behaviors.

## 2021-08-06 NOTE — Progress Notes (Signed)
SLP Cancellation Note  Patient Details Name: Micheal [name] MRN: [id number] DOB: May 25, 1956   Cancelled treatment:       Reason Eval/Treat Not Completed: Other (comment) (SLP secure chatted with patient's RN this morning and was informed that he is not following any commands. SLP to attempt f/u with patient this afternoon or next date. Thank you for this consult!)  Angela Nevin, MA, CCC-SLP Speech Therapy

## 2021-08-06 NOTE — ED Notes (Signed)
Changed patients bed, and patient, started a 20 gauge iv in the rt forearm drew labs

## 2021-08-07 ENCOUNTER — Other Ambulatory Visit: Payer: Self-pay

## 2021-08-07 DIAGNOSIS — G3109 Other frontotemporal dementia: Secondary | ICD-10-CM | POA: Diagnosis not present

## 2021-08-07 DIAGNOSIS — R2681 Unsteadiness on feet: Secondary | ICD-10-CM | POA: Diagnosis not present

## 2021-08-07 DIAGNOSIS — G9340 Encephalopathy, unspecified: Secondary | ICD-10-CM | POA: Diagnosis not present

## 2021-08-07 DIAGNOSIS — E876 Hypokalemia: Secondary | ICD-10-CM | POA: Diagnosis not present

## 2021-08-07 LAB — BASIC METABOLIC PANEL
Anion gap: 8 (ref 5–15)
BUN: 10 mg/dL (ref 8–23)
CO2: 26 mmol/L (ref 22–32)
Calcium: 8.3 mg/dL — ABNORMAL LOW (ref 8.9–10.3)
Chloride: 106 mmol/L (ref 98–111)
Creatinine, Ser: 0.63 mg/dL (ref 0.61–1.24)
GFR, Estimated: >60 mL/min (ref >60)
Glucose, Bld: 81 mg/dL (ref 70–99)
Potassium: 3.6 mmol/L (ref 3.5–5.1)
Sodium: 140 mmol/L (ref 135–145)

## 2021-08-07 LAB — HIV ANTIBODY (ROUTINE TESTING W REFLEX): HIV Screen 4th Generation wRfx: NONREACTIVE

## 2021-08-07 LAB — HEMOGLOBIN A1C
Hgb A1c MFr Bld: 5.6 % (ref 4.8–5.6)
Mean Plasma Glucose: 114.02 mg/dL

## 2021-08-07 LAB — CBC
HCT: 36.5 % — ABNORMAL LOW (ref 39.0–52.0)
Hemoglobin: 12.2 g/dL — ABNORMAL LOW (ref 13.0–17.0)
MCH: 32.4 pg (ref 26.0–34.0)
MCHC: 33.4 g/dL (ref 30.0–36.0)
MCV: 97.1 fL (ref 80.0–100.0)
Platelets: 148 10*3/uL — ABNORMAL LOW (ref 150–400)
RBC: 3.76 MIL/uL — ABNORMAL LOW (ref 4.22–5.81)
RDW: 12.9 % (ref 11.5–15.5)
WBC: 7.4 10*3/uL (ref 4.0–10.5)
nRBC: 0 % (ref 0.0–0.2)

## 2021-08-07 LAB — HEPARIN LEVEL (UNFRACTIONATED)
Heparin Unfractionated: 0.72 IU/mL — ABNORMAL HIGH (ref 0.30–0.70)
Heparin Unfractionated: 0.53 IU/mL (ref 0.30–0.70)

## 2021-08-07 MED ORDER — ORAL CARE MOUTH RINSE
15.0000 mL | Freq: Two times a day (BID) | OROMUCOSAL | Status: DC
  Administered 2021-08-08 – 2021-08-14 (×10): 15 mL via OROMUCOSAL

## 2021-08-07 MED ORDER — METOPROLOL TARTRATE 5 MG/5ML IV SOLN
2.5000 mg | Freq: Two times a day (BID) | INTRAVENOUS | Status: DC
  Administered 2021-08-07 – 2021-08-11 (×10): 2.5 mg via INTRAVENOUS
  Filled 2021-08-07 (×10): qty 5

## 2021-08-07 MED ORDER — HALOPERIDOL LACTATE 5 MG/ML IJ SOLN: 1.0000 mg | Freq: Four times a day (QID) | INTRAMUSCULAR | Status: DC | PRN

## 2021-08-07 MED ORDER — APIXABAN 5 MG PO TABS: 10.0000 mg | ORAL_TABLET | Freq: Two times a day (BID) | ORAL | Status: DC

## 2021-08-07 MED ORDER — HEPARIN (PORCINE) 25000 UT/250ML-% IV SOLN
1350.0000 [IU]/h | INTRAVENOUS | Status: DC
  Filled 2021-08-07: qty 250

## 2021-08-07 MED ORDER — APIXABAN 5 MG PO TABS: 5.0000 mg | ORAL_TABLET | Freq: Two times a day (BID) | ORAL | Status: DC

## 2021-08-07 MED ORDER — LORAZEPAM 2 MG/ML IJ SOLN: 0.5000 mg | Freq: Four times a day (QID) | INTRAMUSCULAR | Status: DC | PRN

## 2021-08-07 MED ORDER — APIXABAN 5 MG PO TABS
10.0000 mg | ORAL_TABLET | Freq: Two times a day (BID) | ORAL | Status: DC
  Administered 2021-08-07 – 2021-08-09 (×6): 10 mg via ORAL
  Filled 2021-08-07 (×7): qty 2

## 2021-08-07 NOTE — Progress Notes (Signed)
ANTICOAGULATION CONSULT NOTE  Pharmacy Consult for Heparin Indication: pulmonary embolus  No Known Allergies  Patient Measurements: Height: 6' (182.9 cm) Weight: 81.6 kg (180 lb) IBW/kg (Calculated) : 77.6 Ht: 6 feet Heparin Dosing Weight: 86 kg actual weight  Vital Signs: BP: 164/89 (11/01 0300) Pulse Rate: 81 (11/01 0300)  Labs: Recent Labs    08/05/21 1924 08/05/21 1929 08/06/21 1633 08/07/21 0244  HGB 12.7* 12.6* 12.8*  --   HCT 38.0* 37.0* 39.1  --   PLT 148*  --  158  --   APTT 31  --   --   --   LABPROT 13.5  --   --   --   INR 1.0  --   --   --   HEPARINUNFRC  --   --  0.63 0.72*  CREATININE 0.62 0.70  --   --      Estimated Creatinine Clearance: 101 mL/min (by C-G formula based on SCr of 0.7 mg/dL).   Medical History: Past Medical History:  Diagnosis Date   Hypertension    Assessment: 65 y/o M with a h/o dementia admitted from nursing home for abnormal gait.CTA revealed PE. Pharmacy consulted for heparin infusion.   Today, 08/07/2021: Heparin level 0.72- now slightly above goal range  on 1450 units/hr Hgb and Plt both slightly low SCr WNL No bleeding or infusion issues per RN  Goal of Therapy:  Heparin level 0.3-0.7 units/ml Monitor platelets by anticoagulation protocol: Yes   Plan:  Decrease heparin infusion to 1350 units/hr Check confirmatory heparin level in 6-8 hr Daily CBC and heparin level F/u plans for long-term anticoagulation   Junita Push  PharmD, BCPS 08/07/2021,3:48 AM

## 2021-08-07 NOTE — Progress Notes (Signed)
08/07/2021  1228  Patient originally arrived to the floor around 0730. Pt was in the red MEWS per Diplomatic Services operational officer. Per Charge RN I should be waiting for a secure chat to accept/deny patient. No secure chat was sent. Pt get to floor asked RN with patient reason for Red MEWs and she states he was not still when BP was taken. I aslo informed her that I never received a secure chat or report, but we could not accept a patient in the red MEWS. So she transported the patient back to the ED. At 0830 I received a secure chat, I responded that I had questions regarding restraints and Heparin Drip as I'm trying to review chart. The RN responded that it was on the SBAR, but nothing on the SBAR stated reason/Dx of why this patient was on Heparin drip or restraints. Finally the RN did give a brief explanation and I did find the MD note to get more information.

## 2021-08-07 NOTE — Plan of Care (Signed)
  Problem: Safety: Goal: Non-violent Restraint(s) Outcome: Progressing   Problem: Nutrition: Goal: Adequate nutrition will be maintained Outcome: Progressing   Problem: Elimination: Goal: Will not experience complications related to bowel motility Outcome: Progressing

## 2021-08-07 NOTE — Consult Note (Signed)
[name] New Psychiatric Evaluation   Service Date: August 07, 2021 LOS:  LOS: 1 day    Assessment  [name] is a 65 y.o. male admitted medically for 08/05/2021  6:53 PM for change in baseline mental status. He carries the psychiatric diagnoses of frontotemporal dementia and also anxiety, insomnia, mood disorder, MDD (unclear what predates dementia diagnosis)  and has a past medical history of  essential hypertension.Psychiatry was consulted for polypharmacy and management of agitation by Jamelle Rushing, MD.    His current presentation of waxing and waning mental status and agitation in the setting of known dementia is most consistent with delirium.   Current outpatient psychotropic medications (including start dates) are detailed below and historically he has had a unknown response to these medications. He was likely compliant with medications prior to admission as he came from a nursing home. Has significant degree of antipsychotic polypharmacy. On initial examination, patient was unable to engage with interview in any meaningful sense of the word. Please see plan below for detailed recommendations.   Psychotropic meds prior to admission:  Trazodone 100 mg start 07/31/21 Quetiapine 50 mg start date 07/31/21 Haloperidol decanoate 10 mg 9/12//22 (last received 07/16/21) Risperidone 1 mg QHS start date 04/23/21 Sertraline 100 mg start 07/04/21  I did not see klonopin reflected in [facility] (reported to be home med in initial H&P)  Numerous other meds in [facility] seem to predate psychotropic medications, which have generally been started recently.   Was notably started on tamiflu recently (10/29); wonder if flu diagnosis and/or tamiflu has contributed to current meds.   After my eval, primary concern is for muscle stiffness. Able to phonate, no apparent airway compromise. Asked primary team to add on CK and assess.   Diagnoses:  Active Hospital problems: Principal  Problem:   Unstable gait Active Problems:   Frontotemporal dementia with behavioral disturbance (HCC)   Encephalopathy acute   AMS (altered mental status)   Hypokalemia    Problems edited/added by me: No problems updated.  Plan  ## Safety and Observation Level:  - appropriate for telesitter.    ## Medications:  -- continue to HOLD home meds -- for agitation  - 1 mg haldol LINKED WITH 0.5 mg ativan q6 PRN - if klonopin confirmed to be home med, start at 1/2 home dose (balancing deliriogenic potential and   ## Medical Decision Making Capacity:  Not formally assessed  ## Further Work-up:  -- CMP, CK, lipase/amylase - consider depakote tomorrow if continued agitation  ## Disposition:  -- SNF  Thank you for this consult request. Recommendations have been communicated to the primary team.  We will continue to follow at this time.   [name] A [name]    NEW  history  Relevant Aspects of Hospital Course:  Admitted on 08/05/2021 for gait change/change in baseline mental status.  Patient Report:  Patient unable to interact meanignfully. Unable to state name. Unable to follow commands. Occasionally says things that are not germane to conversation at hand, ie "I used to read a lot".   ROS:  Pt unable to   Collateral information:  MAR from facility  Psychiatric History:  Information collected from pt, [facility]  Family psych history: unknown  Medical History: Past Medical History:  Diagnosis Date   Hypertension     Surgical History: History reviewed. No pertinent surgical history.  Medications:   Current Facility-Administered Medications:    acetaminophen (TYLENOL) tablet 650 mg, 650 mg, Oral, Q4H PRN **OR** acetaminophen (  TYLENOL) 160 MG/5ML solution 650 mg, 650 mg, Per Tube, Q4H PRN **OR** acetaminophen (TYLENOL) suppository 650 mg, 650 mg, Rectal, Q4H PRN, Charlsie Quest, MD   apixaban (ELIQUIS) tablet 10 mg, 10 mg, Oral, BID **FOLLOWED BY** [START ON  08/14/2021] apixaban (ELIQUIS) tablet 5 mg, 5 mg, Oral, BID, Jamelle Rushing L, MD   metoprolol tartrate (LOPRESSOR) injection 2.5 mg, 2.5 mg, Intravenous, Q12H, Leeroy Bock, MD  Allergies: No Known Allergies  Social History:  Could not obtain  Family History: Could not obtain The patient's family history includes Dementia in his father, paternal aunt, paternal grandfather, and paternal uncle; Parkinsonism in his father.    Objective  Vital signs:  Temp:  [98.9 F (37.2 C)-99.2 F (37.3 C)] 98.9 F (37.2 C) (11/01 0946) Pulse Rate:  [55-123] 104 (11/01 0946) Resp:  [16-20] 20 (11/01 0946) BP: (121-181)/(72-143) 173/72 (11/01 0946) SpO2:  [92 %-98 %] 93 % (11/01 0946) Weight:  [75.9 kg] 75.9 kg (11/01 0946)  Physical Exam: Head: normocephalic, atraumatic Pulm: no increased WOB Psych: alert, not oriented (tracked examiner with eyes) MSK: significant rigidity in all limbs. Mildly distractible in arms (ie still rigid but less rigid when distracted), not distractible in legs.   Mental Status Exam: Unable to meaningfully participate.

## 2021-08-07 NOTE — Evaluation (Signed)
Clinical/Bedside Swallow Evaluation Patient Details  Name: [name] MRN: [id number] Date of Birth: 1956/09/02  Today's Date: 08/07/2021 Time: Micheal Start Time (ACUTE ONLY): 1205 Micheal Stop Time (ACUTE ONLY): 1230 Micheal Time Calculation (min) (ACUTE ONLY): 25 min  Past Medical History:  Past Medical History:  Diagnosis Date   Hypertension    Past Surgical History: History reviewed. No pertinent surgical history. HPI:  Per Md note "M[name] is a 65 y.o. male with a PMH significant for frontotemporal dementia with behavioral disturbance, HTN.  They presented from nursing home to the ED on 08/05/2021 with change in baseline mental status x 1 days. In the ED, it was found that they had encephalopathy. They were treated with heparin gtt and IV fluids.  Neurology was consulted.  Patient was admitted to medicine service for further workup and management of AMS as outlined in detail below."  Per MRI; Pronounced motion degradation due to patient confusion. Allowing for  that, no abnormality is seen. No explanation is offered on the basis of imaging.  Pt is staying at [name].    Assessment / Plan / Recommendation  Clinical Impression  Patient presents with clinical indications of mild suspect oral dysphagia c/b delayed 2nd swallow - presumed due to oral retention.  Pharyngeal swallow clinically appeared timely without indication of aspiration/penetration.  Pt passed 3 ounce Yale test fortuantely.  Due to pt's consistent delayed 2nd swallow *presumed oral retention*, recommend dys3/thin diet.  Recommend pt have pills with puree *try whole but likely need to crush.  Pt did not open his mouth adequately to assess for oral retention - but mastication appeared rotary - but recommend pt sit upright after meals.  Will follow up briefly to assure tolerating po diet. Micheal Visit Diagnosis: Dysphagia, oral phase (R13.11);Dysphagia, unspecified (R13.10)    Aspiration Risk  Mild aspiration risk     Diet Recommendation Dysphagia 3 (Mech soft);Thin liquid   Liquid Administration via: Straw Medication Administration: Whole meds with puree Supervision: Full supervision/cueing for compensatory strategies Compensations: Slow rate;Small sips/bites Postural Changes: Seated upright at 90 degrees;Remain upright for at least 30 minutes after po intake    Other  Recommendations Oral Care Recommendations: Oral care BID    Recommendations for follow up therapy are one component of a multi-disciplinary discharge planning process, led by the attending physician.  Recommendations may be updated based on patient status, additional functional criteria and insurance authorization.  Follow up Recommendations        Frequency and Duration min 1 x/week  1 week       Prognosis Prognosis for Safe Diet Advancement: Fair Barriers to Reach Goals: Cognitive deficits      Swallow Study   General Date of Onset: 08/07/21 HPI: Per Md note "[name] is a 65 y.o. male with a PMH significant for frontotemporal dementia with behavioral disturbance, HTN.  They presented from nursing home to the ED on 08/05/2021 with change in baseline mental status x 1 days. In the ED, it was found that they had encephalopathy. They were treated with heparin gtt and IV fluids.  Neurology was consulted.  Patient was admitted to medicine service for further workup and management of AMS as outlined in detail below."  Per MRI; Pronounced motion degradation due to patient confusion. Allowing for  that, no abnormality is seen. No explanation is offered on the basis of imaging.  Pt is staying at [name]. Type of Study: Bedside Swallow Evaluation Temperature Spikes Noted: No Respiratory Status: Room air  History of Recent Intubation: No Behavior/Cognition: Alert Oral Cavity Assessment: Other (comment) (pt did not open mouth adequately for oral assessment, he does not mimic S) Oral Care Completed by Micheal: No Oral Cavity -  Dentition: Adequate natural dentition Self-Feeding Abilities: Able to feed self Patient Positioning: Upright in bed Baseline Vocal Quality: Normal Volitional Cough: Cognitively unable to elicit Volitional Swallow: Unable to elicit    Oral/Motor/Sensory Function Overall Oral Motor/Sensory Function: Within functional limits (no focal CN deficits from clinical observation)   Ice Chips Ice chips: Not tested   Thin Liquid Thin Liquid: Impaired Presentation: Straw Pharyngeal  Phase Impairments: Suspected delayed Swallow    Nectar Thick Nectar Thick Liquid: Within functional limits Presentation: Straw   Honey Thick Honey Thick Liquid: Not tested   Puree Puree: Impaired Presentation: Self Fed;Spoon Pharyngeal Phase Impairments: Suspected delayed Swallow   Solid     Solid: Impaired Oral Phase Functional Implications: Impaired mastication Pharyngeal Phase Impairments: Suspected delayed Swallow      [name] 08/07/2021,12:51 PM  [name] [phone] Pager [phone]

## 2021-08-07 NOTE — Progress Notes (Addendum)
PROGRESS NOTE  Micheal [name]    DOB: 05-10-1956, 65 y.o.  PFX:[id number]  PCP: Kirby Funk, MD   Code Status: DNR   DOA: 08/05/2021   LOS: 1  Brief Narrative of Current Hospitalization  Micheal [name] is a 65 y.o. male with a PMH significant for frontotemporal dementia with behavioral disturbance, HTN. They presented from nursing home to the ED on 08/05/2021 with change in baseline mental status x 1 days. In the ED, it was found that they had encephalopathy. They were treated with heparin gtt and IV fluids.  Neurology was consulted. Patient was admitted to medicine service for further workup and management of AMS as outlined in detail below.  08/07/21 -stable  Assessment & Plan  Principal Problem:   Unstable gait Active Problems:   Frontotemporal dementia with behavioral disturbance (HCC)   Encephalopathy acute   AMS (altered mental status)  Acute metabolic encephalopathy- Head CT negative. Brain MRI poorly visualized due to movement but not read as acute changes. Echo with bubble study was negative. Urinalysis, skin exam, and pulmonary imaging negative for infection. Vital signs stable ORA other than low-normal temperature. ABG essentially normal. LA normal. - neurology following, appreciate recs  - EEG - repeat CMP - review home medications (has klonopin, haldol, seroquel, risperdal, zoloft, trazodone on home med list). Holding for now and monitor for withdrawal effects.  - appreciate psychiatry recommendations in adjustments to meds - SLP, PT/OT evaluation - delirium precautions - attempted to get patient a sitter but none were available - likely dc back to SNF, TOC consulted  Acute pulmonary embolism- no heart strain on echo. No acute respiratory distress observed. Lower extremity doppler positive for DVT.  CHA2DS2-VASc Score =  3  CTA showing right upper lobe pulmonary emboli described as small clot burden - patient on heparin gtt- can transition to eliquis.   Mild  Hypokalemia- resolved. K+ 3.6 today - BMP am  Frontotemporal dementia- episodes of agitation and sedation - holding sedative medicines as able  HTN- not on home medications. Normotensive during admission initially but now having elevated readings consistently. - initiate metoprolol IV as patient is not taking PO at this time. Can transition to PO medication once improving.   - continue to monitor  DVT prophylaxis: heparin>eliquis  Diet:  Diet Orders (From admission, onward)     Start     Ordered   08/07/21 1217  DIET DYS 3 Room service appropriate? No; Fluid consistency: Thin  Diet effective now       Comments: Pureed meats, extra gravy and sauce  Question Answer Comment  Room service appropriate? No   Fluid consistency: Thin      08/07/21 1216            Subjective 08/07/21    Pt unable to provide history  Disposition Plan & Communication  Patient status: Inpatient  Admitted From: SNF Disposition: Skilled nursing facility Anticipated discharge date: 11/2  Family Communication: none  Consults, Procedures, Significant Events  Consultants:  Neurology Psychiatry   Procedures/significant events:  Brain MRI Echo EEG  Antimicrobials:  Anti-infectives (From admission, onward)    None       Objective   Vitals:   08/07/21 0735 08/07/21 0747 08/07/21 0823 08/07/21 0946  BP:  (!) 157/103  (!) 173/72  Pulse:  (!) 106  (!) 104  Resp:  18  20  Temp: 99.2 F (37.3 C)  98.9 F (37.2 C) 98.9 F (37.2 C)  TempSrc: Oral  Rectal Oral  SpO2:  95%  93%  Weight:    75.9 kg  Height:        Intake/Output Summary (Last 24 hours) at 08/07/2021 1242 Last data filed at 08/06/2021 1633 Gross per 24 hour  Intake 996.72 ml  Output --  Net 996.72 ml   Filed Weights   08/06/21 0526 08/07/21 0946  Weight: 81.6 kg 75.9 kg    Patient BMI: Body mass index is 22.69 kg/m.   Physical Exam: General: asleep, NAD HEENT: atraumatic, clear conjunctiva, anicteric sclera,  moist mucus membranes Respiratory: normal respiratory effort. Clear throughout lung fields Cardiovascular: normal S1/S2,  RRR, no JVD, murmurs, rubs, gallops, quick capillary refill  Gastrointestinal: soft, ND Nervous: asleep. Awakens to verbal stimuli. Unable to follow commands Extremities:no edema, normal tone Skin: dry, intact, normal temperature, normal color, No rashes, lesions or ulcers  Labs   I have personally reviewed following labs and imaging studies Admission on 08/05/2021  Component Date Value Ref Range Status   SARS Coronavirus 2 by RT PCR 08/05/2021 NEGATIVE  NEGATIVE Final   Influenza A by PCR 08/05/2021 NEGATIVE  NEGATIVE Final   Influenza B by PCR 08/05/2021 NEGATIVE  NEGATIVE Final   Sodium 08/05/2021 140  135 - 145 mmol/L Final   Potassium 08/05/2021 3.3 (A)  3.5 - 5.1 mmol/L Final   Chloride 08/05/2021 98  98 - 111 mmol/L Final   BUN 08/05/2021 16  8 - 23 mg/dL Final   Creatinine, Ser 08/05/2021 0.70  0.61 - 1.24 mg/dL Final   Glucose, Bld 93/90/3009 131 (A)  70 - 99 mg/dL Final   Calcium, Ion 23/30/0762 1.17  1.15 - 1.40 mmol/L Final   TCO2 08/05/2021 31  22 - 32 mmol/L Final   Hemoglobin 08/05/2021 12.6 (A)  13.0 - 17.0 g/dL Final   HCT 26/33/3545 37.0 (A)  39.0 - 52.0 % Final   Prothrombin Time 08/05/2021 13.5  11.4 - 15.2 seconds Final   INR 08/05/2021 1.0  0.8 - 1.2 Final   aPTT 08/05/2021 31  24 - 36 seconds Final   WBC 08/05/2021 7.4  4.0 - 10.5 K/uL Final   RBC 08/05/2021 3.87 (A)  4.22 - 5.81 MIL/uL Final   Hemoglobin 08/05/2021 12.7 (A)  13.0 - 17.0 g/dL Final   HCT 62/56/3893 38.0 (A)  39.0 - 52.0 % Final   MCV 08/05/2021 98.2  80.0 - 100.0 fL Final   MCH 08/05/2021 32.8  26.0 - 34.0 pg Final   MCHC 08/05/2021 33.4  30.0 - 36.0 g/dL Final   RDW 73/42/8768 13.2  11.5 - 15.5 % Final   Platelets 08/05/2021 148 (A)  150 - 400 K/uL Final   nRBC 08/05/2021 0.0  0.0 - 0.2 % Final   Neutrophils Relative % 08/05/2021 74  % Final   Neutro Abs 08/05/2021  5.4  1.7 - 7.7 K/uL Final   Lymphocytes Relative 08/05/2021 16  % Final   Lymphs Abs 08/05/2021 1.2  0.7 - 4.0 K/uL Final   Monocytes Relative 08/05/2021 10  % Final   Monocytes Absolute 08/05/2021 0.7  0.1 - 1.0 K/uL Final   Eosinophils Relative 08/05/2021 0  % Final   Eosinophils Absolute 08/05/2021 0.0  0.0 - 0.5 K/uL Final   Basophils Relative 08/05/2021 0  % Final   Basophils Absolute 08/05/2021 0.0  0.0 - 0.1 K/uL Final   Immature Granulocytes 08/05/2021 0  % Final   Abs Immature Granulocytes 08/05/2021 0.02  0.00 - 0.07 K/uL Final   Sodium 08/05/2021 140  135 - 145 mmol/L Final   Potassium 08/05/2021 3.3 (A)  3.5 - 5.1 mmol/L Final   Chloride 08/05/2021 102  98 - 111 mmol/L Final   CO2 08/05/2021 30  22 - 32 mmol/L Final   Glucose, Bld 08/05/2021 135 (A)  70 - 99 mg/dL Final   BUN 40/98/1191 18  8 - 23 mg/dL Final   Creatinine, Ser 08/05/2021 0.62  0.61 - 1.24 mg/dL Final   Calcium 47/82/9562 8.6 (A)  8.9 - 10.3 mg/dL Final   Total Protein 13/05/6577 6.1 (A)  6.5 - 8.1 g/dL Final   Albumin 46/96/2952 3.6  3.5 - 5.0 g/dL Final   AST 84/13/2440 27  15 - 41 U/L Final   ALT 08/05/2021 15  0 - 44 U/L Final   Alkaline Phosphatase 08/05/2021 63  38 - 126 U/L Final   Total Bilirubin 08/05/2021 0.6  0.3 - 1.2 mg/dL Final   GFR, Estimated 08/05/2021 >60  >60 mL/min Final   Anion gap 08/05/2021 8  5 - 15 Final   Opiates 08/06/2021 NONE DETECTED  NONE DETECTED Final   Cocaine 08/06/2021 NONE DETECTED  NONE DETECTED Final   Benzodiazepines 08/06/2021 NONE DETECTED  NONE DETECTED Final   Amphetamines 08/06/2021 NONE DETECTED  NONE DETECTED Final   Tetrahydrocannabinol 08/06/2021 NONE DETECTED  NONE DETECTED Final   Barbiturates 08/06/2021 NONE DETECTED  NONE DETECTED Final   Color, Urine 08/06/2021 YELLOW  YELLOW Final   APPearance 08/06/2021 CLEAR  CLEAR Final   Specific Gravity, Urine 08/06/2021 1.035 (A)  1.005 - 1.030 Final   pH 08/06/2021 8.0  5.0 - 8.0 Final   Glucose, UA  08/06/2021 NEGATIVE  NEGATIVE mg/dL Final   Hgb urine dipstick 08/06/2021 NEGATIVE  NEGATIVE Final   Bilirubin Urine 08/06/2021 NEGATIVE  NEGATIVE Final   Ketones, ur 08/06/2021 NEGATIVE  NEGATIVE mg/dL Final   Protein, ur 08/03/2535 NEGATIVE  NEGATIVE mg/dL Final   Nitrite 64/40/3474 NEGATIVE  NEGATIVE Final   Leukocytes,Ua 08/06/2021 TRACE (A)  NEGATIVE Final   RBC / HPF 08/06/2021 6-10  0 - 5 RBC/hpf Final   WBC, UA 08/06/2021 11-20  0 - 5 WBC/hpf Final   Bacteria, UA 08/06/2021 NONE SEEN  NONE SEEN Final   Glucose-Capillary 08/05/2021 132 (A)  70 - 99 mg/dL Final   HIV Screen 4th Generation wRfx 08/06/2021 Non Reactive  Non Reactive Final   Hgb A1c MFr Bld 08/06/2021 5.6  4.8 - 5.6 % Final   Mean Plasma Glucose 08/06/2021 114.02  mg/dL Final   Cholesterol 25/95/6387 122  0 - 200 mg/dL Final   Triglycerides 56/43/3295 35  <150 mg/dL Final   HDL 18/84/1660 45  >40 mg/dL Final   Total CHOL/HDL Ratio 08/06/2021 2.7  RATIO Final   VLDL 08/06/2021 7  0 - 40 mg/dL Final   LDL Cholesterol 08/06/2021 70  0 - 99 mg/dL Final   WBC 63/10/6008 7.9  4.0 - 10.5 K/uL Final   RBC 08/06/2021 3.95 (A)  4.22 - 5.81 MIL/uL Final   Hemoglobin 08/06/2021 12.8 (A)  13.0 - 17.0 g/dL Final   HCT 93/23/5573 39.1  39.0 - 52.0 % Final   MCV 08/06/2021 99.0  80.0 - 100.0 fL Final   MCH 08/06/2021 32.4  26.0 - 34.0 pg Final   MCHC 08/06/2021 32.7  30.0 - 36.0 g/dL Final   RDW 22/11/5425 13.0  11.5 - 15.5 % Final   Platelets 08/06/2021 158  150 - 400 K/uL Final   nRBC 08/06/2021 0.0  0.0 - 0.2 % Final   Weight 08/06/2021 2,880  oz Final   Height 08/06/2021 72  in Final   BP 08/06/2021 136/74  mmHg Final   S' Lateral 08/06/2021 3.50  cm Final   Area-P 1/2 08/06/2021 3.56  cm2 Final   Heparin Unfractionated 08/06/2021 0.63  0.30 - 0.70 IU/mL Final   FIO2 08/06/2021 21.00   Final   O2 Content 08/06/2021 ROOM AIR  L/min Final   Delivery systems 08/06/2021 ROOM AIR   Final   pH, Arterial 08/06/2021 7.451 (A)   7.350 - 7.450 Final   pCO2 arterial 08/06/2021 39.1  32.0 - 48.0 mmHg Final   pO2, Arterial 08/06/2021 75.0 (A)  83.0 - 108.0 mmHg Final   Bicarbonate 08/06/2021 26.9  20.0 - 28.0 mmol/L Final   Acid-Base Excess 08/06/2021 3.2 (A)  0.0 - 2.0 mmol/L Final   O2 Saturation 08/06/2021 95.2  % Final   Patient temperature 08/06/2021 98.6   Final   Collection site 08/06/2021 RIGHT BRACHIAL   Final   Drawn by 08/06/2021 54627   Final   Allens test (pass/fail) 08/06/2021 PASS  PASS Final   Lactic Acid, Venous 08/06/2021 0.9  0.5 - 1.9 mmol/L Final   WBC 08/07/2021 7.4  4.0 - 10.5 K/uL Final   RBC 08/07/2021 3.76 (A)  4.22 - 5.81 MIL/uL Final   Hemoglobin 08/07/2021 12.2 (A)  13.0 - 17.0 g/dL Final   HCT 03/50/0938 36.5 (A)  39.0 - 52.0 % Final   MCV 08/07/2021 97.1  80.0 - 100.0 fL Final   MCH 08/07/2021 32.4  26.0 - 34.0 pg Final   MCHC 08/07/2021 33.4  30.0 - 36.0 g/dL Final   RDW 18/29/9371 12.9  11.5 - 15.5 % Final   Platelets 08/07/2021 148 (A)  150 - 400 K/uL Final   nRBC 08/07/2021 0.0  0.0 - 0.2 % Final   Sodium 08/07/2021 140  135 - 145 mmol/L Final   Potassium 08/07/2021 3.6  3.5 - 5.1 mmol/L Final   Chloride 08/07/2021 106  98 - 111 mmol/L Final   CO2 08/07/2021 26  22 - 32 mmol/L Final   Glucose, Bld 08/07/2021 81  70 - 99 mg/dL Final   BUN 69/67/8938 10  8 - 23 mg/dL Final   Creatinine, Ser 08/07/2021 0.63  0.61 - 1.24 mg/dL Final   Calcium 07/23/5101 8.3 (A)  8.9 - 10.3 mg/dL Final   GFR, Estimated 08/07/2021 >60  >60 mL/min Final   Anion gap 08/07/2021 8  5 - 15 Final   Heparin Unfractionated 08/07/2021 0.72 (A)  0.30 - 0.70 IU/mL Final   Heparin Unfractionated 08/07/2021 0.53  0.30 - 0.70 IU/mL Final    Imaging Studies  VAS [name] LOWER EXTREMITY VENOUS (DVT)  Result Date: 08/06/2021  Lower Venous DVT Study Patient Name:  RAYE SLYTER  Date of Exam:   08/06/2021 Medical Rec #: [id number]        Accession #:    [id number] Date of Birth: 01-10-1956        Patient Gender: M  Patient Age:   20 years Exam Location:  [facility] Procedure:      VAS [name] LOWER EXTREMITY VENOUS (DVT) Referring Phys: Jamelle Rushing --------------------------------------------------------------------------------  Indications: Pulmonary embolism.  Risk Factors: None identified. Limitations: Poor ultrasound/tissue interface and patient positioning, constant patient movement, poor patient cooperation. Comparison Study: No prior studies. Performing Technologist: Chanda Busing RVT  Examination Guidelines: A complete evaluation includes B-mode imaging, spectral Doppler, color Doppler, and  power Doppler as needed of all accessible portions of each vessel. Bilateral testing is considered an integral part of a complete examination. Limited examinations for reoccurring indications may be performed as noted. The reflux portion of the exam is performed with the patient in reverse Trendelenburg.  +---------+---------------+---------+-----------+----------+--------------+ RIGHT    CompressibilityPhasicitySpontaneityPropertiesThrombus Aging +---------+---------------+---------+-----------+----------+--------------+ CFV      Full           Yes      Yes                                 +---------+---------------+---------+-----------+----------+--------------+ SFJ      Full                                                        +---------+---------------+---------+-----------+----------+--------------+ FV Prox  Full                                                        +---------+---------------+---------+-----------+----------+--------------+ FV Mid   Full                                                        +---------+---------------+---------+-----------+----------+--------------+ FV DistalFull                                                        +---------+---------------+---------+-----------+----------+--------------+ PFV      Full                                                         +---------+---------------+---------+-----------+----------+--------------+ POP      None           No       No                   Acute          +---------+---------------+---------+-----------+----------+--------------+ PTV      Full                                                        +---------+---------------+---------+-----------+----------+--------------+ PERO     Full                                                        +---------+---------------+---------+-----------+----------+--------------+   +---------+---------------+---------+-----------+----------+--------------+  LEFT     CompressibilityPhasicitySpontaneityPropertiesThrombus Aging +---------+---------------+---------+-----------+----------+--------------+ CFV      Full           Yes      Yes                                 +---------+---------------+---------+-----------+----------+--------------+ SFJ      Full                                                        +---------+---------------+---------+-----------+----------+--------------+ FV Prox  Full                                                        +---------+---------------+---------+-----------+----------+--------------+ FV Mid   Full                                                        +---------+---------------+---------+-----------+----------+--------------+ FV DistalFull                                                        +---------+---------------+---------+-----------+----------+--------------+ PFV      Full                                                        +---------+---------------+---------+-----------+----------+--------------+ POP      Full           Yes      Yes                                 +---------+---------------+---------+-----------+----------+--------------+ PTV      Full                                                         +---------+---------------+---------+-----------+----------+--------------+ PERO     Full                                                        +---------+---------------+---------+-----------+----------+--------------+    Summary: RIGHT: - Findings consistent with acute deep vein thrombosis involving the right popliteal vein. - No cystic structure found in the popliteal fossa.  LEFT: - There is no evidence of deep vein thrombosis in the lower extremity. However, portions of this examination  were limited- see technologist comments above.  - No cystic structure found in the popliteal fossa.  *See table(s) above for measurements and observations.    Preliminary    Medications   Scheduled Meds:   No recently discontinued medications to reconcile  LOS: 1 day   Time spent: >81min  Leeroy Bock, DO [facility] 08/07/2021, 12:42 PM   Please refer to amion to contact the [facility] Attending or Consulting provider for this pt  [email or web address] Available by Epic secure chat 7AM-7PM. If 7PM-7AM, please contact night-coverage

## 2021-08-07 NOTE — Evaluation (Signed)
Physical Therapy Evaluation Patient Details Name: Micheal [name] MRN: [id number] DOB: 05-05-1956 Today's Date: 08/07/2021  History of Present Illness  Per Md note "Micheal [name] is a 65 y.o. male with a PMH significant for frontotemporal dementia with behavioral disturbance, HTN.  They presented from nursing home to the ED on 08/05/2021 with change in baseline mental status . Admitted with encephalopathy and PE.  Clinical Impression  Patient awake , sitting upright in bed, safety restraints on both hands. Patient awake, somewhat resistive to attempts to move.  Both legs over bed edge, assisted legs onto bed. Patient  lifted legs and placed over to the other side/. Unable to safely attempt sitting or standing. Continue PT for attempts to mobilize as patient able to participate.     Recommendations for follow up therapy are one component of a multi-disciplinary discharge planning process, led by the attending physician.  Recommendations may be updated based on patient status, additional functional criteria and insurance authorization.  Follow Up Recommendations Skilled nursing-short term rehab (<3 hours/day)    Assistance Recommended at Discharge Frequent or constant Supervision/Assistance  Functional Status Assessment Patient has had a recent decline in their functional status and/or demonstrates limited ability to make significant improvements in function in a reasonable and predictable amount of time  Equipment Recommendations       Recommendations for Other Services       Precautions / Restrictions Precautions Precautions: Fall Precaution Comments: agitated      Mobility  Bed Mobility               General bed mobility comments: unable to safely assist    Transfers                   General transfer comment: unable.    Ambulation/Gait                Stairs            Wheelchair Mobility    Modified Rankin (Stroke Patients Only)        Balance                                             Pertinent Vitals/Pain Pain Assessment: PAINAD Breathing: normal Negative Vocalization: none Facial Expression: smiling or inexpressive Body Language: rigid, fists clenched, knees up, pushing/pulling away, strikes out Consolability: no need to console PAINAD Score: 0    Home Living Family/patient expects to be discharged to:: Assisted living                   Additional Comments: Per notes, pt from ALF.    Prior Function               Mobility Comments: per chart, ambulatory at baseline, demntia       Hand Dominance        Extremity/Trunk Assessment   Upper Extremity Assessment Upper Extremity Assessment:  (both restrained , did undo restraints due to increased tenseness.)    Lower Extremity Assessment Lower Extremity Assessment:  (patient has both legs  over bed edge, then lifted both into air and placed over to the other side.)    Cervical / Trunk Assessment Cervical / Trunk Assessment: Other exceptions Cervical / Trunk Exceptions: keeps trunk tense inflexion, holds head upright when sitting with HOB in sitting.  Communication   Communication: Expressive difficulties  Cognition Arousal/Alertness: Awake/alert Behavior During Therapy: Flat affect                                   General Comments: nonverbal except  said a few words non sensible.        General Comments General comments (skin integrity, edema, etc.): sits in bed at midline, legs over bed, deemed not safe to assist due to potential not being able to get back into bed.    Exercises     Assessment/Plan    PT Assessment Patient needs continued PT services  PT Problem List Decreased mobility;Decreased safety awareness;Impaired tone;Decreased cognition       PT Treatment Interventions      PT Goals (Current goals can be found in the Care Plan section)  Acute Rehab PT Goals PT Goal  Formulation: Patient unable to participate in goal setting Time For Goal Achievement: 08/21/21 Potential to Achieve Goals: Fair    Frequency Min 2X/week   Barriers to discharge        Co-evaluation               AM-PAC PT "6 Clicks" Mobility  Outcome Measure Help needed turning from your back to your side while in a flat bed without using bedrails?: Total Help needed moving from lying on your back to sitting on the side of a flat bed without using bedrails?: Total Help needed moving to and from a bed to a chair (including a wheelchair)?: Total Help needed standing up from a chair using your arms (e.g., wheelchair or bedside chair)?: Total Help needed to walk in hospital room?: Total Help needed climbing 3-5 steps with a railing? : Total 6 Click Score: 6    End of Session   Activity Tolerance: Patient tolerated treatment well Patient left: in bed;with call bell/phone within reach;with bed alarm set;with restraints reapplied Nurse Communication: Mobility status PT Visit Diagnosis: Muscle weakness (generalized) (M62.81)    Time: 3614-4315 PT Time Calculation (min) (ACUTE ONLY): 12 min   Charges:   PT Evaluation $PT Eval Low Complexity: 1 Low          Blanchard Kelch PT Acute Rehabilitation Services Pager [phone] Office [phone]   Rada Hay 08/07/2021, 1:30 PM

## 2021-08-07 NOTE — ED Notes (Signed)
Attempted to take pt upstairs earlier, floor refused d/t MEWS. Taken back down to ED, vitals updated, pt changed and provided new linens. Chat sent to RN Dekina, confirmed w/ floor charge and MD that pt was MS appropriate.

## 2021-08-07 NOTE — Progress Notes (Signed)
ANTICOAGULATION CONSULT NOTE  Pharmacy Consult for Heparin - change to apixaban  Indication: pulmonary embolus  No Known Allergies  Patient Measurements: Height: 6' (182.9 cm) Weight: 75.9 kg (167 lb 5.3 oz) IBW/kg (Calculated) : 77.6 Ht: 6 feet Heparin Dosing Weight: 86 kg actual weight  Vital Signs: Temp: 98.9 F (37.2 C) (11/01 0946) Temp Source: Oral (11/01 0946) BP: 173/72 (11/01 0946) Pulse Rate: 104 (11/01 0946)  Labs: Recent Labs    08/05/21 1924 08/05/21 1929 08/06/21 1633 08/07/21 0244 08/07/21 0425 08/07/21 1134  HGB 12.7* 12.6* 12.8*  --  12.2*  --   HCT 38.0* 37.0* 39.1  --  36.5*  --   PLT 148*  --  158  --  148*  --   APTT 31  --   --   --   --   --   LABPROT 13.5  --   --   --   --   --   INR 1.0  --   --   --   --   --   HEPARINUNFRC  --   --  0.63 0.72*  --  0.53  CREATININE 0.62 0.70  --   --  0.63  --      Estimated Creatinine Clearance: 98.8 mL/min (by C-G formula based on SCr of 0.63 mg/dL).   Medical History: Past Medical History:  Diagnosis Date   Hypertension    Assessment: 65 y/o M with a h/o dementia admitted from nursing home for abnormal gait.CTA revealed PE. Pharmacy consulted for heparin infusion.   Today, 08/07/2021: HL now therapeutic on current IV heparin rate of 1350 units/hr after being decreased early this AM Hgb and Plt both slightly low SCr WNL No bleeding or infusion issues per RN  Goal of Therapy:  Heparin level 0.3-0.7 units/ml Monitor platelets by anticoagulation protocol: Yes   Plan:  Patient has now been changed to a diet after swallow eval per RN, so plan now per Md is to change IV heparin to apixaban Discontinue IV heparin and start apixaban 10mg  BID x 7 days then switch to apixaban 5mg  BID thereafter Will counsel patient prior to discharge if not sooner Daily CBC     PharmD, BCPS 08/07/2021,12:42 PM

## 2021-08-07 NOTE — Progress Notes (Signed)
OT Cancellation Note  Patient Details Name: Micheal [name] MRN: [id number] DOB: 09-10-1956   Cancelled Treatment:    Reason Eval/Treat Not Completed: Patient not medically ready  Sharyn Blitz OTR/L, MS Acute Rehabilitation Department Office# [phone] Pager# [phone]  08/07/2021, 12:48 PM

## 2021-08-07 NOTE — ED Notes (Signed)
Per Kary Kos, plan is to hold him in the ED until a sitter is available at 0300.  Thank you.

## 2021-08-07 NOTE — Progress Notes (Signed)
   08/07/21 0946  Assess: MEWS Score  Temp 98.9 F (37.2 C)  BP (!) 173/72  Pulse Rate (!) 104  Resp 20  Level of Consciousness Responds to Pain  SpO2 93 %  O2 Device Room [facility]

## 2021-08-08 DIAGNOSIS — R2681 Unsteadiness on feet: Secondary | ICD-10-CM | POA: Diagnosis not present

## 2021-08-08 LAB — CBC
HCT: 39.6 % (ref 39.0–52.0)
Hemoglobin: 13.1 g/dL (ref 13.0–17.0)
MCH: 32 pg (ref 26.0–34.0)
MCHC: 33.1 g/dL (ref 30.0–36.0)
MCV: 96.6 fL (ref 80.0–100.0)
Platelets: 154 10*3/uL (ref 150–400)
RBC: 4.1 MIL/uL — ABNORMAL LOW (ref 4.22–5.81)
RDW: 12.8 % (ref 11.5–15.5)
WBC: 6.5 10*3/uL (ref 4.0–10.5)
nRBC: 0 % (ref 0.0–0.2)

## 2021-08-08 LAB — CK: Total CK: 1030 U/L — ABNORMAL HIGH (ref 49–397)

## 2021-08-08 MED ORDER — RISPERIDONE 0.25 MG PO TABS: 0.5000 mg | ORAL_TABLET | Freq: Every day | ORAL | Status: DC

## 2021-08-08 NOTE — Progress Notes (Signed)
Micheal [name]  NWG:[id number] DOB: [birth year] DOA: 08/05/2021 PCP: Kirby Funk, MD    Brief Narrative:  65 year old with a history of frontotemporal dementia and HTN who presented to the ED from his nursing home 10/30 with altered mental status.  Consultants:  Neurology Psychiatry  Code Status: NO CODE BLUE  Antimicrobials:  None  DVT prophylaxis: Eliquis  Subjective: Resting comfortably at the time of my evaluation.  Awakens to my exam.  Is pleasant and interactive but does not provide a reliable history.  Assessment & Plan:  Altered mental status CT head unrevealing -brain MRI without acute findings -TTE with bubble study negative -UA and CXR without evidence of acute infection -vital stable -ABG unrevealing -EEG -have minimize sedating medications with assistance from Psychiatry  Acute pulmonary embolism -acute right popliteal DVT CTa noted right upper lobe pulmonary emboli with a small clot burden -transitioned from heparin drip to Eliquis  Frontotemporal dementia Episodes of agitation and intermittent sedation at baseline  HTN Does not appear to require chronic medical therapy  Hypokalemia Resolved   Family Communication: No family present at time of exam Status is: Inpatient -awaiting SNF placement for rehab stay as recommended by PT   Objective: Blood pressure (!) 148/92, pulse 87, temperature 97.7 F (36.5 C), temperature source Oral, resp. rate 18, height 6' (1.829 m), weight 75.9 kg, SpO2 96 %.  Intake/Output Summary (Last 24 hours) at 08/08/2021 1030 Last data filed at 08/07/2021 2030 Gross per 24 hour  Intake 668.57 ml  Output --  Net 668.57 ml   Filed Weights   08/06/21 0526 08/07/21 0946  Weight: 81.6 kg 75.9 kg    Examination: General: No acute respiratory distress Lungs: Clear to auscultation bilaterally without wheezes or crackles Cardiovascular: Regular rate and rhythm without murmur gallop or rub normal S1 and S2 Abdomen:  Nontender, nondistended, soft, bowel sounds positive, no rebound, no ascites, no appreciable mass Extremities: No significant cyanosis, clubbing, or edema bilateral lower extremities  CBC: Recent Labs  Lab 08/05/21 1924 08/05/21 1929 08/06/21 1633 08/07/21 0425 08/08/21 0509  WBC 7.4  --  7.9 7.4 6.5  NEUTROABS 5.4  --   --   --   --   HGB 12.7*   < > 12.8* 12.2* 13.1  HCT 38.0*   < > 39.1 36.5* 39.6  MCV 98.2  --  99.0 97.1 96.6  PLT 148*  --  158 148* 154   < > = values in this interval not displayed.   Basic Metabolic Panel: Recent Labs  Lab 08/05/21 1924 08/05/21 1929 08/07/21 0425  NA 140 140 140  K 3.3* 3.3* 3.6  CL 102 98 106  CO2 30  --  26  GLUCOSE 135* 131* 81  BUN 18 16 10   CREATININE 0.62 0.70 0.63  CALCIUM 8.6*  --  8.3*   GFR: Estimated Creatinine Clearance: 98.8 mL/min (by C-G formula based on SCr of 0.63 mg/dL).  Liver Function Tests: Recent Labs  Lab 08/05/21 1924  AST 27  ALT 15  ALKPHOS 63  BILITOT 0.6  PROT 6.1*  ALBUMIN 3.6     Coagulation Profile: Recent Labs  Lab 08/05/21 1924  INR 1.0     HbA1C: Hgb A1c MFr Bld  Date/Time Value Ref Range Status  08/06/2021 04:33 PM 5.6 4.8 - 5.6 % Final    Comment:    (NOTE) Pre diabetes:          5.7%-6.4%  Diabetes:              >  6.4%  Glycemic control for   <7.0% adults with diabetes     CBG: Recent Labs  Lab 08/05/21 1920  GLUCAP 132*    Recent Results (from the past 240 hour(s))  Resp Panel by RT-PCR (Flu A&B, Covid) Nasopharyngeal Swab     Status: None   Collection Time: 08/05/21  7:26 PM   Specimen: Nasopharyngeal Swab; Nasopharyngeal(NP) swabs in vial transport medium  Result Value Ref Range Status   SARS Coronavirus 2 by RT PCR NEGATIVE NEGATIVE Final    Comment: (NOTE) SARS-CoV-2 target nucleic acids are NOT DETECTED.  The SARS-CoV-2 RNA is generally detectable in upper respiratory specimens during the acute phase of infection. The lowest concentration of  SARS-CoV-2 viral copies this assay can detect is 138 copies/mL. A negative result does not preclude SARS-Cov-2 infection and should not be used as the sole basis for treatment or other patient management decisions. A negative result may occur with  improper specimen collection/handling, submission of specimen other than nasopharyngeal swab, presence of viral mutation(s) within the areas targeted by this assay, and inadequate number of viral copies(<138 copies/mL). A negative result must be combined with clinical observations, patient history, and epidemiological information. The expected result is Negative.  Fact Sheet for Patients:  BloggerCourse.com  Fact Sheet for Healthcare Providers:  SeriousBroker.it  This test is no t yet approved or cleared by the [place] FDA and  has been authorized for detection and/or diagnosis of SARS-CoV-2 by FDA under an Emergency Use Authorization (EUA). This EUA will remain  in effect (meaning this test can be used) for the duration of the COVID-19 declaration under Section 564(b)(1) of the Act, 21 U.S.C.section 360bbb-3(b)(1), unless the authorization is terminated  or revoked sooner.       Influenza A by PCR NEGATIVE NEGATIVE Final   Influenza B by PCR NEGATIVE NEGATIVE Final    Comment: (NOTE) The Xpert Xpress SARS-CoV-2/FLU/RSV plus assay is intended as an aid in the diagnosis of influenza from Nasopharyngeal swab specimens and should not be used as a sole basis for treatment. Nasal washings and aspirates are unacceptable for Xpert Xpress SARS-CoV-2/FLU/RSV testing.  Fact Sheet for Patients: BloggerCourse.com  Fact Sheet for Healthcare Providers: SeriousBroker.it  This test is not yet approved or cleared by the [place] FDA and has been authorized for detection and/or diagnosis of SARS-CoV-2 by FDA under an Emergency Use  Authorization (EUA). This EUA will remain in effect (meaning this test can be used) for the duration of the COVID-19 declaration under Section 564(b)(1) of the Act, 21 U.S.C. section 360bbb-3(b)(1), unless the authorization is terminated or revoked.  Performed at [facility], [address] [place], [place] [address]      Scheduled Meds:  apixaban  10 mg Oral BID   Followed by   Melene Muller ON 08/14/2021] apixaban  5 mg Oral BID   mouth rinse  15 mL Mouth Rinse BID   metoprolol tartrate  2.5 mg Intravenous Q12H      LOS: 2 days   Lonia Blood, MD [facility] Office  [phone] Pager - Text Page per Loretha Stapler  If 7PM-7AM, please contact night-coverage per Amion 08/08/2021, 10:30 AM

## 2021-08-08 NOTE — Consult Note (Addendum)
  Micheal [name] is a 65 y.o. male admitted medically for 08/05/2021  6:53 PM for change in baseline mental status. He carries the psychiatric diagnoses of frontotemporal dementia and also anxiety, insomnia, mood disorder, MDD (unclear what predates dementia diagnosis)  and has a past medical history of  essential hypertension.Psychiatry was consulted for polypharmacy and management of agitation by Jamelle Rushing, MD.  Patient is seen and reassessed by this nurse practitioner.  He is observed to be eating and feeding himself, while he remains and bilateral wrist restraints and tele sitter.  Patient makes no attempt to make eye contact, speak with writer, and or engage.  While present in the room he is observed to be feeding himself chart broccoli, spaghetti, and a fruit cup using his fingers to feed despite having utensils within his reach.  His nursing staff are present in which she denies any disruptive behaviors, and or psychiatric concerns at this time.  She states patient was able to communicate that he wanted to eat, and that was the only word he has communicated all day.  She also reported that patient has not been as rigid and stiff this morning, and has seemed to loosen up.  She denies any immediate or acute concerns at this time.Patient appears to be improving at this time, it is unclear if this is due to medication adjustment, fluid hydration, and or alternative treatment modalities offered by hospitalist team.  Medication Adjustments:  -Recommend we continue current treatment plan, and adjust his home medications particularly 3 antipsychotic medications as he is receiving(1) Long acting injectable Haldol Decanoate, (2) atypical antipsychotics quetiapine and risperidone both being used for management of aggression and agitation.  -We will continue to hold quetiapine and risperdal.   Disposition recommendations:  -Plan is for patient to return to nursing facility, will refrain from adjusting  multiple medications at one time.  Patient appears to have been stable prior to this hospitalization on the medications listed above.  Facility has requested we not adjust his Haldol decanoate.  If patient continues to exhibit disruptive behavior, agitation, and aggression recommend discontinuation of 2 oral antipsychotics, and consider adding mood stabilizer or citalopram.  Facility may also consider maximizing his Haldol decanoate as he appears to be getting 50 mg every 28 days.  Delirium;  -Will order CK, per previous recommendation psychiatrist. -Continue delirium precautions. -Also recommend reevaluation by PT, to prevent deconditioning and deterioration.  Early mobilization during hospitalization does help decrease risk of delirium.  -Psychiatry will continue to follow until CK has resulted.

## 2021-08-08 NOTE — Discharge Instructions (Addendum)
Information on my medicine - ELIQUIS (apixaban)  This medication education was reviewed with me or my healthcare representative as part of my discharge preparation. The pharmacist that spoke with me during my hospital stay was: Quenton Fetter, Student-PharmD  Why was Eliquis prescribed for you? Eliquis was prescribed to treat blood clots that may have been found in the veins of your legs (deep vein thrombosis) or in your lungs (pulmonary embolism) and to reduce the risk of them occurring again.  What do You need to know about Eliquis? The starting dose is 10 mg (two 5 mg tablets) taken TWICE daily for the FIRST SEVEN (7) DAYS, then on 08/13/2021, the dose is reduced to ONE 5 mg tablet taken TWICE daily. Eliquis may be taken with or without food.   Try to take the dose about the same time in the morning and in the evening. If you have difficulty swallowing the tablet whole please discuss with your pharmacist how to take the medication safely.  Take Eliquis exactly as prescribed and DO NOT stop taking Eliquis without talking to the doctor who prescribed the medication.  Stopping may increase your risk of developing a new blood clot. Refill your prescription before you run out.  After discharge, you should have regular check-up appointments with your healthcare provider that is prescribing your Eliquis.    What do you do if you miss a dose? If a dose of ELIQUIS is not taken at the scheduled time, take it as soon as possible on the same day and twice-daily administration should be resumed. The dose should not be doubled to make up for a missed dose.  Important Safety Information A possible side effect of Eliquis is bleeding. You should call your healthcare provider right away if you experience any of the following: Bleeding from an injury or your nose that does not stop. Unusual colored urine (red or dark brown) or unusual colored stools (red or black). Unusual bruising for unknown  reasons. A serious fall or if you hit your head (even if there is no bleeding).  Some medicines may interact with Eliquis and might increase your risk of bleeding or clotting while on Eliquis. To help avoid this, consult your healthcare provider or pharmacist prior to using any new prescription or non-prescription medications, including herbals, vitamins, non-steroidal anti-inflammatory drugs (NSAIDs) and supplements.  This website has more information on Eliquis (apixaban): http://www.eliquis.com/eliquis/home

## 2021-08-08 NOTE — TOC Initial Note (Signed)
Transition of Care [facility]) - Initial/Assessment Note    Patient Details  Name: Micheal [name] MRN: [id number] Date of Birth: 09/14/1956  [facility] CM/SW Contact:    Ida Rogue, LCSW Phone Number: 08/08/2021, 10:29 AM  Clinical Narrative:    Spoke with Ms Zachery Dauer, Interior and spatial designer at [facility].  When I let her know we were making medication adjustments, she requested we not change his Haldol Decanoate injection which he gets q28 days, last administered last week of October.  At baseline he is ambulatory using no DME, is basically non-verbal, and responds best with eye contact and direct face to face proximity. They will accept him back when ambulatory. TOC will continue to follow during the course of hospitalization.          Expected Discharge Plan: Memory Care Barriers to Discharge: Continued Medical Work up   Patient Goals and CMS Choice        Expected Discharge Plan and Services Expected Discharge Plan: Memory Care   Discharge Planning Services: CM Consult   Living arrangements for the past 2 months: Assisted Living Facility                                      Prior Living Arrangements/Services Living arrangements for the past 2 months: Assisted Living Facility Lives with:: Facility Resident Patient language and need for interpreter reviewed:: Yes              Criminal Activity/Legal Involvement Pertinent to Current Situation/Hospitalization: No - Comment as needed  Activities of Daily Living Home Assistive Devices/Equipment: Blood pressure cuff, Grab bars around toilet, Grab bars in shower, Hand-held shower hose, Scales, Wheelchair ADL Screening (condition at time of admission) Patient's cognitive ability adequate to safely complete daily activities?: No Is the patient deaf or have difficulty hearing?: No Does the patient have difficulty seeing, even when wearing glasses/contacts?: No Does the patient have difficulty concentrating,  remembering, or making decisions?: Yes Patient able to express need for assistance with ADLs?: No (secondary to increased sluggishness) Does the patient have difficulty dressing or bathing?: Yes Independently performs ADLs?: No (secondary to wekaness and increasing sluggishness) Communication: Independent Dressing (OT): Dependent Is this a change from baseline?: Change from baseline, expected to last >3 days Grooming: Dependent Is this a change from baseline?: Change from baseline, expected to last >3 days Feeding: Dependent Is this a change from baseline?: Change from baseline, expected to last >3 days Bathing: Dependent Is this a change from baseline?: Change from baseline, expected to last >3 days Toileting: Dependent Is this a change from baseline?: Change from baseline, expected to last >3days In/Out Bed: Dependent Is this a change from baseline?: Change from baseline, expected to last >3 days Walks in Home: Dependent Is this a change from baseline?: Change from baseline, expected to last >3 days Does the patient have difficulty walking or climbing stairs?: Yes (secondary to wekaness and increased sluggishness) Weakness of Legs: Both Weakness of Arms/Hands: None  Permission Sought/Granted Permission sought to share information with : Facility Medical sales representative    Share Information with NAME: Gwendolyn Fill, administrator at [facility], [place]           Emotional Assessment       Orientation: : Oriented to Self Alcohol / Substance Use: Not Applicable Psych Involvement: No (comment)  Admission diagnosis:  Weakness [R53.1] Unstable gait [R26.81] AMS (altered mental  status) [R41.82] Patient Active Problem List   Diagnosis Date Noted   Hypokalemia    Unstable gait 08/06/2021   Encephalopathy acute 08/06/2021   AMS (altered mental status) 08/06/2021   Weakness    Frontotemporal dementia with behavioral disturbance (HCC) 03/01/2020   Dehydration with  hypernatremia 03/01/2020   Essential hypertension 03/01/2020   PCP:  Kirby Funk, MD Pharmacy:  No Pharmacies Listed    Social Determinants of Health (SDOH) Interventions    Readmission Risk Interventions No flowsheet data found.

## 2021-08-08 NOTE — Evaluation (Signed)
Occupational Therapy Evaluation Patient Details Name: Micheal [name] MRN: [id number] DOB: [birth year] Today's Date: 08/08/2021   History of Present Illness Micheal [name] is a 65 y.o. male with a PMH significant for frontotemporal dementia with behavioral disturbance, HTN.  They presented from nursing home to the ED on 08/05/2021 with change in baseline mental status . Admitted with encephalopathy and PE.   Clinical Impression   Patient is a 65 year old male who was previously living at ALF with memory care. Patient currently requires min A for self feeding tasks with patient preferring to eat with fingers. Patient would continue to benefit from skilled OT services at this time while admitted and after d/c to address noted deficits in order to improve overall safety and independence in ADLs.       Recommendations for follow up therapy are one component of a multi-disciplinary discharge planning process, led by the attending physician.  Recommendations may be updated based on patient status, additional functional criteria and insurance authorization.   Follow Up Recommendations  Skilled nursing-short term rehab (<3 hours/day)    Assistance Recommended at Discharge Frequent or constant Supervision/Assistance  Functional Status Assessment  Patient has had a recent decline in their functional status and/or demonstrates limited ability to make significant improvements in function in a reasonable and predictable amount of time  Equipment Recommendations  None recommended by OT    Recommendations for Other Services       Precautions / Restrictions Precautions Precautions: Fall Restrictions Weight Bearing Restrictions: No      Mobility Bed Mobility               General bed mobility comments: patient tolerated chair position in bed for self feeding tasks    Transfers                   General transfer comment: unable.      Balance                                            ADL either performed or assessed with clinical judgement   ADL                                               Vision   Additional Comments: unable to test formally with cog deficits     Perception     Praxis      Pertinent Vitals/Pain Pain Assessment: Faces Pain Score: 0-No pain Pain Intervention(s): Monitored during session     Hand Dominance Left   Extremity/Trunk Assessment Upper Extremity Assessment Upper Extremity Assessment: RUE deficits/detail;LUE deficits/detail RUE Deficits / Details: limited ROM with patient unable to follow MMT RUE Coordination: decreased fine motor;decreased gross motor LUE Deficits / Details: hard to asses with patient noted to use this UE with self feeding with ability to bring from plate to mouth lifhing shoulder into functional flexion and ABduction. LUE Coordination: decreased fine motor;decreased gross motor   Lower Extremity Assessment Lower Extremity Assessment: Defer to PT evaluation       Communication Communication Communication: Expressive difficulties   Cognition Arousal/Alertness: Awake/alert Behavior During Therapy: Flat affect  General Comments: nonverbal except  said a few words non sensible.     General Comments       Exercises     Shoulder Instructions      Home Living Family/patient expects to be discharged to:: Assisted living                                 Additional Comments: Per notes, pt from ALF.      Prior Functioning/Environment               Mobility Comments: per chart, ambulatory at baseline, demntia          OT Problem List: Impaired balance (sitting and/or standing);Decreased range of motion;Decreased activity tolerance;Decreased cognition      OT Treatment/Interventions: Self-care/ADL training;Therapeutic exercise;Neuromuscular education;DME and/or AE instruction;Energy  conservation;Balance training;Therapeutic activities;Patient/family education    OT Goals(Current goals can be found in the care plan section) Acute Rehab OT Goals Patient Stated Goal: none stated OT Goal Formulation: Patient unable to participate in goal setting Time For Goal Achievement: 08/22/21 Potential to Achieve Goals: Fair  OT Frequency: Min 2X/week   Barriers to D/C:            Co-evaluation              AM-PAC OT "6 Clicks" Daily Activity     Outcome Measure Help from another person eating meals?: A Little (prefers finger foods) Help from another person taking care of personal grooming?: A Lot Help from another person toileting, which includes using toliet, bedpan, or urinal?: Total Help from another person bathing (including washing, rinsing, drying)?: Total Help from another person to put on and taking off regular upper body clothing?: Total Help from another person to put on and taking off regular lower body clothing?: Total 6 Click Score: 9   End of Session Nurse Communication: Precautions  Activity Tolerance: Patient tolerated treatment well Patient left: in bed;with call bell/phone within reach;with bed alarm set;with nursing/sitter in room  OT Visit Diagnosis: Unsteadiness on feet (R26.81);Other abnormalities of gait and mobility (R26.89);Muscle weakness (generalized) (M62.81)                Time: 7017-7939 OT Time Calculation (min): 31 min Charges:  OT General Charges $OT Visit: 1 Visit OT Evaluation $OT Eval Low Complexity: 1 Low OT Treatments $Self Care/Home Management : 8-22 mins  Micheal [name] OTR/L, MS Acute Rehabilitation Department Office# [phone] Pager# [phone]   Micheal [name] Micheal [name] 08/08/2021, 12:53 PM

## 2021-08-09 DIAGNOSIS — R2681 Unsteadiness on feet: Secondary | ICD-10-CM | POA: Diagnosis not present

## 2021-08-09 MED ORDER — SODIUM CHLORIDE 0.9 % IV SOLN: INTRAVENOUS | Status: DC

## 2021-08-09 NOTE — Progress Notes (Signed)
Micheal [name]  HQP:[id number] DOB: 1956/03/12 DOA: 08/05/2021 PCP: Kirby Funk, MD    Brief Narrative:  [phone] with a history of frontotemporal dementia and HTN who presented to the ED from his nursing home 10/30 with altered mental status.  Consultants:  Neurology Psychiatry  Code Status: NO CODE BLUE  Antimicrobials:  None  DVT prophylaxis: Eliquis  Subjective: Afebrile.  Vital signs stable.  Sedate.  No evidence of discomfort or distress.  No family present at time of exam.  Assessment & Plan:  Altered mental status CT head unrevealing - brain MRI without acute findings -TTE with bubble study negative -UA and CXR without evidence of acute infection -vitals stable - ABG unrevealing - have minimized sedating medications with assistance from Psychiatry  Modestly elevated CK CK ordered by psychiatry service due to nurses reporting transient rigidity -CK is abnormally elevated but not markedly so -this may simply be related to his essentially bedridden status -gently hydrate and follow-up in a.m.  Acute pulmonary embolism - acute right popliteal DVT CTa noted right upper lobe pulmonary emboli with a small clot burden - transitioned from heparin drip to Eliquis which he appears to be tolerating well thus far  Frontotemporal dementia Episodes of agitation and intermittent sedation at baseline  HTN Does not appear to require chronic medical therapy  Hypokalemia Resolved   Family Communication: No family present at time of exam Status is: Inpatient - awaiting SNF placement for rehab stay as recommended by PT   Objective: Blood pressure (!) 144/81, pulse 87, temperature 98.5 F (36.9 C), temperature source Oral, resp. rate 18, height 6' (1.829 m), weight 75.9 kg, SpO2 94 %.  Intake/Output Summary (Last 24 hours) at 08/09/2021 0912 Last data filed at 08/08/2021 1243 Gross per 24 hour  Intake 360 ml  Output --  Net 360 ml    Filed Weights   08/06/21 0526 08/07/21  0946  Weight: 81.6 kg 75.9 kg    Examination: General: No acute respiratory distress Lungs: Clear to auscultation bilaterally  Cardiovascular: Regular rate and rhythm without murmur  Abdomen: Nontender, nondistended, soft, bowel sounds positive Extremities: No significant edema bilateral lower extremities  CBC: Recent Labs  Lab 08/05/21 1924 08/05/21 1929 08/06/21 1633 08/07/21 0425 08/08/21 0509  WBC 7.4  --  7.9 7.4 6.5  NEUTROABS 5.4  --   --   --   --   HGB 12.7*   < > 12.8* 12.2* 13.1  HCT 38.0*   < > 39.1 36.5* 39.6  MCV 98.2  --  99.0 97.1 96.6  PLT 148*  --  158 148* 154   < > = values in this interval not displayed.    Basic Metabolic Panel: Recent Labs  Lab 08/05/21 1924 08/05/21 1929 08/07/21 0425  NA 140 140 140  K 3.3* 3.3* 3.6  CL 102 98 106  CO2 30  --  26  GLUCOSE 135* 131* 81  BUN 18 16 10   CREATININE 0.62 0.70 0.63  CALCIUM 8.6*  --  8.3*    GFR: Estimated Creatinine Clearance: 98.8 mL/min (by C-G formula based on SCr of 0.63 mg/dL).  Liver Function Tests: Recent Labs  Lab 08/05/21 1924  AST 27  ALT 15  ALKPHOS 63  BILITOT 0.6  PROT 6.1*  ALBUMIN 3.6      Coagulation Profile: Recent Labs  Lab 08/05/21 1924  INR 1.0      HbA1C: Hgb A1c MFr Bld  Date/Time Value Ref Range Status  08/06/2021 04:33 PM  5.6 4.8 - 5.6 % Final    Comment:    (NOTE) Pre diabetes:          5.7%-6.4%  Diabetes:              >6.4%  Glycemic control for   <7.0% adults with diabetes     CBG: Recent Labs  Lab 08/05/21 1920  GLUCAP 132*     Recent Results (from the past 240 hour(s))  Resp Panel by RT-PCR (Flu A&B, Covid) Nasopharyngeal Swab     Status: None   Collection Time: 08/05/21  7:26 PM   Specimen: Nasopharyngeal Swab; Nasopharyngeal(NP) swabs in vial transport medium  Result Value Ref Range Status   SARS Coronavirus 2 by RT PCR NEGATIVE NEGATIVE Final    Comment: (NOTE) SARS-CoV-2 target nucleic acids are NOT DETECTED.  The  SARS-CoV-2 RNA is generally detectable in upper respiratory specimens during the acute phase of infection. The lowest concentration of SARS-CoV-2 viral copies this assay can detect is 138 copies/mL. A negative result does not preclude SARS-Cov-2 infection and should not be used as the sole basis for treatment or other patient management decisions. A negative result may occur with  improper specimen collection/handling, submission of specimen other than nasopharyngeal swab, presence of viral mutation(s) within the areas targeted by this assay, and inadequate number of viral copies(<138 copies/mL). A negative result must be combined with clinical observations, patient history, and epidemiological information. The expected result is Negative.  Fact Sheet for Patients:  BloggerCourse.com  Fact Sheet for Healthcare Providers:  SeriousBroker.it  This test is no t yet approved or cleared by the [place] FDA and  has been authorized for detection and/or diagnosis of SARS-CoV-2 by FDA under an Emergency Use Authorization (EUA). This EUA will remain  in effect (meaning this test can be used) for the duration of the COVID-19 declaration under Section 564(b)(1) of the Act, 21 U.S.C.section 360bbb-3(b)(1), unless the authorization is terminated  or revoked sooner.       Influenza A by PCR NEGATIVE NEGATIVE Final   Influenza B by PCR NEGATIVE NEGATIVE Final    Comment: (NOTE) The Xpert Xpress SARS-CoV-2/FLU/RSV plus assay is intended as an aid in the diagnosis of influenza from Nasopharyngeal swab specimens and should not be used as a sole basis for treatment. Nasal washings and aspirates are unacceptable for Xpert Xpress SARS-CoV-2/FLU/RSV testing.  Fact Sheet for Patients: BloggerCourse.com  Fact Sheet for Healthcare Providers: SeriousBroker.it  This test is not yet approved or  cleared by the [place] FDA and has been authorized for detection and/or diagnosis of SARS-CoV-2 by FDA under an Emergency Use Authorization (EUA). This EUA will remain in effect (meaning this test can be used) for the duration of the COVID-19 declaration under Section 564(b)(1) of the Act, 21 U.S.C. section 360bbb-3(b)(1), unless the authorization is terminated or revoked.  Performed at [facility], [address] [place], [place] [address]       Scheduled Meds:  apixaban  10 mg Oral BID   Followed by   Melene Muller ON 08/14/2021] apixaban  5 mg Oral BID   mouth rinse  15 mL Mouth Rinse BID   metoprolol tartrate  2.5 mg Intravenous Q12H      LOS: 3 days   Lonia Blood, MD [facility] Office  [phone] Pager - Text Page per Loretha Stapler  If 7PM-7AM, please contact night-coverage per Amion 08/09/2021, 9:12 AM

## 2021-08-09 NOTE — Progress Notes (Signed)
Physical Therapy Treatment Patient Details Name: Micheal [name] MRN: [id number] DOB: 03-08-1956 Today's Date: 08/09/2021   History of Present Illness Micheal [name] is a 65 y.o. male with a PMH significant for frontotemporal dementia with behavioral disturbance, HTN.  They presented from nursing home to the ED on 08/05/2021 with change in baseline mental status . Admitted with encephalopathy and PE.    PT Comments    Patient unable to participate, pulling on mitts when restraints released. + 2 total assist to move to sitting  and attempt to stand. Unable to clear bed. Patient more restless, assisted back into bed and restraints replaced.    Recommendations for follow up therapy are one component of a multi-disciplinary discharge planning process, led by the attending physician.  Recommendations may be updated based on patient status, additional functional criteria and insurance authorization.  Follow Up Recommendations  Long-term institutional care without follow-up therapy (unless patient demonstrates ability to participate)     Assistance Recommended at Discharge Frequent or constant Supervision/Assistance  Equipment Recommendations  None recommended by PT    Recommendations for Other Services       Precautions / Restrictions Precautions Precautions: Fall Precaution Comments: agitated     Mobility  Bed Mobility Overal bed mobility: Needs Assistance Bed Mobility: Supine to Sit;Sit to Supine     Supine to sit: +2 for safety/equipment;+2 for physical assistance;HOB elevated;Total assist Sit to supine: HOB elevated;+2 for safety/equipment;+2 for physical assistance;Total assist   General bed mobility comments: nonviolent retraints removed to assist patient to sit on bed edge, use of pad and total assist of 2.. Patient began pulling off mitts.  Total assist to return to supine. Patient rirgid in trunk and legs and arms.    Transfers Overall transfer level: Needs  assistance Equipment used: 2 person hand held assist Transfers: Sit to/from Stand Sit to Stand: Total assist;+2 physical assistance;+2 safety/equipment           General transfer comment: attempted standing, patient not bearing weight to push to stand. Barely cleared buttocks from bed.    Ambulation/Gait                 Stairs             Wheelchair Mobility    Modified Rankin (Stroke Patients Only)       Balance Overall balance assessment: Needs assistance Sitting-balance support: Feet supported;No upper extremity supported Sitting balance-Leahy Scale: Fair Sitting balance - Comments: once in sitting holds self at midline                                    Cognition Arousal/Alertness: Awake/alert Behavior During Therapy: Flat affect Overall Cognitive Status: No family/caregiver present to determine baseline cognitive functioning                                 General Comments: nonverbal , glaring, non focussing        Exercises      General Comments        Pertinent Vitals/Pain Breathing: normal Negative Vocalization: none Facial Expression: smiling or inexpressive Body Language: tense, distressed pacing, fidgeting Consolability: no need to console PAINAD Score: 1    Home Living                          Prior  Function            PT Goals (current goals can now be found in the care plan section) Acute Rehab PT Goals Potential to Achieve Goals: Poor Progress towards PT goals: Not progressing toward goals - comment (patient unable to  follow any instructions)    Frequency    Min 1X/week      PT Plan Current plan remains appropriate;Frequency needs to be updated    Co-evaluation              AM-PAC PT "6 Clicks" Mobility   Outcome Measure  Help needed turning from your back to your side while in a flat bed without using bedrails?: Total Help needed moving from lying on your back  to sitting on the side of a flat bed without using bedrails?: Total Help needed moving to and from a bed to a chair (including a wheelchair)?: Total Help needed standing up from a chair using your arms (e.g., wheelchair or bedside chair)?: Total Help needed to walk in hospital room?: Total Help needed climbing 3-5 steps with a railing? : Total 6 Click Score: 6    End of Session   Activity Tolerance: Patient tolerated treatment well Patient left: in bed;with call bell/phone within reach;with bed alarm set;with restraints reapplied Nurse Communication: Mobility status       Time: 8502-7741 PT Time Calculation (min) (ACUTE ONLY): 10 min  Charges:  $Therapeutic Activity: 8-22 mins                     Blanchard Kelch PT Acute Rehabilitation Services Pager [phone] Office [phone]    Rada Hay 08/09/2021, 5:02 PM

## 2021-08-09 NOTE — Consult Note (Signed)
Micheal [name] is a 65 y.o. male admitted medically for 08/05/2021  6:53 PM for change in baseline mental status. He carries the psychiatric diagnoses of frontotemporal dementia and also anxiety, insomnia, mood disorder, MDD (unclear what predates dementia diagnosis)  and has a past medical history of  essential hypertension.Psychiatry was consulted for polypharmacy and management of agitation by Jamelle Rushing, MD.   Patient was seen and assessed yesterday by this nurse practitioner. CK level was ordered which shows to be 1097.  -At this time recommend we continue holding his psychotropic medications to include Risperidone and Quetiapine.  -Patient may benefit from ongoing IV fluid hydration and physical therapy. -Psychiatry will continue to follow at this time.

## 2021-08-10 DIAGNOSIS — R2681 Unsteadiness on feet: Secondary | ICD-10-CM | POA: Diagnosis not present

## 2021-08-10 LAB — BASIC METABOLIC PANEL
Anion gap: 10 (ref 5–15)
BUN: 22 mg/dL (ref 8–23)
CO2: 26 mmol/L (ref 22–32)
Calcium: 8.4 mg/dL — ABNORMAL LOW (ref 8.9–10.3)
Chloride: 107 mmol/L (ref 98–111)
Creatinine, Ser: 0.6 mg/dL — ABNORMAL LOW (ref 0.61–1.24)
GFR, Estimated: >60 mL/min (ref >60)
Glucose, Bld: 101 mg/dL — ABNORMAL HIGH (ref 70–99)
Potassium: 3.7 mmol/L (ref 3.5–5.1)
Sodium: 143 mmol/L (ref 135–145)

## 2021-08-10 LAB — CK: Total CK: 785 U/L — ABNORMAL HIGH (ref 49–397)

## 2021-08-10 MED ORDER — KCL IN DEXTROSE-NACL 20-5-0.9 MEQ/L-%-% IV SOLN
INTRAVENOUS | Status: DC
  Filled 2021-08-10 (×3): qty 1000

## 2021-08-10 MED ORDER — BENZTROPINE MESYLATE 1 MG/ML IJ SOLN
1.0000 mg | Freq: Once | INTRAMUSCULAR | Status: AC
  Administered 2021-08-10: 1 mg via INTRAMUSCULAR
  Filled 2021-08-10: qty 1

## 2021-08-10 NOTE — Progress Notes (Signed)
SLP Cancellation Note  Patient Details Name: Micheal [name] MRN: [id number] DOB: [birth year]   Cancelled treatment:    Pt did not respond to efforts to facilitate swallowing - kept eyes closed, did not accept water/POs.  Will continue efforts.  Micheal [name] L. Samson Frederic, MA CCC/SLP Acute Rehabilitation Services Office number [phone] Pager [phone]        Micheal [name] 08/10/2021, 12:30 PM

## 2021-08-10 NOTE — Consult Note (Signed)
Pt familiar to psych consult service; please see first note for full eval  I went and saw Micheal [name] who was not responsive to voice, did seem to respond to physical exam. Marked rigidity in all extremities. Had opted not to attempt cogentin d/t deliriogenic medication and hold PO antipsychotics (pt with good kidney function, elevated CK but not extremely elevated, etc) however this has not resulted in a decrease in rigidity and pt received haldol LAI within last 4 weeks. Opted for IM cogentin to see if this affects rigidity and planned to assess ~30 min after med administration; pt had not received medication at time of writing this note.   Plan If rigidity improves after administration of IM cogentin, would start PO 1 mg BID and recommend no further LAI haloperidol.

## 2021-08-10 NOTE — TOC Progression Note (Addendum)
Transition of Care [facility]) - Progression Note    Patient Details  Name: Micheal [name] MRN: [id number] Date of Birth: [birth year]  Transition of Care [facility]) CM/SW Contact  Ida Rogue, [place] Phone Number: 08/10/2021, 10:54 AM  Clinical Narrative:   Called Ms Micheal [name] at [facility], left a message asking her to call so we can discuss [name] dispositional plan. TOC will continue to follow during the course of hospitalization.  Addendum:  Ms Micheal [name] called back.  She and resident director will come Monday AM to evaluate [name] to see if it is possible for him to return. They are hoping they can receive him back. Currently his attorney is his POA as wife died suddenly a week ago or so.      Expected Discharge Plan: Memory Care Barriers to Discharge: Continued Medical Work up  Expected Discharge Plan and Services Expected Discharge Plan: Memory Care   Discharge Planning Services: CM Consult   Living arrangements for the past 2 months: Assisted Living Facility                                       Social Determinants of Health (SDOH) Interventions    Readmission Risk Interventions No flowsheet data found.

## 2021-08-10 NOTE — Progress Notes (Signed)
Pt refused to take Eliquis. Hospitalist Bruna Potter, NP) was notified

## 2021-08-10 NOTE — Progress Notes (Signed)
Patient attempted to be fed both breakfast and lunch today. Patient noted to be lethargic. Will continue to reassess.

## 2021-08-10 NOTE — Progress Notes (Signed)
Micheal [name]  TLX:[id number] DOB: November 12, 1955 DOA: 08/05/2021 PCP: Kirby Funk, MD    Brief Narrative:  [phone] with a history of frontotemporal dementia and HTN who presented to the ED from his nursing home 10/30 with altered mental status.  Consultants:  Neurology Psychiatry  Code Status: NO CODE BLUE  Antimicrobials:  None  DVT prophylaxis: Eliquis  Subjective: Afebrile.  Vital signs stable.  Saturation 94% on room air.  CK improved with gentle hydration.  Remains minimally interactive.  Opens eyes and look at examiner.  Will answer some very simple questions with one-word responses but oftentimes these are not consistent with a question.  Does not appear to be an extremis.  Assessment & Plan:  Altered mental status CT head unrevealing - brain MRI without acute findings -TTE with bubble study negative - UA and CXR without evidence of acute infection -vitals stable - ABG unrevealing - have minimized sedating medications with assistance from Psychiatry  Modestly elevated CK CK ordered by Psychiatry service due to nurses reporting some rigidity -CK is abnormally elevated but not markedly so - improved with gentle hydration  Acute pulmonary embolism - acute right popliteal DVT CTa noted right upper lobe pulmonary emboli with a small clot burden - transitioned from heparin drip to Eliquis   Frontotemporal dementia Episodes of agitation and intermittent sedation at baseline - medication adjustment per Psychiatry   HTN Does not appear to require chronic medical therapy  Hypokalemia Resolved   Family Communication: No family present at time of exam Status is: Inpatient - awaiting SNF placement for long-term care   Objective: Blood pressure (!) 149/81, pulse 84, temperature (!) 97.5 F (36.4 C), temperature source Axillary, resp. rate 20, height 6' (1.829 m), weight 75.9 kg, SpO2 94 %.  Intake/Output Summary (Last 24 hours) at 08/10/2021 0852 Last data filed at  08/10/2021 0546 Gross per 24 hour  Intake 1756.35 ml  Output --  Net 1756.35 ml    Filed Weights   08/06/21 0526 08/07/21 0946  Weight: 81.6 kg 75.9 kg    Examination: General: No acute respiratory distress Lungs: Clear to auscultation bilaterally -no wheezing Cardiovascular: Regular rate and rhythm without murmur  Abdomen: Nontender, nondistended, soft, bowel sounds positive Extremities: No significant edema bilateral lower extremities  CBC: Recent Labs  Lab 08/05/21 1924 08/05/21 1929 08/06/21 1633 08/07/21 0425 08/08/21 0509  WBC 7.4  --  7.9 7.4 6.5  NEUTROABS 5.4  --   --   --   --   HGB 12.7*   < > 12.8* 12.2* 13.1  HCT 38.0*   < > 39.1 36.5* 39.6  MCV 98.2  --  99.0 97.1 96.6  PLT 148*  --  158 148* 154   < > = values in this interval not displayed.    Basic Metabolic Panel: Recent Labs  Lab 08/05/21 1924 08/05/21 1929 08/07/21 0425 08/10/21 0457  NA 140 140 140 143  K 3.3* 3.3* 3.6 3.7  CL 102 98 106 107  CO2 30  --  26 26  GLUCOSE 135* 131* 81 101*  BUN 18 16 10 22   CREATININE 0.62 0.70 0.63 0.60*  CALCIUM 8.6*  --  8.3* 8.4*    GFR: Estimated Creatinine Clearance: 98.8 mL/min (A) (by C-G formula based on SCr of 0.6 mg/dL (L)).  Liver Function Tests: Recent Labs  Lab 08/05/21 1924  AST 27  ALT 15  ALKPHOS 63  BILITOT 0.6  PROT 6.1*  ALBUMIN 3.6  Coagulation Profile: Recent Labs  Lab 08/05/21 1924  INR 1.0      HbA1C: Hgb A1c MFr Bld  Date/Time Value Ref Range Status  08/06/2021 04:33 PM 5.6 4.8 - 5.6 % Final    Comment:    (NOTE) Pre diabetes:          5.7%-6.4%  Diabetes:              >6.4%  Glycemic control for   <7.0% adults with diabetes     CBG: Recent Labs  Lab 08/05/21 1920  GLUCAP 132*     Recent Results (from the past 240 hour(s))  Resp Panel by RT-PCR (Flu A&B, Covid) Nasopharyngeal Swab     Status: None   Collection Time: 08/05/21  7:26 PM   Specimen: Nasopharyngeal Swab; Nasopharyngeal(NP)  swabs in vial transport medium  Result Value Ref Range Status   SARS Coronavirus 2 by RT PCR NEGATIVE NEGATIVE Final    Comment: (NOTE) SARS-CoV-2 target nucleic acids are NOT DETECTED.  The SARS-CoV-2 RNA is generally detectable in upper respiratory specimens during the acute phase of infection. The lowest concentration of SARS-CoV-2 viral copies this assay can detect is 138 copies/mL. A negative result does not preclude SARS-Cov-2 infection and should not be used as the sole basis for treatment or other patient management decisions. A negative result may occur with  improper specimen collection/handling, submission of specimen other than nasopharyngeal swab, presence of viral mutation(s) within the areas targeted by this assay, and inadequate number of viral copies(<138 copies/mL). A negative result must be combined with clinical observations, patient history, and epidemiological information. The expected result is Negative.  Fact Sheet for Patients:  BloggerCourse.com  Fact Sheet for Healthcare Providers:  SeriousBroker.it  This test is no t yet approved or cleared by the [place] FDA and  has been authorized for detection and/or diagnosis of SARS-CoV-2 by FDA under an Emergency Use Authorization (EUA). This EUA will remain  in effect (meaning this test can be used) for the duration of the COVID-19 declaration under Section 564(b)(1) of the Act, 21 U.S.C.section 360bbb-3(b)(1), unless the authorization is terminated  or revoked sooner.       Influenza A by PCR NEGATIVE NEGATIVE Final   Influenza B by PCR NEGATIVE NEGATIVE Final    Comment: (NOTE) The Xpert Xpress SARS-CoV-2/FLU/RSV plus assay is intended as an aid in the diagnosis of influenza from Nasopharyngeal swab specimens and should not be used as a sole basis for treatment. Nasal washings and aspirates are unacceptable for Xpert Xpress  SARS-CoV-2/FLU/RSV testing.  Fact Sheet for Patients: BloggerCourse.com  Fact Sheet for Healthcare Providers: SeriousBroker.it  This test is not yet approved or cleared by the [place] FDA and has been authorized for detection and/or diagnosis of SARS-CoV-2 by FDA under an Emergency Use Authorization (EUA). This EUA will remain in effect (meaning this test can be used) for the duration of the COVID-19 declaration under Section 564(b)(1) of the Act, 21 U.S.C. section 360bbb-3(b)(1), unless the authorization is terminated or revoked.  Performed at [facility], [address], [place], [place] [address]       Scheduled Meds:  apixaban  10 mg Oral BID   Followed by   Melene Muller ON 08/14/2021] apixaban  5 mg Oral BID   mouth rinse  15 mL Mouth Rinse BID   metoprolol tartrate  2.5 mg Intravenous Q12H      LOS: 4 days   Lonia Blood, MD [facility] Office  [phone] Pager - Text Page per Loretha Stapler  If 7PM-7AM, please contact night-coverage per Amion 08/10/2021, 8:52 AM

## 2021-08-11 LAB — CBC
HCT: 37.7 % — ABNORMAL LOW (ref 39.0–52.0)
Hemoglobin: 12.7 g/dL — ABNORMAL LOW (ref 13.0–17.0)
MCH: 32.6 pg (ref 26.0–34.0)
MCHC: 33.7 g/dL (ref 30.0–36.0)
MCV: 96.9 fL (ref 80.0–100.0)
Platelets: 157 10*3/uL (ref 150–400)
RBC: 3.89 MIL/uL — ABNORMAL LOW (ref 4.22–5.81)
RDW: 13.1 % (ref 11.5–15.5)
WBC: 10.5 10*3/uL (ref 4.0–10.5)
nRBC: 0 % (ref 0.0–0.2)

## 2021-08-11 LAB — RPR: RPR Ser Ql: NONREACTIVE

## 2021-08-11 LAB — COMPREHENSIVE METABOLIC PANEL
ALT: 21 U/L (ref 0–44)
AST: 29 U/L (ref 15–41)
Albumin: 3.2 g/dL — ABNORMAL LOW (ref 3.5–5.0)
Alkaline Phosphatase: 50 U/L (ref 38–126)
Anion gap: 8 (ref 5–15)
BUN: 19 mg/dL (ref 8–23)
CO2: 24 mmol/L (ref 22–32)
Calcium: 8.3 mg/dL — ABNORMAL LOW (ref 8.9–10.3)
Chloride: 109 mmol/L (ref 98–111)
Creatinine, Ser: 0.54 mg/dL — ABNORMAL LOW (ref 0.61–1.24)
GFR, Estimated: >60 mL/min (ref >60)
Glucose, Bld: 136 mg/dL — ABNORMAL HIGH (ref 70–99)
Potassium: 3.6 mmol/L (ref 3.5–5.1)
Sodium: 141 mmol/L (ref 135–145)
Total Bilirubin: 1.2 mg/dL (ref 0.3–1.2)
Total Protein: 5.9 g/dL — ABNORMAL LOW (ref 6.5–8.1)

## 2021-08-11 LAB — CK: Total CK: 438 U/L — ABNORMAL HIGH (ref 49–397)

## 2021-08-11 LAB — AMMONIA: Ammonia: 19 umol/L (ref 9–35)

## 2021-08-11 LAB — VITAMIN B12: Vitamin B-12: 317 pg/mL (ref 180–914)

## 2021-08-11 LAB — FOLATE: Folate: 8.8 ng/mL (ref >5.9)

## 2021-08-11 MED ORDER — BENZTROPINE MESYLATE 1 MG/ML IJ SOLN
1.0000 mg | Freq: Once | INTRAMUSCULAR | Status: AC
  Administered 2021-08-11: 1 mg via INTRAMUSCULAR
  Filled 2021-08-11: qty 1

## 2021-08-11 MED ORDER — CYANOCOBALAMIN 1000 MCG/ML IJ SOLN
1000.0000 ug | Freq: Every day | INTRAMUSCULAR | Status: AC
  Administered 2021-08-11 – 2021-08-13 (×3): 1000 ug via SUBCUTANEOUS
  Filled 2021-08-11 (×3): qty 1

## 2021-08-11 MED ORDER — ENOXAPARIN SODIUM 80 MG/0.8ML IJ SOSY
80.0000 mg | PREFILLED_SYRINGE | Freq: Two times a day (BID) | INTRAMUSCULAR | Status: DC
  Administered 2021-08-11 – 2021-08-13 (×5): 80 mg via SUBCUTANEOUS
  Filled 2021-08-11 (×5): qty 0.8

## 2021-08-11 NOTE — Progress Notes (Signed)
Pt is injury free, afebrile, alert, and confused X 4. Vital signs were within the baseline during this shift. No s/s or indications of pain. Pt remains on non-violent restraints and video monitoring. He refused to take his oral medications, the hospitalist on coverage was notified. No s/s of chest pain, SOB, nausea, bleeding, infection or acute changes during this shift. We will continue to monitor and work toward achieving the care plan goals.

## 2021-08-11 NOTE — Progress Notes (Signed)
Patient continues to be lethargic and unable to take food or medication orally.

## 2021-08-11 NOTE — Consult Note (Signed)
Pt familiar to psych consult service; please see first note for full eval   Patient seen today face to face, appear lethargic, not responsive to voice but did seem to respond to physical exam. All extremities remains rigidity in all extremities despite receiving 1 mg of Cogentin yesterday. CK appears to be trending down from 785 yesterday to 438 today.   Plan -Will repeat 1 mg of IM cogentin today, would start PO 1 mg BID if rigidity improves -Discontinue Long acting injectable-Haloperidol.  -Psychiatric service will continue to follow  Thedore Mins, MD

## 2021-08-11 NOTE — Progress Notes (Signed)
Micheal [name]  ZJI:[id number] DOB: [birth year] DOA: 08/05/2021 PCP: Kirby Funk, MD    Brief Narrative:  [phone] with a history of frontotemporal dementia who presented to the ED from his nursing home 10/30 with altered mental status.  Consultants:  Neurology Psychiatry  Code Status: NO CODE BLUE  Antimicrobials:  None  DVT prophylaxis: Eliquis  Subjective: Psychiatry continues to follow and has not appreciated a significant change in his rigidity with simple discontinuation of his oral antipsychotics.  Patient was given a trial dose of IM Cogentin yesterday.  Patient refused to take oral medications this morning.  CK continues to improve.  B12 is noted to be suboptimal at 317.  No significant change at time of exam today.  Remains essentially unresponsive to my exam.  Does not appear to be uncomfortable.  Assessment & Plan:  Altered mental status CT head unrevealing - brain MRI without acute findings -TTE with bubble study negative - UA and CXR without evidence of acute infection -vitals stable - ABG unrevealing - have minimized sedating medications with assistance from Psychiatry  Rigidity Treatment being supervised by psychiatry -initial course of action was to discontinue oral antipsychotics but this has had no significant effect -trial of Cogentin initiated 11/4 as patient is on long-acting injected Haldol  Modestly elevated CK CK is abnormally elevated but not markedly so - improving steadily with gentle hydration  Acute pulmonary embolism - acute right popliteal DVT CTa noted right upper lobe pulmonary emboli with a small clot burden - transitioned from heparin drip to Eliquis   Frontotemporal dementia Episodes of agitation and intermittent sedation at baseline - medication adjustment per Psychiatry   Hypokalemia Resolved  Modest B12 deficiency Supplement   Family Communication: No family present at time of exam Status is: Inpatient - awaiting SNF placement for  long-term care   Objective: Blood pressure (!) 152/87, pulse 92, temperature 99.4 F (37.4 C), temperature source Oral, resp. rate 20, height 6' (1.829 m), weight 75.9 kg, SpO2 97 %.  Intake/Output Summary (Last 24 hours) at 08/11/2021 0905 Last data filed at 08/11/2021 0100 Gross per 24 hour  Intake 0 ml  Output 1020 ml  Net -1020 ml    Filed Weights   08/06/21 0526 08/07/21 0946  Weight: 81.6 kg 75.9 kg    Examination: General: No acute respiratory distress Lungs: Clear to auscultation bilaterally Cardiovascular: Regular rate and rhythm  Abdomen: Nondistended, soft, bowel sounds positive Extremities: No significant edema bilateral lower extremities  CBC: Recent Labs  Lab 08/05/21 1924 08/05/21 1929 08/07/21 0425 08/08/21 0509 08/11/21 0538  WBC 7.4   < > 7.4 6.5 10.5  NEUTROABS 5.4  --   --   --   --   HGB 12.7*   < > 12.2* 13.1 12.7*  HCT 38.0*   < > 36.5* 39.6 37.7*  MCV 98.2   < > 97.1 96.6 96.9  PLT 148*   < > 148* 154 157   < > = values in this interval not displayed.    Basic Metabolic Panel: Recent Labs  Lab 08/07/21 0425 08/10/21 0457 08/11/21 0538  NA 140 143 141  K 3.6 3.7 3.6  CL 106 107 109  CO2 26 26 24   GLUCOSE 81 101* 136*  BUN 10 22 19   CREATININE 0.63 0.60* 0.54*  CALCIUM 8.3* 8.4* 8.3*    GFR: Estimated Creatinine Clearance: 98.8 mL/min (A) (by C-G formula based on SCr of 0.54 mg/dL (L)).  Liver Function Tests: Recent Labs  Lab  08/05/21 1924 08/11/21 0538  AST 27 29  ALT 15 21  ALKPHOS 63 50  BILITOT 0.6 1.2  PROT 6.1* 5.9*  ALBUMIN 3.6 3.2*      Coagulation Profile: Recent Labs  Lab 08/05/21 1924  INR 1.0      HbA1C: Hgb A1c MFr Bld  Date/Time Value Ref Range Status  08/06/2021 04:33 PM 5.6 4.8 - 5.6 % Final    Comment:    (NOTE) Pre diabetes:          5.7%-6.4%  Diabetes:              >6.4%  Glycemic control for   <7.0% adults with diabetes     CBG: Recent Labs  Lab 08/05/21 1920  GLUCAP 132*      Recent Results (from the past 240 hour(s))  Resp Panel by RT-PCR (Flu A&B, Covid) Nasopharyngeal Swab     Status: None   Collection Time: 08/05/21  7:26 PM   Specimen: Nasopharyngeal Swab; Nasopharyngeal(NP) swabs in vial transport medium  Result Value Ref Range Status   SARS Coronavirus 2 by RT PCR NEGATIVE NEGATIVE Final    Comment: (NOTE) SARS-CoV-2 target nucleic acids are NOT DETECTED.  The SARS-CoV-2 RNA is generally detectable in upper respiratory specimens during the acute phase of infection. The lowest concentration of SARS-CoV-2 viral copies this assay can detect is 138 copies/mL. A negative result does not preclude SARS-Cov-2 infection and should not be used as the sole basis for treatment or other patient management decisions. A negative result may occur with  improper specimen collection/handling, submission of specimen other than nasopharyngeal swab, presence of viral mutation(s) within the areas targeted by this assay, and inadequate number of viral copies(<138 copies/mL). A negative result must be combined with clinical observations, patient history, and epidemiological information. The expected result is Negative.  Fact Sheet for Patients:  BloggerCourse.com  Fact Sheet for Healthcare Providers:  SeriousBroker.it  This test is no t yet approved or cleared by the [place] FDA and  has been authorized for detection and/or diagnosis of SARS-CoV-2 by FDA under an Emergency Use Authorization (EUA). This EUA will remain  in effect (meaning this test can be used) for the duration of the COVID-19 declaration under Section 564(b)(1) of the Act, 21 U.S.C.section 360bbb-3(b)(1), unless the authorization is terminated  or revoked sooner.       Influenza A by PCR NEGATIVE NEGATIVE Final   Influenza B by PCR NEGATIVE NEGATIVE Final    Comment: (NOTE) The Xpert Xpress SARS-CoV-2/FLU/RSV plus assay is intended as  an aid in the diagnosis of influenza from Nasopharyngeal swab specimens and should not be used as a sole basis for treatment. Nasal washings and aspirates are unacceptable for Xpert Xpress SARS-CoV-2/FLU/RSV testing.  Fact Sheet for Patients: BloggerCourse.com  Fact Sheet for Healthcare Providers: SeriousBroker.it  This test is not yet approved or cleared by the [place] FDA and has been authorized for detection and/or diagnosis of SARS-CoV-2 by FDA under an Emergency Use Authorization (EUA). This EUA will remain in effect (meaning this test can be used) for the duration of the COVID-19 declaration under Section 564(b)(1) of the Act, 21 U.S.C. section 360bbb-3(b)(1), unless the authorization is terminated or revoked.  Performed at [facility], [address], [place], [place] [address]       Scheduled Meds:  apixaban  10 mg Oral BID   Followed by   Melene Muller ON 08/14/2021] apixaban  5 mg Oral BID   mouth  rinse  15 mL Mouth Rinse BID   metoprolol tartrate  2.5 mg Intravenous Q12H      LOS: 5 days   Lonia Blood, MD [facility] Office  [phone] Pager - Text Page per Amion  If 7PM-7AM, please contact night-coverage per Amion 08/11/2021, 9:05 AM

## 2021-08-11 NOTE — Progress Notes (Addendum)
ANTICOAGULATION CONSULT NOTE  Pharmacy Consult for Enoxaparin   Indication: PE, RLE DVT  No Known Allergies  Patient Measurements: Height: 6' (182.9 cm) Weight: 75.9 kg (167 lb 5.3 oz) IBW/kg (Calculated) : 77.6  Vital Signs:    Labs: Recent Labs    08/08/21 1342 08/10/21 0457 08/11/21 0538  HGB  --   --  12.7*  HCT  --   --  37.7*  PLT  --   --  157  CREATININE  --  0.60* 0.54*  CKTOTAL 1,030* 785* 438*     Estimated Creatinine Clearance: 98.8 mL/min (A) (by C-G formula based on SCr of 0.54 mg/dL (L)).   Medical History: Past Medical History:  Diagnosis Date   Hypertension    Assessment: 65 y/o M with a h/o dementia admitted from nursing home for abnormal gait. CTA revealed PE, and lower extremity venous duplex + for RLE DVT. Patient initially placed on IV heparin, then transitioned to PO Apixaban. However, patient unable to consistently take Apixaban, so pharmacy has been consulted to transition to SQ Enoxaparin. Last dose of Apixaban 10mg  PO given 11/3 at 2358.   Today, 08/11/2021: SCr 0.54, CrCl ~ 99 ml/min CBC: Hgb 12.7, low but stable. Pltc WNL.  No bleeding issues documented   Goal of Therapy:  Anti-Xa level 0.6-1 units/ml 4hrs after LMWH dose given Monitor platelets by anticoagulation protocol: Yes   Plan:  Start Enoxaparin 80mg  SQ q12h Monitor CBC at least q72h, renal function, and for s/sx of bleeding   13/02/2021, PharmD, BCPS Clinical Pharmacist  08/11/2021,10:05 AM

## 2021-08-11 NOTE — Progress Notes (Signed)
Occupational Therapy Treatment Patient Details Name: Micheal [name] MRN: [id number] DOB: 1955-11-22 Today's Date: 08/11/2021   History of present illness Micheal [name] is a 65 y.o. male with a PMH significant for frontotemporal dementia with behavioral disturbance, HTN.  They presented from nursing home to the ED on 08/05/2021 with change in baseline mental status . Admitted with encephalopathy and PE.   OT comments  Patient presents supine in bed with eyes closed, safety mittens donned and arms restrained. With verbal and tactile cues to sternum patient did not open eyes. RN reports patient has been like this and not eating, drinking or taking medications. To promote increased alertness patient transitioned in to sitting at side of bed with total assist (+2). Therapist provided multiple types of cues to get patient to open his eyes - verbal, tactile, proprioception through large joints, standing and feeding. Patient with his eyes closed 99% of the time. Opened his eyes with initial sitting and standing but then would close them again. Patient would respond to straw placed between his lips and suck - almost every time. With attempts at feeding chocolate pudding therapist would have to smear pudding on lips and outer teeth to elicit at least a licking of the lips reaction. Twice he opened his eyes and saw the spoon and opened his mouth otherwise he would not. He was max x 2 to stand from bed twice - with patient assisting with powering up about 75% the way through. He was actually able to maintain standing balance with someone on each side of him for safety. He was able to take one step to head of the bed with tactile cues and total assist to return to supine. Per chart review - a facility representative says he does best with direct eye contact and face to face communication - however, he would not open his eyes. I think patient will be able to participate more with multimodal cues once he is able to  keep his eyes open. Cont POC.   Recommendations for follow up therapy are one component of a multi-disciplinary discharge planning process, led by the attending physician.  Recommendations may be updated based on patient status, additional functional criteria and insurance authorization.    Follow Up Recommendations  Skilled nursing-short term rehab (<3 hours/day)    Assistance Recommended at Discharge Frequent or constant Supervision/Assistance  Equipment Recommendations   (TBD)    Recommendations for Other Services      Precautions / Restrictions Precautions Precautions: Fall Precaution Comments: advanced dementia Restrictions Weight Bearing Restrictions: No       Mobility Bed Mobility Overal bed mobility: Needs Assistance Bed Mobility: Supine to Sit;Sit to Supine     Supine to sit: Total assist;+2 for physical assistance;HOB elevated Sit to supine: Total assist;+2 for physical assistance   General bed mobility comments: Patient did not open eyes with multimodal cues. +2 total assist to transfer to side of bed. Patinet could maintain sitting balance at edge of bed and provided with min guard. Toatl assist to return to supine.    Transfers Overall transfer level: Needs assistance Equipment used: 2 person hand held assist Transfers: Sit to/from Stand Sit to Stand: Max assist;+2 physical assistance;From elevated surface           General transfer comment: Max x 2 to come in to standing x 2 - patient required heavy tactile cues to begin to assist with stand. Able to maintain standing with min assist x 2 (somebody on each side of him) at  side of bed. Able to take one steps towards head of bed before he sat down. Kept eyes closed except for a 1-2 seconds.     Balance Overall balance assessment: Needs assistance Sitting-balance support: No upper extremity supported Sitting balance-Leahy Scale: Fair     Standing balance support: No upper extremity supported Standing  balance-Leahy Scale: Fair Standing balance comment: able to maintain statc standing balance  - +2 assistance for safety                           ADL either performed or assessed with clinical judgement   ADL Overall ADL's : Needs assistance/impaired Eating/Feeding: Sitting;Total assistance Eating/Feeding Details (indicate cue type and reason): total assist to feed at edge of bed. patient able to be cued to suck on straw by placing straw between lips but with spooned pudding patient not opening mouth. Patient kept his eyes closed 99% of the time. Twice when his eyes opened he opened his mouth when he saw the food. Otherwise therapist smeared the pudding on his lower lip and near his teeth and he would eventually lick his lips. Grooming: Wash/dry face;Sitting Grooming Details (indicate cue type and reason): at edge of bed - total assist to wash face.                                     Vision   Additional Comments: unknown   Perception     Praxis      Cognition Arousal/Alertness: Lethargic (keeps eyes closed) Behavior During Therapy: Flat affect Overall Cognitive Status: History of cognitive impairments - at baseline                                 General Comments: Hx of dementia, Hx of being nonverbal - per chart review facility reports he does best with direct eye contact and direct face to face contact          Exercises     Shoulder Instructions       General Comments      Pertinent Vitals/ Pain       Pain Assessment: PAINAD Breathing: normal Negative Vocalization: none Facial Expression: smiling or inexpressive Body Language: relaxed Consolability: no need to console PAINAD Score: 0  Home Living                                          Prior Functioning/Environment              Frequency  Min 2X/week        Progress Toward Goals  OT Goals(current goals can now be found in the care plan  section)  Progress towards OT goals: Progressing toward goals  Acute Rehab OT Goals OT Goal Formulation: Patient unable to participate in goal setting Time For Goal Achievement: 08/22/21 Potential to Achieve Goals: Fair  Plan Discharge plan remains appropriate    Co-evaluation                 AM-PAC OT "6 Clicks" Daily Activity     Outcome Measure   Help from another person eating meals?: Total Help from another person taking care of personal grooming?: Total Help from another person toileting,  which includes using toliet, bedpan, or urinal?: Total Help from another person bathing (including washing, rinsing, drying)?: Total Help from another person to put on and taking off regular upper body clothing?: Total Help from another person to put on and taking off regular lower body clothing?: Total 6 Click Score: 6    End of Session    OT Visit Diagnosis: Unsteadiness on feet (R26.81);Other abnormalities of gait and mobility (R26.89);Muscle weakness (generalized) (M62.81)   Activity Tolerance Patient limited by lethargy   Patient Left in bed;with call bell/phone within reach;with bed alarm set   Nurse Communication Mobility status (able to drink and eat a little)        Time: 1962-2297 OT Time Calculation (min): 27 min  Charges: OT General Charges $OT Visit: 1 Visit OT Treatments $Self Care/Home Management : 8-22 mins $Therapeutic Activity: 8-22 mins  Laurelyn Terrero, OTR/L Acute Care Rehab Services  Office [phone] Pager: [phone]   Kelli Churn 08/11/2021, 12:58 PM

## 2021-08-11 NOTE — Plan of Care (Signed)
  Problem: Safety: Goal: Non-violent Restraint(s) Outcome: Not Progressing   Problem: Safety: Goal: Non-violent Restraint(s) Outcome: Not Progressing   Problem: Education: Goal: Knowledge of General Education information will improve Description: Including pain rating scale, medication(s)/side effects and non-pharmacologic comfort measures Outcome: Not Progressing   Problem: Health Behavior/Discharge Planning: Goal: Ability to manage health-related needs will improve Outcome: Not Progressing   Problem: Clinical Measurements: Goal: Ability to maintain clinical measurements within normal limits will improve Outcome: Progressing Goal: Will remain free from infection Outcome: Progressing Goal: Diagnostic test results will improve Outcome: Progressing Goal: Respiratory complications will improve Outcome: Progressing Goal: Cardiovascular complication will be avoided Outcome: Progressing   Problem: Activity: Goal: Risk for activity intolerance will decrease Outcome: Not Progressing   Problem: Nutrition: Goal: Adequate nutrition will be maintained Outcome: Not Progressing   Problem: Elimination: Goal: Will not experience complications related to bowel motility Outcome: Progressing Goal: Will not experience complications related to urinary retention Outcome: Progressing   Problem: Pain Managment: Goal: General experience of comfort will improve Outcome: Progressing   Problem: Safety: Goal: Ability to remain free from injury will improve Outcome: Progressing   Problem: Skin Integrity: Goal: Risk for impaired skin integrity will decrease Outcome: Progressing

## 2021-08-12 LAB — CK: Total CK: 226 U/L (ref 49–397)

## 2021-08-12 MED ORDER — LORAZEPAM 2 MG/ML IJ SOLN: 0.5000 mg | Freq: Four times a day (QID) | INTRAMUSCULAR | Status: DC | PRN

## 2021-08-12 MED ORDER — BENZTROPINE MESYLATE 1 MG PO TABS
1.0000 mg | ORAL_TABLET | Freq: Two times a day (BID) | ORAL | Status: DC
  Administered 2021-08-12 – 2021-08-14 (×4): 1 mg via ORAL
  Filled 2021-08-12 (×7): qty 1

## 2021-08-12 MED ORDER — BENZTROPINE MESYLATE 1 MG/ML IJ SOLN
1.0000 mg | Freq: Two times a day (BID) | INTRAMUSCULAR | Status: DC
  Administered 2021-08-12: 1 mg via INTRAMUSCULAR
  Filled 2021-08-12 (×7): qty 1

## 2021-08-12 NOTE — Progress Notes (Signed)
Pt is injury free, afebrile, alert, and confused X 4. Vital signs were within the baseline during this shift. No s/s or indications of pain. Pt remains on non-violent restraints and video monitoring. He refused to anything by mouth. No s/s of chest pain, SOB, bleeding, infection or acute changes during this shift. We will continue to monitor and work toward achieving the care plan goals

## 2021-08-12 NOTE — Progress Notes (Signed)
Urine now pink and cloudy. MD made aware. No new orders.

## 2021-08-12 NOTE — Plan of Care (Signed)
  Problem: Safety: Goal: Non-violent Restraint(s) Outcome: Progressing   Problem: Safety: Goal: Non-violent Restraint(s) Outcome: Progressing   Problem: Education: Goal: Knowledge of General Education information will improve Description: Including pain rating scale, medication(s)/side effects and non-pharmacologic comfort measures Outcome: Progressing   Problem: Health Behavior/Discharge Planning: Goal: Ability to manage health-related needs will improve Outcome: Not Progressing   Problem: Clinical Measurements: Goal: Ability to maintain clinical measurements within normal limits will improve Outcome: Progressing Goal: Will remain free from infection Outcome: Progressing Goal: Diagnostic test results will improve Outcome: Progressing Goal: Respiratory complications will improve Outcome: Progressing Goal: Cardiovascular complication will be avoided Outcome: Progressing   Problem: Nutrition: Goal: Adequate nutrition will be maintained Outcome: Not Progressing   Problem: Coping: Goal: Level of anxiety will decrease Outcome: Progressing   Problem: Elimination: Goal: Will not experience complications related to bowel motility Outcome: Progressing Goal: Will not experience complications related to urinary retention Outcome: Progressing   Problem: Pain Managment: Goal: General experience of comfort will improve Outcome: Progressing   Problem: Safety: Goal: Ability to remain free from injury will improve Outcome: Progressing   Problem: Skin Integrity: Goal: Risk for impaired skin integrity will decrease Outcome: Progressing

## 2021-08-12 NOTE — Consult Note (Signed)
Pt familiar to psych consult service; please see first note for full eval   Patient seen today face to face for follow up assessment. He is alert, awake, cooperative and smiling. He is non-verbal but nod yes or no appropriately to questions asked. He seems to have shown a favorable response to IM Cogentin 1 mg x 2 doses. CK level trending down from 438 yesterday to 226 today.   Plan -Will start Cogentin 1 mg po BID for EPS. -Discontinue Long acting injectable-Haloperidol.  -Psychiatric service will continue to follow  Thedore Mins, MD Attending Psychiatrist

## 2021-08-12 NOTE — Progress Notes (Signed)
Micheal [name]  QDI:[id number] DOB: October 01, 1956 DOA: 08/05/2021 PCP: Kirby Funk, MD    Brief Narrative:  [phone] with a history of frontotemporal dementia who presented to the ED from his nursing home 10/30 with altered mental status.  Consultants:  Neurology Psychiatry  Code Status: NO CODE BLUE  Antimicrobials:  None  DVT prophylaxis: Eliquis  Subjective: Afebrile.  Vital signs stable.  No acute events overnight.  No evidence of respiratory distress.  Patient has frequently been refusing oral medications over the last 36 hours.  Significantly more alert today.  Turns eyes to examiner and engages in conversation, though he is confused.  Assessment & Plan:  Altered mental status CT head unrevealing - brain MRI without acute findings -TTE with bubble study negative - UA and CXR without evidence of acute infection -vitals stable - ABG unrevealing -ammonia normal - RPR nonreactive - folate normal and B12 only modestly deficient/low normal - have minimized sedating medications with assistance from Psychiatry  Rigidity Treatment being supervised by Psychiatry -initial course of action was to discontinue oral antipsychotics but this has had no significant effect -trial of Cogentin initiated 11/4 as patient is on long-acting injected Haldol -given apparent response/improvement will initiate scheduled Cogentin  Modestly elevated CK CK transiently abnormally elevated but not markedly so -normalized with gentle hydration  Acute pulmonary embolism - acute right popliteal DVT CTa noted right upper lobe pulmonary emboli with a small clot burden - transitioned from heparin drip to Eliquis then to lovenox due to refusal to take oral meds   Frontotemporal dementia Episodes of agitation and intermittent sedation at baseline - medication adjustment per Psychiatry   Hypokalemia Resolved  Modest B12 deficiency Supplementing   Family Communication: No family present at time of exam Status  is: Inpatient - awaiting SNF placement for long-term care   Objective: Blood pressure 114/86, pulse 65, temperature 97.8 F (36.6 C), temperature source Oral, resp. rate 20, height 6' (1.829 m), weight 75.9 kg, SpO2 (!) 76 %.  Intake/Output Summary (Last 24 hours) at 08/12/2021 0923 Last data filed at 08/11/2021 1700 Gross per 24 hour  Intake 1762.92 ml  Output 600 ml  Net 1162.92 ml    Filed Weights   08/06/21 0526 08/07/21 0946  Weight: 81.6 kg 75.9 kg    Examination: General: No acute respiratory distress Lungs: Clear to auscultation bilaterally -no wheezing Cardiovascular: Regular rate and rhythm -no murmur Abdomen: Nondistended, soft, bowel sounds positive Extremities: No edema bilateral lower extremities  CBC: Recent Labs  Lab 08/05/21 1924 08/05/21 1929 08/07/21 0425 08/08/21 0509 08/11/21 0538  WBC 7.4   < > 7.4 6.5 10.5  NEUTROABS 5.4  --   --   --   --   HGB 12.7*   < > 12.2* 13.1 12.7*  HCT 38.0*   < > 36.5* 39.6 37.7*  MCV 98.2   < > 97.1 96.6 96.9  PLT 148*   < > 148* 154 157   < > = values in this interval not displayed.    Basic Metabolic Panel: Recent Labs  Lab 08/07/21 0425 08/10/21 0457 08/11/21 0538  NA 140 143 141  K 3.6 3.7 3.6  CL 106 107 109  CO2 26 26 24   GLUCOSE 81 101* 136*  BUN 10 22 19   CREATININE 0.63 0.60* 0.54*  CALCIUM 8.3* 8.4* 8.3*    GFR: Estimated Creatinine Clearance: 98.8 mL/min (A) (by C-G formula based on SCr of 0.54 mg/dL (L)).  Liver Function Tests: Recent Labs  Lab  08/05/21 1924 08/11/21 0538  AST 27 29  ALT 15 21  ALKPHOS 63 50  BILITOT 0.6 1.2  PROT 6.1* 5.9*  ALBUMIN 3.6 3.2*      Coagulation Profile: Recent Labs  Lab 08/05/21 1924  INR 1.0      HbA1C: Hgb A1c MFr Bld  Date/Time Value Ref Range Status  08/06/2021 04:33 PM 5.6 4.8 - 5.6 % Final    Comment:    (NOTE) Pre diabetes:          5.7%-6.4%  Diabetes:              >6.4%  Glycemic control for   <7.0% adults with  diabetes     CBG: Recent Labs  Lab 08/05/21 1920  GLUCAP 132*     Recent Results (from the past 240 hour(s))  Resp Panel by RT-PCR (Flu A&B, Covid) Nasopharyngeal Swab     Status: None   Collection Time: 08/05/21  7:26 PM   Specimen: Nasopharyngeal Swab; Nasopharyngeal(NP) swabs in vial transport medium  Result Value Ref Range Status   SARS Coronavirus 2 by RT PCR NEGATIVE NEGATIVE Final    Comment: (NOTE) SARS-CoV-2 target nucleic acids are NOT DETECTED.  The SARS-CoV-2 RNA is generally detectable in upper respiratory specimens during the acute phase of infection. The lowest concentration of SARS-CoV-2 viral copies this assay can detect is 138 copies/mL. A negative result does not preclude SARS-Cov-2 infection and should not be used as the sole basis for treatment or other patient management decisions. A negative result may occur with  improper specimen collection/handling, submission of specimen other than nasopharyngeal swab, presence of viral mutation(s) within the areas targeted by this assay, and inadequate number of viral copies(<138 copies/mL). A negative result must be combined with clinical observations, patient history, and epidemiological information. The expected result is Negative.  Fact Sheet for Patients:  BloggerCourse.com  Fact Sheet for Healthcare Providers:  SeriousBroker.it  This test is no t yet approved or cleared by the [place] FDA and  has been authorized for detection and/or diagnosis of SARS-CoV-2 by FDA under an Emergency Use Authorization (EUA). This EUA will remain  in effect (meaning this test can be used) for the duration of the COVID-19 declaration under Section 564(b)(1) of the Act, 21 U.S.C.section 360bbb-3(b)(1), unless the authorization is terminated  or revoked sooner.       Influenza A by PCR NEGATIVE NEGATIVE Final   Influenza B by PCR NEGATIVE NEGATIVE Final    Comment:  (NOTE) The Xpert Xpress SARS-CoV-2/FLU/RSV plus assay is intended as an aid in the diagnosis of influenza from Nasopharyngeal swab specimens and should not be used as a sole basis for treatment. Nasal washings and aspirates are unacceptable for Xpert Xpress SARS-CoV-2/FLU/RSV testing.  Fact Sheet for Patients: BloggerCourse.com  Fact Sheet for Healthcare Providers: SeriousBroker.it  This test is not yet approved or cleared by the [place] FDA and has been authorized for detection and/or diagnosis of SARS-CoV-2 by FDA under an Emergency Use Authorization (EUA). This EUA will remain in effect (meaning this test can be used) for the duration of the COVID-19 declaration under Section 564(b)(1) of the Act, 21 U.S.C. section 360bbb-3(b)(1), unless the authorization is terminated or revoked.  Performed at [facility], [address] [place], [place] [address]       Scheduled Meds:  cyanocobalamin  1,000 mcg Subcutaneous Daily   enoxaparin (LOVENOX) injection  80 mg Subcutaneous Q12H   mouth rinse  15 mL Mouth  Rinse BID   metoprolol tartrate  2.5 mg Intravenous Q12H      LOS: 6 days   Lonia Blood, MD [facility] Office  [phone] Pager - Text Page per Amion  If 7PM-7AM, please contact night-coverage per Amion 08/12/2021, 9:23 AM

## 2021-08-13 LAB — CBC
HCT: 35 % — ABNORMAL LOW (ref 39.0–52.0)
Hemoglobin: 11.8 g/dL — ABNORMAL LOW (ref 13.0–17.0)
MCH: 32.6 pg (ref 26.0–34.0)
MCHC: 33.7 g/dL (ref 30.0–36.0)
MCV: 96.7 fL (ref 80.0–100.0)
Platelets: 160 10*3/uL (ref 150–400)
RBC: 3.62 MIL/uL — ABNORMAL LOW (ref 4.22–5.81)
RDW: 12.7 % (ref 11.5–15.5)
WBC: 7.2 10*3/uL (ref 4.0–10.5)
nRBC: 0 % (ref 0.0–0.2)

## 2021-08-13 LAB — RESP PANEL BY RT-PCR (FLU A&B, COVID) ARPGX2
Influenza A by PCR: POSITIVE — AB
Influenza B by PCR: NEGATIVE
SARS Coronavirus 2 by RT PCR: NEGATIVE

## 2021-08-13 MED ORDER — APIXABAN 5 MG PO TABS: 10.0000 mg | ORAL_TABLET | Freq: Two times a day (BID) | ORAL | Status: DC

## 2021-08-13 MED ORDER — APIXABAN 5 MG PO TABS: 5.0000 mg | ORAL_TABLET | Freq: Two times a day (BID) | ORAL | Status: DC

## 2021-08-13 MED ORDER — APIXABAN 5 MG PO TABS
5.0000 mg | ORAL_TABLET | Freq: Two times a day (BID) | ORAL | Status: DC
  Administered 2021-08-13 – 2021-08-14 (×2): 5 mg via ORAL
  Filled 2021-08-13 (×2): qty 1

## 2021-08-13 NOTE — Progress Notes (Signed)
Micheal [name]  URK:[id number] DOB: 29-Feb-1956 DOA: 08/05/2021 PCP: Kirby Funk, MD    Brief Narrative:  [phone] with a history of frontotemporal dementia who presented to the ED from his nursing home 10/30 with altered mental status.  Consultants:  Neurology Psychiatry  Code Status: NO CODE BLUE  Antimicrobials:  None  DVT prophylaxis: Eliquis  Interim history: Afebrile.  Vital signs stable.  Remains much more alert today.  Interactive.  Will answer simple questions.  Staff from his previous residential facility visit him today and with their help he was able to ambulate briskly down the hall and back.  Assessment & Plan:  Altered mental status - suspected EPS CT head unrevealing - brain MRI without acute findings - TTE with bubble study negative - UA and CXR without evidence of acute infection -vitals stable - ABG unrevealing -ammonia normal - RPR nonreactive - folate normal and B12 only modestly deficient/low normal - have minimized sedating medications with assistance from Psychiatry -appears to be making significant progress with consistent Cogentin dosing  Rigidity Treatment being supervised by Psychiatry -initial course of action was to discontinue oral antipsychotics but this has had no significant effect -trial of Cogentin initiated 11/4 as patient is on long-acting injected Haldol -given apparent response/improvement initiated scheduled Cogentin with good clinical response  Modestly elevated CK CK transiently abnormally elevated but not markedly so - normalized with gentle hydration  Acute pulmonary embolism - acute right popliteal DVT CTa noted right upper lobe pulmonary emboli with a small clot burden - transitioned from heparin drip to Eliquis then to lovenox due to refusal to take oral meds   Frontotemporal dementia Episodes of agitation and intermittent sedation at baseline - medication adjustment per Psychiatry   Hypokalemia Resolved  Modest B12  deficiency Supplementing   Family Communication: No family present at time of exam Status is: Inpatient -plan to return to his previous residential facility 11/8   Objective: Blood pressure 106/71, pulse (!) 51, temperature 98 F (36.7 C), temperature source Oral, resp. rate 20, height 6' (1.829 m), weight 75.9 kg, SpO2 (!) 89 %.  Intake/Output Summary (Last 24 hours) at 08/13/2021 0908 Last data filed at 08/13/2021 0500 Gross per 24 hour  Intake 0 ml  Output 1100 ml  Net -1100 ml    Filed Weights   08/06/21 0526 08/07/21 0946  Weight: 81.6 kg 75.9 kg    Examination: General: No acute respiratory distress Lungs: Clear to auscultation bilaterally without wheezing Cardiovascular: Regular rate and rhythm  Abdomen: Nondistended, soft, bowel sounds positive Extremities: No edema B LE  CBC: Recent Labs  Lab 08/08/21 0509 08/11/21 0538 08/13/21 0436  WBC 6.5 10.5 7.2  HGB 13.1 12.7* 11.8*  HCT 39.6 37.7* 35.0*  MCV 96.6 96.9 96.7  PLT 154 157 160    Basic Metabolic Panel: Recent Labs  Lab 08/07/21 0425 08/10/21 0457 08/11/21 0538  NA 140 143 141  K 3.6 3.7 3.6  CL 106 107 109  CO2 26 26 24   GLUCOSE 81 101* 136*  BUN 10 22 19   CREATININE 0.63 0.60* 0.54*  CALCIUM 8.3* 8.4* 8.3*    GFR: Estimated Creatinine Clearance: 98.8 mL/min (A) (by C-G formula based on SCr of 0.54 mg/dL (L)).  Liver Function Tests: Recent Labs  Lab 08/11/21 0538  AST 29  ALT 21  ALKPHOS 50  BILITOT 1.2  PROT 5.9*  ALBUMIN 3.2*     HbA1C: Hgb A1c MFr Bld  Date/Time Value Ref Range Status  08/06/2021 04:33 PM  5.6 4.8 - 5.6 % Final    Comment:    (NOTE) Pre diabetes:          5.7%-6.4%  Diabetes:              >6.4%  Glycemic control for   <7.0% adults with diabetes      Recent Results (from the past 240 hour(s))  Resp Panel by RT-PCR (Flu A&B, Covid) Nasopharyngeal Swab     Status: None   Collection Time: 08/05/21  7:26 PM   Specimen: Nasopharyngeal Swab;  Nasopharyngeal(NP) swabs in vial transport medium  Result Value Ref Range Status   SARS Coronavirus 2 by RT PCR NEGATIVE NEGATIVE Final    Comment: (NOTE) SARS-CoV-2 target nucleic acids are NOT DETECTED.  The SARS-CoV-2 RNA is generally detectable in upper respiratory specimens during the acute phase of infection. The lowest concentration of SARS-CoV-2 viral copies this assay can detect is 138 copies/mL. A negative result does not preclude SARS-Cov-2 infection and should not be used as the sole basis for treatment or other patient management decisions. A negative result may occur with  improper specimen collection/handling, submission of specimen other than nasopharyngeal swab, presence of viral mutation(s) within the areas targeted by this assay, and inadequate number of viral copies(<138 copies/mL). A negative result must be combined with clinical observations, patient history, and epidemiological information. The expected result is Negative.  Fact Sheet for Patients:  BloggerCourse.com  Fact Sheet for Healthcare Providers:  SeriousBroker.it  This test is no t yet approved or cleared by the [place] FDA and  has been authorized for detection and/or diagnosis of SARS-CoV-2 by FDA under an Emergency Use Authorization (EUA). This EUA will remain  in effect (meaning this test can be used) for the duration of the COVID-19 declaration under Section 564(b)(1) of the Act, 21 U.S.C.section 360bbb-3(b)(1), unless the authorization is terminated  or revoked sooner.       Influenza A by PCR NEGATIVE NEGATIVE Final   Influenza B by PCR NEGATIVE NEGATIVE Final    Comment: (NOTE) The Xpert Xpress SARS-CoV-2/FLU/RSV plus assay is intended as an aid in the diagnosis of influenza from Nasopharyngeal swab specimens and should not be used as a sole basis for treatment. Nasal washings and aspirates are unacceptable for Xpert Xpress  SARS-CoV-2/FLU/RSV testing.  Fact Sheet for Patients: BloggerCourse.com  Fact Sheet for Healthcare Providers: SeriousBroker.it  This test is not yet approved or cleared by the [place] FDA and has been authorized for detection and/or diagnosis of SARS-CoV-2 by FDA under an Emergency Use Authorization (EUA). This EUA will remain in effect (meaning this test can be used) for the duration of the COVID-19 declaration under Section 564(b)(1) of the Act, 21 U.S.C. section 360bbb-3(b)(1), unless the authorization is terminated or revoked.  Performed at [facility], [address] [place], [place] [address]       Scheduled Meds:  benztropine  1 mg Oral BID   Or   benztropine mesylate  1 mg Intramuscular BID   cyanocobalamin  1,000 mcg Subcutaneous Daily   enoxaparin (LOVENOX) injection  80 mg Subcutaneous Q12H   mouth rinse  15 mL Mouth Rinse BID      LOS: 7 days   Lonia Blood, MD [facility] Office  [phone] Pager - Text Page per Loretha Stapler  If 7PM-7AM, please contact night-coverage per Amion 08/13/2021, 9:08 AM

## 2021-08-13 NOTE — Progress Notes (Signed)
Pt is injury free, afebrile, alert, and confused X 4. Vital signs were within the baseline during this shift. He was more alert and verbal. No s/s or indications of pain. Pt remains on non-violent restraints and video monitoring. He refused to take anything by mouth during this shift. No s/s of chest pain, SOB, bleeding, infection or acute changes during this shift. We will continue to monitor and work toward achieving the care plan goals

## 2021-08-13 NOTE — Progress Notes (Incomplete)
Spoke with Sister, Cloria Spring, she is

## 2021-08-13 NOTE — Plan of Care (Signed)
  Problem: Safety: Goal: Non-violent Restraint(s) Outcome: Progressing   Problem: Safety: Goal: Non-violent Restraint(s) Outcome: Progressing   Problem: Education: Goal: Knowledge of General Education information will improve Description: Including pain rating scale, medication(s)/side effects and non-pharmacologic comfort measures Outcome: Not Progressing   Problem: Health Behavior/Discharge Planning: Goal: Ability to manage health-related needs will improve Outcome: Not Progressing   Problem: Clinical Measurements: Goal: Ability to maintain clinical measurements within normal limits will improve Outcome: Progressing Goal: Will remain free from infection Outcome: Progressing Goal: Diagnostic test results will improve Outcome: Progressing Goal: Respiratory complications will improve Outcome: Progressing Goal: Cardiovascular complication will be avoided Outcome: Progressing   Problem: Activity: Goal: Risk for activity intolerance will decrease Outcome: Not Progressing   Problem: Nutrition: Goal: Adequate nutrition will be maintained Outcome: Progressing   Problem: Coping: Goal: Level of anxiety will decrease Outcome: Progressing   Problem: Elimination: Goal: Will not experience complications related to bowel motility Outcome: Progressing Goal: Will not experience complications related to urinary retention Outcome: Progressing   Problem: Pain Managment: Goal: General experience of comfort will improve Outcome: Progressing   Problem: Safety: Goal: Ability to remain free from injury will improve Outcome: Progressing   Problem: Skin Integrity: Goal: Risk for impaired skin integrity will decrease Outcome: Progressing

## 2021-08-14 MED ORDER — ACETAMINOPHEN 325 MG PO TABS: 650.0000 mg | ORAL_TABLET | ORAL | Status: AC | PRN

## 2021-08-14 MED ORDER — BENZTROPINE MESYLATE 1 MG PO TABS: 1.0000 mg | ORAL_TABLET | Freq: Two times a day (BID) | ORAL | 1 refills | Status: AC

## 2021-08-14 MED ORDER — APIXABAN 5 MG PO TABS: 5.0000 mg | ORAL_TABLET | Freq: Two times a day (BID) | ORAL | 1 refills | Status: DC

## 2021-08-14 NOTE — Plan of Care (Signed)
°  Problem: Clinical Measurements: °Goal: Respiratory complications will improve °Outcome: Progressing °Goal: Cardiovascular complication will be avoided °Outcome: Progressing °  °Problem: Activity: °Goal: Risk for activity intolerance will decrease °Outcome: Progressing °  °

## 2021-08-14 NOTE — Progress Notes (Signed)
Occupational Therapy Treatment Patient Details Name: Micheal [name] MRN: [id number] DOB: April 09, 1956 Today's Date: 08/14/2021   History of present illness Micheal [name] is a 65 y.o. male with a PMH significant for frontotemporal dementia with behavioral disturbance, HTN.  They presented from nursing home to the ED on 08/05/2021 with change in baseline mental status . Admitted with encephalopathy and PE. 11/7 tested positive for flu.   OT comments  Patient is able to complete self feeding tasks with set up sitting on edge of bed with mitts removed. Patient was able to participate in functional mobility in room with +2 for safety. Patient's discharge plan remains appropriate at this time. OT will continue to follow acutely.     Recommendations for follow up therapy are one component of a multi-disciplinary discharge planning process, led by the attending physician.  Recommendations may be updated based on patient status, additional functional criteria and insurance authorization.    Follow Up Recommendations  Skilled nursing-short term rehab (<3 hours/day)    Assistance Recommended at Discharge    Equipment Recommendations  None recommended by OT    Recommendations for Other Services      Precautions / Restrictions Precautions Precautions: Fall Precaution Comments: advanced dementia Restrictions Weight Bearing Restrictions: No       Mobility Bed Mobility                    Transfers                         Balance                                           ADL either performed or assessed with clinical judgement   ADL Overall ADL's : Needs assistance/impaired Eating/Feeding: Set up Eating/Feeding Details (indicate cue type and reason): sitting on edge of bed with increased time. patient was able to use fork to scoop and spear meal and bring to mouth with minimal spillage noted. patient remained appropriate during session Grooming:  Wash/dry hands;Minimal assistance Grooming Details (indicate cue type and reason): sitting on edge of bed                     Toileting- Clothing Manipulation and Hygiene: Maximal assistance;Bed level       Functional mobility during ADLs: Minimal assistance;+2 for safety/equipment;+2 for physical assistance General ADL Comments: patient was able to participate in functional mobility in room with +2 for safety with soem LOB noted.    Extremity/Trunk Assessment Upper Extremity Assessment Upper Extremity Assessment: Overall WFL for tasks assessed RUE Deficits / Details: patient was able to use RUE for self feeding tasks on this date. not formally MTM with patient having difficulty following commands            Vision   Additional Comments: unknown   Perception     Praxis      Cognition Arousal/Alertness: Awake/alert Behavior During Therapy: Flat affect Overall Cognitive Status: History of cognitive impairments - at baseline                                            Exercises     Shoulder Instructions       General Comments  Pertinent Vitals/ Pain       Pain Assessment: Faces Faces Pain Scale: Hurts a little bit Pain Location: LLE with inital standing Pain Intervention(s): Monitored during session  Home Living Family/patient expects to be discharged to:: Assisted living                                        Prior Functioning/Environment              Frequency  Min 2X/week        Progress Toward Goals  OT Goals(current goals can now be found in the care plan section)  Progress towards OT goals: Progressing toward goals     Plan Discharge plan remains appropriate    Co-evaluation    PT/OT/SLP Co-Evaluation/Treatment: Yes Reason for Co-Treatment: For patient/therapist safety PT goals addressed during session: Mobility/safety with mobility OT goals addressed during session: ADL's and self-care       AM-PAC OT "6 Clicks" Daily Activity     Outcome Measure   Help from another person eating meals?: A Little Help from another person taking care of personal grooming?: A Little Help from another person toileting, which includes using toliet, bedpan, or urinal?: A Lot Help from another person bathing (including washing, rinsing, drying)?: A Lot Help from another person to put on and taking off regular upper body clothing?: A Little Help from another person to put on and taking off regular lower body clothing?: A Lot 6 Click Score: 15    End of Session Equipment Utilized During Treatment: Gait belt  OT Visit Diagnosis: Unsteadiness on feet (R26.81);Other abnormalities of gait and mobility (R26.89);Muscle weakness (generalized) (M62.81)   Activity Tolerance Patient tolerated treatment well   Patient Left in bed;with call bell/phone within reach;with bed alarm set;with restraints reapplied   Nurse Communication Mobility status        Time: 8921-1941 OT Time Calculation (min): 43 min  Charges: OT General Charges $OT Visit: 1 Visit OT Treatments $Self Care/Home Management : 8-22 mins  Sharyn Blitz OTR/L, MS Acute Rehabilitation Department Office# [phone] Pager# [phone]   Micheal [name] Micheal [name] 08/14/2021, 11:38 AM

## 2021-08-14 NOTE — Care Management Important Message (Signed)
Important Message  Patient Details IM Letter placed in Patients room. Name: Cathan Gearin MRN: [id number] Date of Birth: 1956/02/23   Medicare Important Message Given:  Yes     Caren Macadam 08/14/2021, 9:17 AM

## 2021-08-14 NOTE — TOC Transition Note (Signed)
Transition of Care [facility]) - CM/SW Discharge Note   Patient Details  Name: Micheal [name] MRN: [id number] Date of Birth: [birth year]  [facility] of [facility] CM/SW Contact:  Ida Rogue, LCSW Phone Number: 08/14/2021, 10:52 AM   Clinical Narrative:   Patient who is stable for discharge will return to [facility] memory care today.  PTAR arranged.  Nursing, please call  report to  [phone], ask for Crystal. TOC sign off.    Final next level of care: Assisted Living Barriers to Discharge: Barriers Resolved   Patient Goals and CMS Choice        Discharge Placement                       Discharge Plan and Services   Discharge Planning Services: CM Consult                                 Social Determinants of Health (SDOH) Interventions     Readmission Risk Interventions No flowsheet data found.

## 2021-08-14 NOTE — NC FL2 (Signed)
  [name] MEDICAID FL2 LEVEL OF CARE SCREENING TOOL     IDENTIFICATION  Patient Name: Micheal [name] Birthdate: June 14, 1956 Sex: male Admission Date (Current Location): 08/05/2021  [facility] and [place] Number:  Producer, television/film/video and Address:  [facility],  [address], [place] [address]      Provider Number: [id number]  Attending Physician Name and Address:  Lonia Blood, MD  Relative Name and Phone Number:       Current Level of Care: Hospital Recommended Level of Care: Memory Care (SCU) Prior Approval Number:    Date Approved/Denied:   PASRR Number:    Discharge Plan: Other (Comment) [facility] SCU)    Current Diagnoses: Patient Active Problem List   Diagnosis Date Noted   Unstable gait 08/06/2021   Weakness    Frontotemporal dementia with behavioral disturbance (HCC) 03/01/2020   Dehydration with hypernatremia 03/01/2020   Essential hypertension 03/01/2020    Orientation RESPIRATION BLADDER Height & Weight     Self  Normal Incontinent Weight: 75.9 kg Height:  6' (182.9 cm)  BEHAVIORAL SYMPTOMS/MOOD NEUROLOGICAL BOWEL NUTRITION STATUS      Incontinent Diet (mechanical soft)  AMBULATORY STATUS COMMUNICATION OF NEEDS Skin   Supervision Verbally Normal                       Personal Care Assistance Level of Assistance  Bathing, Feeding, Dressing Bathing Assistance: Limited assistance Feeding assistance: Limited assistance Dressing Assistance: Limited assistance     Functional Limitations Info  Sight, Hearing, Speech Sight Info: Adequate Hearing Info: Adequate Speech Info: Adequate    SPECIAL CARE FACTORS FREQUENCY                       Contractures Contractures Info: Not present    Additional Factors Info  Psychotropic Code Status Info: DNR Allergies Info: NKA Psychotropic Info: Haldol Decanoate 50mg  IM q28 days         Current Medications (08/14/2021):  This is the current hospital active  medication list Current Facility-Administered Medications  Medication Dose Route Frequency Provider Last Rate Last Admin   acetaminophen (TYLENOL) tablet 650 mg  650 mg Oral Q4H PRN 13/05/2021, MD   650 mg at 08/08/21 2116   apixaban (ELIQUIS) tablet 5 mg  5 mg Oral BID 2117, MD   5 mg at 08/14/21 1049   benztropine (COGENTIN) tablet 1 mg  1 mg Oral BID 13/08/22, MD   1 mg at 08/14/21 1049   Or   benztropine mesylate (COGENTIN) injection 1 mg  1 mg Intramuscular BID 13/08/22, MD   1 mg at 08/12/21 2253   LORazepam (ATIVAN) injection 0.5 mg  0.5 mg Intramuscular Q6H PRN 2254, MD       MEDLINE mouth rinse  15 mL Mouth Rinse BID Lonia Blood, MD   15 mL at 08/14/21 1049     Discharge Medications: Please see discharge summary for a list of discharge medications.  Relevant Imaging Results:  Relevant Lab Results:   Additional Information SS# 13/08/22  [id number], Ida Rogue

## 2021-08-14 NOTE — Discharge Summary (Addendum)
DISCHARGE SUMMARY  Micheal [name]  MR#: [id number]  DOB:July 30, 1956  Date of Admission: 08/05/2021 Date of Discharge: 08/14/2021  Attending Physician:Kemaya Dorner Silvestre Gunner, MD  Patient's YQM:VHQIONG, Jonny Ruiz, MD  Consults: Neurology Psychiatry  Disposition: D/C to prior residential facility   Follow-up Appts:  Follow-up Information     Kirby Funk, MD Follow up.   Specialty: Internal Medicine Contact information: [address] [place] [address] [phone]                 Tests Needing Follow-up: -repeat B12 level suggested in 8-12 weeks -monitor for side effects with newly initiated Cogentin therapy -monitor for side effects/bleeding with newly initiated anticoagulation  Discharge Diagnoses: Altered mental status and rigidity due to Extrapyramidal Symptoms  Modestly elevated CK Acute pulmonary embolism - acute right popliteal DVT Frontotemporal dementia Hypokalemia Modest B12 deficiency NO CODE BLUE   Future therapeutic options: It is noted that the patient was on Haldol decanoate at the time of his admission, along with numerous oral antipsychotic medications. During the course of this hospitalization we have come to the conclusion that his presenting symptoms and initial illness were in fact due to EPS as a result of these medications. At the time of his d/c, we were able to confirm the patient has indeed been on the depo haldol for at least the last 3 months. While the ideal circumstance would be for the patient to be off all antipsychotics, it is recognized that his medical illness requires some antipsychotic coverage. Accordingly, it is felt the best course of action is to continue his long acting injectible haldol as previously prescribed (or even possibly at a lower dose if his outpatient Psychiatrist feels this is reasonable), to continue Cogentin dosing as long as the patient is on haldol, and to avoid adding any further antipsychotics to  his treatment regimen. He will need to be monitored closely for return of EPS sx.    Initial presentation: 65yo with a history of frontotemporal dementia who presented to the ED from his nursing home 10/30 with altered mental status.  Hospital Course:  Altered mental status - extraparametal symptoms CT head unrevealing - brain MRI without acute findings - TTE with bubble study negative - UA and CXR without evidence of acute infection - vitals stable - ABG unrevealing - ammonia normal - RPR nonreactive - folate normal and B12 only modestly deficient/low normal - minimized sedating medications with assistance from Psychiatry - appears to be making significant progress with consistent Cogentin dosing -dramatic improvement in interaction and mobility with course of Cogentin -avoid antipsychotics   Rigidity Treatment supervised by Psychiatry -initial course of action was to discontinue antipsychotics but this alone had no significant effect -trial of Cogentin initiated 11/4 as patient is on long-acting injected Haldol -given apparent response/improvement initiated scheduled Cogentin with good clinical response -patient dramatically improved at time of discharge   Modestly elevated CK CK transiently abnormally elevated but not markedly so - normalized with gentle hydration   Acute pulmonary embolism - acute right popliteal DVT CTa noted right upper lobe pulmonary emboli with a small clot burden - transitioned from heparin drip to Eliquis then to lovenox due to refusal to take oral meds -with use of Cogentin and dramatic improvement in mental status patient was able to be transitioned back to Eliquis successfully   Frontotemporal dementia Episodes of agitation and intermittent sedation at baseline - medication adjustment per Psychiatry -mental status dramatically improved with Cogentin and patient much more cooperative at  time of discharge -patient has been evaluated by the staff of his prior  residential facility who confirmed that they can continue to provide the care he needs   Hypokalemia Resolved   Modest B12 deficiency Supplemented during this admission -repeat B12 level suggested in 8-12 weeks  Incidentally noted influenza A positive The patient's facility requested COVID testing prior to his return -this was accomplished 11/7 and was negative for COVID but surprisingly positive for influenza A -the patient has no symptoms whatsoever attributable to influenza and therefore it is not felt that he would benefit from use of Tamiflu    Allergies as of 08/14/2021   No Known Allergies      Medication List     STOP taking these medications    clonazePAM 0.5 MG tablet Commonly known as: KLONOPIN   furosemide 20 MG tablet Commonly known as: LASIX   nystatin powder Commonly known as: MYCOSTATIN/NYSTOP   ondansetron 4 MG tablet Commonly known as: ZOFRAN   oseltamivir 75 MG capsule Commonly known as: TAMIFLU   QUEtiapine 50 MG tablet Commonly known as: SEROquel   risperiDONE 1 MG tablet Commonly known as: RISPERDAL   sertraline 100 MG tablet Commonly known as: ZOLOFT   traZODone 100 MG tablet Commonly known as: DESYREL       TAKE these medications    acetaminophen 325 MG tablet Commonly known as: TYLENOL Take 2 tablets (650 mg total) by mouth every 4 (four) hours as needed for mild pain (or temp > 37.5 C (99.5 F)). What changed:  when to take this reasons to take this Another medication with the same name was removed. Continue taking this medication, and follow the directions you see here.   alum & mag hydroxide-simeth 200-200-20 MG/5ML suspension Commonly known as: MAALOX/MYLANTA Take 30 mLs by mouth every 6 (six) hours as needed for indigestion or heartburn.   apixaban 5 MG Tabs tablet Commonly known as: ELIQUIS Take 1 tablet (5 mg total) by mouth 2 (two) times daily.   benztropine 1 MG tablet Commonly known as: COGENTIN Take 1 tablet (1  mg total) by mouth 2 (two) times daily.   guaifenesin 100 MG/5ML syrup Commonly known as: ROBITUSSIN Take 200 mg by mouth every 6 (six) hours as needed for cough.   haloperidol decanoate 50 MG/ML injection Commonly known as: HALDOL DECANOATE Inject 50 mg into the muscle every 28 (twenty-eight) days.   loperamide 2 MG capsule Commonly known as: IMODIUM Take 2 mg by mouth as needed for diarrhea or loose stools (max 8 doses/ 24 hours).   magnesium hydroxide 400 MG/5ML suspension Commonly known as: MILK OF MAGNESIA Take 30 mLs by mouth at bedtime as needed (constipation).   polyethylene glycol 17 g packet Commonly known as: MIRALAX / GLYCOLAX Take 17 g by mouth daily as needed for mild constipation. What changed:  reasons to take this additional instructions   Triple Antibiotic [phone] Oint Apply 1 application topically daily as needed (minor skin tears/abrasians).        Day of Discharge BP 138/81 (BP Location: Left Arm)   Pulse 78   Temp 98.1 F (36.7 C) (Oral)   Resp 20   Ht 6' (1.829 m)   Wt 75.9 kg   SpO2 99%   BMI 22.69 kg/m   Physical Exam: General: No acute respiratory distress Lungs: Clear to auscultation bilaterally without wheezes or crackles Cardiovascular: Regular rate and rhythm without murmur gallop or rub normal S1 and S2 Abdomen: Nontender, nondistended, soft, bowel sounds  positive, no rebound, no ascites, no appreciable mass Extremities: No significant cyanosis, clubbing, or edema bilateral lower extremities  Basic Metabolic Panel: Recent Labs  Lab 08/10/21 0457 08/11/21 0538  NA 143 141  K 3.7 3.6  CL 107 109  CO2 26 24  GLUCOSE 101* 136*  BUN 22 19  CREATININE 0.60* 0.54*  CALCIUM 8.4* 8.3*    Liver Function Tests: Recent Labs  Lab 08/11/21 0538  AST 29  ALT 21  ALKPHOS 50  BILITOT 1.2  PROT 5.9*  ALBUMIN 3.2*    Recent Labs  Lab 08/11/21 0538  AMMONIA 19    CBC: Recent Labs  Lab 08/08/21 0509 08/11/21 0538  08/13/21 0436  WBC 6.5 10.5 7.2  HGB 13.1 12.7* 11.8*  HCT 39.6 37.7* 35.0*  MCV 96.6 96.9 96.7  PLT 154 157 160    Cardiac Enzymes: Recent Labs  Lab 08/08/21 1342 08/10/21 0457 08/11/21 0538 08/12/21 0607  CKTOTAL 1,030* 785* 438* 226     Recent Results (from the past 240 hour(s))  Resp Panel by RT-PCR (Flu A&B, Covid) Nasopharyngeal Swab     Status: None   Collection Time: 08/05/21  7:26 PM   Specimen: Nasopharyngeal Swab; Nasopharyngeal(NP) swabs in vial transport medium  Result Value Ref Range Status   SARS Coronavirus 2 by RT PCR NEGATIVE NEGATIVE Final    Comment: (NOTE) SARS-CoV-2 target nucleic acids are NOT DETECTED.  The SARS-CoV-2 RNA is generally detectable in upper respiratory specimens during the acute phase of infection. The lowest concentration of SARS-CoV-2 viral copies this assay can detect is 138 copies/mL. A negative result does not preclude SARS-Cov-2 infection and should not be used as the sole basis for treatment or other patient management decisions. A negative result may occur with  improper specimen collection/handling, submission of specimen other than nasopharyngeal swab, presence of viral mutation(s) within the areas targeted by this assay, and inadequate number of viral copies(<138 copies/mL). A negative result must be combined with clinical observations, patient history, and epidemiological information. The expected result is Negative.  Fact Sheet for Patients:  BloggerCourse.com  Fact Sheet for Healthcare Providers:  SeriousBroker.it  This test is no t yet approved or cleared by the [place] FDA and  has been authorized for detection and/or diagnosis of SARS-CoV-2 by FDA under an Emergency Use Authorization (EUA). This EUA will remain  in effect (meaning this test can be used) for the duration of the COVID-19 declaration under Section 564(b)(1) of the Act, 21 U.S.C.section  360bbb-3(b)(1), unless the authorization is terminated  or revoked sooner.       Influenza A by PCR NEGATIVE NEGATIVE Final   Influenza B by PCR NEGATIVE NEGATIVE Final    Comment: (NOTE) The Xpert Xpress SARS-CoV-2/FLU/RSV plus assay is intended as an aid in the diagnosis of influenza from Nasopharyngeal swab specimens and should not be used as a sole basis for treatment. Nasal washings and aspirates are unacceptable for Xpert Xpress SARS-CoV-2/FLU/RSV testing.  Fact Sheet for Patients: BloggerCourse.com  Fact Sheet for Healthcare Providers: SeriousBroker.it  This test is not yet approved or cleared by the [place] FDA and has been authorized for detection and/or diagnosis of SARS-CoV-2 by FDA under an Emergency Use Authorization (EUA). This EUA will remain in effect (meaning this test can be used) for the duration of the COVID-19 declaration under Section 564(b)(1) of the Act, 21 U.S.C. section 360bbb-3(b)(1), unless the authorization is terminated or revoked.  Performed at [facility], [address]  [place], [place] [address]   Resp Panel by RT-PCR (Flu A&B, Covid) Nasopharyngeal Swab     Status: Abnormal   Collection Time: 08/13/21  3:41 PM   Specimen: Nasopharyngeal Swab; Nasopharyngeal(NP) swabs in vial transport medium  Result Value Ref Range Status   SARS Coronavirus 2 by RT PCR NEGATIVE NEGATIVE Final    Comment: (NOTE) SARS-CoV-2 target nucleic acids are NOT DETECTED.  The SARS-CoV-2 RNA is generally detectable in upper respiratory specimens during the acute phase of infection. The lowest concentration of SARS-CoV-2 viral copies this assay can detect is 138 copies/mL. A negative result does not preclude SARS-Cov-2 infection and should not be used as the sole basis for treatment or other patient management decisions. A negative result may occur with  improper specimen collection/handling,  submission of specimen other than nasopharyngeal swab, presence of viral mutation(s) within the areas targeted by this assay, and inadequate number of viral copies(<138 copies/mL). A negative result must be combined with clinical observations, patient history, and epidemiological information. The expected result is Negative.  Fact Sheet for Patients:  BloggerCourse.com  Fact Sheet for Healthcare Providers:  SeriousBroker.it  This test is no t yet approved or cleared by the [place] FDA and  has been authorized for detection and/or diagnosis of SARS-CoV-2 by FDA under an Emergency Use Authorization (EUA). This EUA will remain  in effect (meaning this test can be used) for the duration of the COVID-19 declaration under Section 564(b)(1) of the Act, 21 U.S.C.section 360bbb-3(b)(1), unless the authorization is terminated  or revoked sooner.       Influenza A by PCR POSITIVE (A) NEGATIVE Final   Influenza B by PCR NEGATIVE NEGATIVE Final    Comment: (NOTE) The Xpert Xpress SARS-CoV-2/FLU/RSV plus assay is intended as an aid in the diagnosis of influenza from Nasopharyngeal swab specimens and should not be used as a sole basis for treatment. Nasal washings and aspirates are unacceptable for Xpert Xpress SARS-CoV-2/FLU/RSV testing.  Fact Sheet for Patients: BloggerCourse.com  Fact Sheet for Healthcare Providers: SeriousBroker.it  This test is not yet approved or cleared by the [place] FDA and has been authorized for detection and/or diagnosis of SARS-CoV-2 by FDA under an Emergency Use Authorization (EUA). This EUA will remain in effect (meaning this test can be used) for the duration of the COVID-19 declaration under Section 564(b)(1) of the Act, 21 U.S.C. section 360bbb-3(b)(1), unless the authorization is terminated or revoked.  Performed at [facility], [address], [place], [place] [address]       Time spent in discharge (includes decision making & examination of pt): 35 minutes  08/14/2021, 3:01 PM   Lonia Blood, MD [facility] Office  [phone]

## 2021-08-14 NOTE — Progress Notes (Signed)
Physical Therapy Treatment Patient Details Name: Micheal [name] MRN: [id number] DOB: 1955-12-20 Today's Date: 08/14/2021   History of Present Illness Micheal [name] is a 65 y.o. male with a PMH significant for frontotemporal dementia with behavioral disturbance, HTN.  They presented from nursing home to the ED on 08/05/2021 with change in baseline mental status . Admitted with encephalopathy and PE. 11/7 tested positive for flu.    PT Comments    The patient is calm today, able to participate with therapy to sit up with mod assistance. Ambulated with 2 handhold x 40'. Patient say on bed edge and fed himself breakfast. Assisted back to bed and replaced mitts and soft hand restraints per RN request.  Recommendations for follow up therapy are one component of a multi-disciplinary discharge planning process, led by the attending physician.  Recommendations may be updated based on patient status, additional functional criteria and insurance authorization.  Follow Up Recommendations  Other (comment) (return to [facility] care)     Assistance Recommended at Discharge Frequent or constant Supervision/Assistance  Equipment Recommendations       Recommendations for Other Services       Precautions / Restrictions Precautions Precautions: Fall Precaution Comments: advanced dementia Restrictions Weight Bearing Restrictions: No     Mobility  Bed Mobility   Bed Mobility: Supine to Sit;Sit to Supine     Supine to sit: Mod assist     General bed mobility comments: multimodal cues to place legs over bed, assisted then placed back ontoo bed, Tactile cues to roll and sit up with mod assistance. Multimodal cues to return to supine, tactile cue to lift legs.    Transfers Overall transfer level: Needs assistance Equipment used: 2 person hand held assist Transfers: Sit to/from Stand Sit to Stand: Mod assist;+2 safety/equipment;+2 physical assistance           General transfer comment:  Mod assist to rise from bed with 2 arm hold Mits in place. Patient initially took a step with right and indicated discomfort in left foot" Ow". patient subsequently took more steps without indication of L foot pain.    Ambulation/Gait Ambulation/Gait assistance: Min assist;+2 physical assistance;+2 safety/equipment Gait Distance (Feet): 40 Feet Assistive device: 2 person hand held assist Gait Pattern/deviations: Step-through pattern;Staggering left;Staggering right       General Gait Details: decr   Stairs             Wheelchair Mobility    Modified Rankin (Stroke Patients Only)       Balance     Sitting balance-Leahy Scale: Fair Sitting balance - Comments: once in sitting holds self at midline, sat x ~ 10 minutes to feed himself breakfast.   Standing balance support: No upper extremity supported;Reliant on assistive device for balance Standing balance-Leahy Scale: Poor Standing balance comment: able to maintain statc standing balance  - +2 assistance for safety                            Cognition Arousal/Alertness: Awake/alert Behavior During Therapy: Flat affect Overall Cognitive Status: History of cognitive impairments - at baseline                                 General Comments: patient verbal with sensible words at times, some jargon. Patient stated"Thank you both" ,probably for setting him up to eat breakfast. he ate every thing, fed himself  Exercises      General Comments        Pertinent Vitals/Pain Pain Assessment: Faces Faces Pain Scale: Hurts a little bit Pain Location: LLE with inital standing Pain Descriptors / Indicators: Discomfort Pain Intervention(s): Monitored during session    Home Living Family/patient expects to be discharged to:: Assisted living                        Prior Function            PT Goals (current goals can now be found in the care plan section) Progress towards PT  goals: Progressing toward goals    Frequency    Min 1X/week      PT Plan Current plan remains appropriate;Frequency needs to be updated    Co-evaluation PT/OT/SLP Co-Evaluation/Treatment: Yes Reason for Co-Treatment: For patient/therapist safety;Necessary to address cognition/behavior during functional activity PT goals addressed during session: Mobility/safety with mobility OT goals addressed during session: ADL's and self-care      AM-PAC PT "6 Clicks" Mobility   Outcome Measure  Help needed turning from your back to your side while in a flat bed without using bedrails?: A Lot Help needed moving from lying on your back to sitting on the side of a flat bed without using bedrails?: A Lot Help needed moving to and from a bed to a chair (including a wheelchair)?: A Lot Help needed standing up from a chair using your arms (e.g., wheelchair or bedside chair)?: A Lot Help needed to walk in hospital room?: A Lot Help needed climbing 3-5 steps with a railing? : Total 6 Click Score: 11    End of Session Equipment Utilized During Treatment: Gait belt Activity Tolerance: Patient tolerated treatment well Patient left: in bed;with call bell/phone within reach;with bed alarm set;with restraints reapplied Nurse Communication: Mobility status       Time: 4967-5916 PT Time Calculation (min) (ACUTE ONLY): 44 min  Charges:  $Gait Training: 8-22 mins                     Blanchard Kelch PT Acute Rehabilitation Services Pager [phone] Office [phone]    Rada Hay 08/14/2021, 1:15 PM

## 2021-08-14 NOTE — [place] FL2 (Signed)
  [place] MEDICAID FL2 LEVEL OF CARE SCREENING TOOL     IDENTIFICATION  Patient Name: Micheal [name] Birthdate: 03-01-1956 Sex: male Admission Date (Current Location): 08/05/2021  [facility] and [place] Number:  Producer, television/film/video and Address:  [facility],  [address], [place] [address]      Provider Number: [id number]  Attending Physician Name and Address:  Lonia Blood, MD  Relative Name and Phone Number:       Current Level of Care: Hospital Recommended Level of Care: Memory Care (SCU) Prior Approval Number:    Date Approved/Denied:   PASRR Number:    Discharge Plan: Other (Comment) [facility] SCU)    Current Diagnoses: Patient Active Problem List   Diagnosis Date Noted   Unstable gait 08/06/2021   Weakness    Frontotemporal dementia with behavioral disturbance (HCC) 03/01/2020   Dehydration with hypernatremia 03/01/2020   Essential hypertension 03/01/2020    Orientation RESPIRATION BLADDER Height & Weight     Self  Normal Incontinent Weight: 75.9 kg Height:  6' (182.9 cm)  BEHAVIORAL SYMPTOMS/MOOD NEUROLOGICAL BOWEL NUTRITION STATUS      Incontinent Diet (mechanical soft)  AMBULATORY STATUS COMMUNICATION OF NEEDS Skin   Supervision Verbally Normal                       Personal Care Assistance Level of Assistance  Bathing, Feeding, Dressing Bathing Assistance: Limited assistance Feeding assistance: Limited assistance Dressing Assistance: Limited assistance     Functional Limitations Info  Sight, Hearing, Speech Sight Info: Adequate Hearing Info: Adequate Speech Info: Adequate    SPECIAL CARE FACTORS FREQUENCY                       Contractures Contractures Info: Not present    Additional Factors Info  Code Status, Allergies Code Status Info: DNR Allergies Info: NKA           Current Medications (08/14/2021):  This is the current hospital active medication list Current Facility-Administered  Medications  Medication Dose Route Frequency Provider Last Rate Last Admin   acetaminophen (TYLENOL) tablet 650 mg  650 mg Oral Q4H PRN Charlsie Quest, MD   650 mg at 08/08/21 2116   apixaban (ELIQUIS) tablet 5 mg  5 mg Oral BID Lonia Blood, MD   5 mg at 08/13/21 2156   benztropine (COGENTIN) tablet 1 mg  1 mg Oral BID Lonia Blood, MD   1 mg at 08/13/21 2156   Or   benztropine mesylate (COGENTIN) injection 1 mg  1 mg Intramuscular BID Lonia Blood, MD   1 mg at 08/12/21 2253   LORazepam (ATIVAN) injection 0.5 mg  0.5 mg Intramuscular Q6H PRN Lonia Blood, MD       MEDLINE mouth rinse  15 mL Mouth Rinse BID Leeroy Bock, MD   15 mL at 08/13/21 2156     Discharge Medications: Please see discharge summary for a list of discharge medications.  Relevant Imaging Results:  Relevant Lab Results:   Additional Information SS# [id number]  Ida Rogue, [place]

## 2021-12-02 ENCOUNTER — Emergency Department (HOSPITAL_COMMUNITY): Payer: Medicare Other

## 2021-12-02 ENCOUNTER — Other Ambulatory Visit: Payer: Self-pay

## 2021-12-02 ENCOUNTER — Encounter (HOSPITAL_COMMUNITY): Payer: Self-pay

## 2021-12-02 ENCOUNTER — Emergency Department (HOSPITAL_COMMUNITY)
Admission: EM | Admit: 2021-12-02 | Discharge: 2021-12-02 | Disposition: A | Discharge status: Home / Self Care | Payer: Medicare Other | Attending: Emergency Medicine | Admitting: Emergency Medicine

## 2021-12-02 DIAGNOSIS — F0390 Unspecified dementia without behavioral disturbance: Secondary | ICD-10-CM | POA: Diagnosis not present

## 2021-12-02 DIAGNOSIS — I10 Essential (primary) hypertension: Secondary | ICD-10-CM | POA: Diagnosis not present

## 2021-12-02 DIAGNOSIS — S0101XA Laceration without foreign body of scalp, initial encounter: Secondary | ICD-10-CM

## 2021-12-02 DIAGNOSIS — W01198A Fall on same level from slipping, tripping and stumbling with subsequent striking against other object, initial encounter: Secondary | ICD-10-CM | POA: Diagnosis not present

## 2021-12-02 DIAGNOSIS — S0990XA Unspecified injury of head, initial encounter: Secondary | ICD-10-CM | POA: Diagnosis present

## 2021-12-02 DIAGNOSIS — Z7901 Long term (current) use of anticoagulants: Secondary | ICD-10-CM | POA: Insufficient documentation

## 2021-12-02 DIAGNOSIS — W19XXXA Unspecified fall, initial encounter: Secondary | ICD-10-CM

## 2021-12-02 NOTE — ED Notes (Signed)
Spoke with [facility] med tech who stated she would take information on patient coming back to facility as after multiple calls the nurse is unavailable for communication.

## 2021-12-02 NOTE — ED Provider Notes (Signed)
[facility]-EMERGENCY DEPT Provider Note   CSN: [id number] Arrival date & time: 12/02/21  6226     History  Chief Complaint  Patient presents with   Fall   Head Injury    Micheal [name] is a 66 y.o. male.  Micheal [name] is a 66 y.o. male with a history of hypertension, dementia, who presents to the emergency department from [facility] skilled nursing facility via EMS for evaluation after witnessed fall.  Per EMS nursing home staff reported that he had a witnessed fall after he lost his balance and slipped and hit his head on the left side with a small laceration above his left ear.  Facility did not witness any other injury.  Bleeding controlled at facility.  Per facility patient is at his baseline mental status, he will occasionally answer some questions but typically does not respond and is minimally verbal, most the time his responses to questions do not make sense.   Patient unable to provide any additional history, does not complain of pain anywhere.  On Plavix, no other anticoagulation  The history is provided by the patient, the EMS personnel and the nursing home.      Home Medications Prior to Admission medications   Medication Sig Start Date End Date Taking? Authorizing Provider  acetaminophen (TYLENOL) 325 MG tablet Take 2 tablets (650 mg total) by mouth every 4 (four) hours as needed for mild pain (or temp > 37.5 C (99.5 F)). 08/14/21   Lonia Blood, MD  alum & mag hydroxide-simeth (MAALOX/MYLANTA) 200-200-20 MG/5ML suspension Take 30 mLs by mouth every 6 (six) hours as needed for indigestion or heartburn.    [provider]  apixaban (ELIQUIS) 5 MG TABS tablet Take 1 tablet (5 mg total) by mouth 2 (two) times daily. 08/14/21   Lonia Blood, MD  benztropine (COGENTIN) 1 MG tablet Take 1 tablet (1 mg total) by mouth 2 (two) times daily. 08/14/21   Lonia Blood, MD  guaifenesin (ROBITUSSIN) 100 MG/5ML syrup Take 200 mg by  mouth every 6 (six) hours as needed for cough.    [provider]  haloperidol decanoate (HALDOL DECANOATE) 50 MG/ML injection Inject 50 mg into the muscle every 28 (twenty-eight) days.    [provider]  loperamide (IMODIUM) 2 MG capsule Take 2 mg by mouth as needed for diarrhea or loose stools (max 8 doses/ 24 hours).    [provider]  magnesium hydroxide (MILK OF MAGNESIA) 400 MG/5ML suspension Take 30 mLs by mouth at bedtime as needed (constipation).    [provider]  Neomycin-Bacitracin-Polymyxin (TRIPLE ANTIBIOTIC) [phone] OINT Apply 1 application topically daily as needed (minor skin tears/abrasians).    [provider]  polyethylene glycol (MIRALAX / GLYCOLAX) 17 g packet Take 17 g by mouth daily as needed for mild constipation. Patient taking differently: Take 17 g by mouth daily as needed (constipation). Mix in 8 oz liquid and drink 03/07/20   Zannie Cove, MD      Allergies    Patient has no known allergies.    Review of Systems   Review of Systems  Unable to perform ROS: Dementia   Physical Exam Updated Vital Signs BP (!) 134/94 (BP Location: Left Arm)    Pulse 81    Temp (!) 97.1 F (36.2 C) (Axillary)    Resp 14    Wt 72.6 kg    SpO2 100%    BMI 21.70 kg/m  Physical Exam Vitals and nursing  note reviewed.  Constitutional:      General: He is not in acute distress.    Appearance: Normal appearance. He is well-developed. He is not diaphoretic.     Comments: Alert and oriented to baseline, no acute distress  HENT:     Head: Normocephalic. Laceration present.     Comments: 2.5 cm laceration over the left temporal scalp just above the left ear, well approximated with bleeding controlled, small surrounding hematoma but no palpable step-off or deformity, no signs of head trauma elsewhere, negative battle sign    Ears:     Comments: No CSF otorrhea, no hemotympanum    Nose: Nose normal.     Comments: No nasal tenderness,  epistaxis or deformity    Mouth/Throat:     Mouth: Mucous membranes are moist.     Pharynx: Oropharynx is clear.     Comments: No tenderness or deformity over the jaw, mucous membranes moist Eyes:     General:        Right eye: No discharge.        Left eye: No discharge.     Extraocular Movements: Extraocular movements intact.     Pupils: Pupils are equal, round, and reactive to light.  Neck:     Comments: C-collar in place, no palpable step-off or deformity over the C-spine Cardiovascular:     Rate and Rhythm: Normal rate and regular rhythm.     Pulses: Normal pulses.     Heart sounds: Normal heart sounds.  Pulmonary:     Effort: Pulmonary effort is normal. No respiratory distress.     Breath sounds: Normal breath sounds. No wheezing or rales.     Comments: Respirations equal and unlabored, patient able to speak in full sentences, lungs clear to auscultation bilaterally  Chest:     Chest wall: No tenderness.  Abdominal:     General: Bowel sounds are normal. There is no distension.     Palpations: Abdomen is soft. There is no mass.     Tenderness: There is no abdominal tenderness. There is no guarding.     Comments: Abdomen soft, nondistended, nontender to palpation in all quadrants without guarding or peritoneal signs  Musculoskeletal:        General: No deformity.     Cervical back: Neck supple.     Comments: No step-off, deformity or noted tenderness over the midline thoracic or lumbar spine. All joints supple and easily movable, all compartments soft, no tenderness or instability over the pelvis  Skin:    General: Skin is warm and dry.     Capillary Refill: Capillary refill takes less than 2 seconds.  Neurological:     Mental Status: He is alert and oriented to person, place, and time.     Coordination: Coordination normal.     Comments: Patient minimally verbal, does not follow commands at baseline, No facial droop noted, exam is limited but patient appears to be  visually tracking staff throughout the room, PERRLA Normal strength in upper and lower extremities bilaterally including dorsiflexion and plantar flexion, strong and equal grip strength Moving all extremities independently  Psychiatric:        Mood and Affect: Mood normal.        Behavior: Behavior normal.    ED Results / Procedures / Treatments   Labs (all labs ordered are listed, but only abnormal results are displayed) Labs Reviewed - No data to display  EKG None  Radiology CT Head Wo Contrast  Result  Date: 12/02/2021 CLINICAL DATA:  Head trauma. Fell from standing position. Laceration the head. History of dementia. EXAM: CT HEAD WITHOUT CONTRAST CT CERVICAL SPINE WITHOUT CONTRAST TECHNIQUE: Multidetector CT imaging of the head and cervical spine was performed following the standard protocol without intravenous contrast. Multiplanar CT image reconstructions of the cervical spine were also generated. RADIATION DOSE REDUCTION: This exam was performed according to the departmental dose-optimization program which includes automated exposure control, adjustment of the mA and/or kV according to patient size and/or use of iterative reconstruction technique. COMPARISON:  08/05/2021, 07/03/2021 FINDINGS: CT HEAD FINDINGS Brain: There is significant central and cortical atrophy. Periventricular white matter changes are consistent with small vessel disease. Stable punctate calcifications within the LEFT centrum semiovale. There is no intra or extra-axial fluid collection or mass lesion. The basilar cisterns and ventricles have a normal appearance. There is no CT evidence for acute infarction or hemorrhage. Vascular: No hyperdense vessel or unexpected calcification. Skull: Normal. Negative for fracture or focal lesion. Sinuses/Orbits: No acute finding. Other: Patient motion artifact. Skin clips are present in the LEFT parietal scalp. No underlying calvarial fracture. CT CERVICAL SPINE FINDINGS Alignment:  There is loss of cervical lordosis. This may be secondary to splinting, soft tissue injury, or positioning. Otherwise alignment is normal. Skull base and vertebrae: No acute fracture. No primary bone lesion or focal pathologic process. Soft tissues and spinal canal: No prevertebral fluid or swelling. No visible canal hematoma. Disc levels: Moderate disc height loss and uncovertebral spurring at C4-5, C5-6. There is mild bilateral foraminal narrowing at C5-6 and C6-7. Upper chest: Negative. Other: None IMPRESSION: 1.  No evidence for acute intracranial abnormality. 2. LEFT parietal scalp laceration with skin clips. 3.  No evidence for acute cervical spine abnormality. 4. Loss of cervical lordosis.  Degenerative changes. Electronically Signed   By: Norva PavlovElizabeth  Brown M.D.   On: 12/02/2021 10:24   CT Cervical Spine Wo Contrast  Result Date: 12/02/2021 CLINICAL DATA:  Head trauma. Fell from standing position. Laceration the head. History of dementia. EXAM: CT HEAD WITHOUT CONTRAST CT CERVICAL SPINE WITHOUT CONTRAST TECHNIQUE: Multidetector CT imaging of the head and cervical spine was performed following the standard protocol without intravenous contrast. Multiplanar CT image reconstructions of the cervical spine were also generated. RADIATION DOSE REDUCTION: This exam was performed according to the departmental dose-optimization program which includes automated exposure control, adjustment of the mA and/or kV according to patient size and/or use of iterative reconstruction technique. COMPARISON:  08/05/2021, 07/03/2021 FINDINGS: CT HEAD FINDINGS Brain: There is significant central and cortical atrophy. Periventricular white matter changes are consistent with small vessel disease. Stable punctate calcifications within the LEFT centrum semiovale. There is no intra or extra-axial fluid collection or mass lesion. The basilar cisterns and ventricles have a normal appearance. There is no CT evidence for acute infarction or  hemorrhage. Vascular: No hyperdense vessel or unexpected calcification. Skull: Normal. Negative for fracture or focal lesion. Sinuses/Orbits: No acute finding. Other: Patient motion artifact. Skin clips are present in the LEFT parietal scalp. No underlying calvarial fracture. CT CERVICAL SPINE FINDINGS Alignment: There is loss of cervical lordosis. This may be secondary to splinting, soft tissue injury, or positioning. Otherwise alignment is normal. Skull base and vertebrae: No acute fracture. No primary bone lesion or focal pathologic process. Soft tissues and spinal canal: No prevertebral fluid or swelling. No visible canal hematoma. Disc levels: Moderate disc height loss and uncovertebral spurring at C4-5, C5-6. There is mild bilateral foraminal narrowing at C5-6 and C6-7.  Upper chest: Negative. Other: None IMPRESSION: 1.  No evidence for acute intracranial abnormality. 2. LEFT parietal scalp laceration with skin clips. 3.  No evidence for acute cervical spine abnormality. 4. Loss of cervical lordosis.  Degenerative changes. Electronically Signed   By: Norva PavlovElizabeth  Brown M.D.   On: 12/02/2021 10:24   DG Pelvis Portable  Result Date: 12/02/2021 CLINICAL DATA:  Trauma, fall EXAM: PORTABLE PELVIS 1-2 VIEWS COMPARISON:  None. FINDINGS: No recent displaced fracture or dislocation is seen. Degenerative changes are noted in both hips, more so on the left side. IMPRESSION: No recent displaced fracture is seen in the pelvis. Degenerative changes are noted in both hips, more so on the left side. Electronically Signed   By: Ernie AvenaPalani  Rathinasamy M.D.   On: 12/02/2021 10:00   DG Chest Port 1 View  Result Date: 12/02/2021 CLINICAL DATA:  Trauma, fall EXAM: PORTABLE CHEST 1 VIEW COMPARISON:  05/22/2021 FINDINGS: The heart size and mediastinal contours are within normal limits. Both lungs are clear. The visualized skeletal structures are unremarkable. IMPRESSION: No active disease. Electronically Signed   By: Ernie AvenaPalani   Rathinasamy M.D.   On: 12/02/2021 10:01    Procedures .Marland Kitchen.Laceration Repair  Date/Time: 12/02/2021 1:27 PM Performed by: Dartha LodgeFord, Sarabelle Genson N, PA-C Authorized by: Dartha LodgeFord, Candie Gintz N, PA-C   Consent:    Consent obtained:  Verbal   Consent given by:  Patient   Risks, benefits, and alternatives were discussed: yes     Risks discussed:  Infection, pain, need for additional repair and poor cosmetic result   Alternatives discussed:  No treatment Universal protocol:    Relevant documents present and verified: yes     Patient identity confirmed:  Arm band Anesthesia:    Anesthesia method:  None Laceration details:    Location:  Scalp   Scalp location:  L temporal   Length (cm):  2.5   Depth (mm):  3 Exploration:    Hemostasis achieved with:  Direct pressure   Wound extent: areolar tissue violated   Treatment:    Area cleansed with:  Saline Skin repair:    Repair method:  Staples   Number of staples:  2 Approximation:    Approximation:  Close Post-procedure details:    Procedure completion:  Tolerated well, no immediate complications    Medications Ordered in ED Medications - No data to display  ED Course/ Medical Decision Making/ A&P                           Medical Decision Making  This patient presents to the ED for concern of fall, head injury, laceration, this involves an extensive number of treatment options, and is a complaint that carries with it a high risk of complications and morbidity.  The differential diagnosis includes laceration, soft tissue hematoma, intracranial bleeding, skull fracture, C-spine fracture.  Patient without obvious other signs of trauma but history is very limited.   Co morbidities that complicate the patient evaluation  Dementia   Additional history obtained:  Additional history obtained from EMS, nursing home staff External records from outside source obtained and reviewed including SNF medication records, previous ED encounters  Considered  blood work but given mechanical nature of fall and patient with baseline mental status do not feel it would change management at this time  Imaging Studies ordered:  I ordered imaging studies including CT of the head and cervical spine as well as screening chest and pelvis x-rays given fall and  limited history I independently visualized and interpreted imaging which showed no evidence of intracranial bleed or fracture, no obvious cervical spine fracture or malalignment, chest and pelvis films unremarkable I agree with the radiologist interpretation   Cardiac Monitoring:  The patient was maintained on a cardiac monitor.  I personally viewed and interpreted the cardiac monitored which showed an underlying rhythm of: NSR   Medicines ordered and prescription drug management:  I have reviewed the patients home medicines, on Plavix, no other anticoagulation    Critical Interventions:  Wound care    Problem List / ED Course:  Patient with history of dementia had mechanical fall at skilled nursing facility with head injury and small laceration which was repaired with staples.  Fortunately imaging does not show any acute intracranial imaging and is otherwise reassuring, mental status at baseline per facility.    Social Determinants of Health:  Dementia, nonverbal   Dispostion:  After consideration of the diagnostic results and the patients response to treatment, I feel that the patent would benefit from discharge back to skilled nursing facility.          Final Clinical Impression(s) / ED Diagnoses Final diagnoses:  Fall, initial encounter  Laceration of scalp, initial encounter    Rx / DC Orders ED Discharge Orders     None         Legrand Rams 12/02/21 1343    Virgina Norfolk, DO 12/02/21 1448

## 2021-12-02 NOTE — Discharge Instructions (Addendum)
Imaging after fall was reassuring.  Laceration closed with staples, these will need to be removed in 7-10 days.  Monitor wound for signs of infection such as redness, swelling, increasing pain or drainage.

## 2021-12-02 NOTE — ED Triage Notes (Signed)
"  From nursing home, staff stated he fell from standing position. Laceration noted to head. Bleeding controlled. No other obvious injuries. History of dementia, answers some questions, but does not always respond to questions and this is consistent with his baseline" per EMS

## 2021-12-02 NOTE — ED Triage Notes (Signed)
"  Patient is on Plavix" per EMS

## 2021-12-02 NOTE — ED Notes (Signed)
[facility] called to schedule transport back to [facility]

## 2021-12-06 ENCOUNTER — Emergency Department (HOSPITAL_COMMUNITY): Payer: Medicare Other

## 2021-12-06 ENCOUNTER — Other Ambulatory Visit: Payer: Self-pay

## 2021-12-06 ENCOUNTER — Emergency Department (HOSPITAL_COMMUNITY)
Admission: EM | Admit: 2021-12-06 | Discharge: 2021-12-06 | Disposition: A | Discharge status: Skilled Nursing Facility | Payer: Medicare Other | Attending: Emergency Medicine | Admitting: Emergency Medicine

## 2021-12-06 DIAGNOSIS — Y92129 Unspecified place in nursing home as the place of occurrence of the external cause: Secondary | ICD-10-CM | POA: Diagnosis not present

## 2021-12-06 DIAGNOSIS — Z23 Encounter for immunization: Secondary | ICD-10-CM | POA: Diagnosis not present

## 2021-12-06 DIAGNOSIS — S065XAA Traumatic subdural hemorrhage with loss of consciousness status unknown, initial encounter: Secondary | ICD-10-CM | POA: Diagnosis not present

## 2021-12-06 DIAGNOSIS — F0280 Dementia in other diseases classified elsewhere without behavioral disturbance: Secondary | ICD-10-CM | POA: Insufficient documentation

## 2021-12-06 DIAGNOSIS — S0101XA Laceration without foreign body of scalp, initial encounter: Secondary | ICD-10-CM | POA: Insufficient documentation

## 2021-12-06 DIAGNOSIS — S01311A Laceration without foreign body of right ear, initial encounter: Secondary | ICD-10-CM | POA: Diagnosis not present

## 2021-12-06 DIAGNOSIS — Z7901 Long term (current) use of anticoagulants: Secondary | ICD-10-CM | POA: Diagnosis not present

## 2021-12-06 DIAGNOSIS — Y900 Blood alcohol level of less than 20 mg/100 ml: Secondary | ICD-10-CM | POA: Diagnosis not present

## 2021-12-06 DIAGNOSIS — G3109 Other frontotemporal dementia: Secondary | ICD-10-CM | POA: Insufficient documentation

## 2021-12-06 DIAGNOSIS — W06XXXA Fall from bed, initial encounter: Secondary | ICD-10-CM | POA: Diagnosis not present

## 2021-12-06 DIAGNOSIS — S0181XA Laceration without foreign body of other part of head, initial encounter: Secondary | ICD-10-CM | POA: Diagnosis present

## 2021-12-06 DIAGNOSIS — T1490XA Injury, unspecified, initial encounter: Secondary | ICD-10-CM

## 2021-12-06 DIAGNOSIS — Z20822 Contact with and (suspected) exposure to covid-19: Secondary | ICD-10-CM | POA: Insufficient documentation

## 2021-12-06 LAB — CBC WITH DIFFERENTIAL/PLATELET
Abs Immature Granulocytes: 0.02 10*3/uL (ref 0.00–0.07)
Basophils Absolute: 0 10*3/uL (ref 0.0–0.1)
Basophils Relative: 0 %
Eosinophils Absolute: 0 10*3/uL (ref 0.0–0.5)
Eosinophils Relative: 1 %
HCT: 42 % (ref 39.0–52.0)
Hemoglobin: 13.9 g/dL (ref 13.0–17.0)
Immature Granulocytes: 0 %
Lymphocytes Relative: 20 %
Lymphs Abs: 1.5 10*3/uL (ref 0.7–4.0)
MCH: 33 pg (ref 26.0–34.0)
MCHC: 33.1 g/dL (ref 30.0–36.0)
MCV: 99.8 fL (ref 80.0–100.0)
Monocytes Absolute: 0.5 10*3/uL (ref 0.1–1.0)
Monocytes Relative: 7 %
Neutro Abs: 5.2 10*3/uL (ref 1.7–7.7)
Neutrophils Relative %: 72 %
Platelets: 136 10*3/uL — ABNORMAL LOW (ref 150–400)
RBC: 4.21 MIL/uL — ABNORMAL LOW (ref 4.22–5.81)
RDW: 12.7 % (ref 11.5–15.5)
WBC: 7.2 10*3/uL (ref 4.0–10.5)
nRBC: 0 % (ref 0.0–0.2)

## 2021-12-06 LAB — URINALYSIS, ROUTINE W REFLEX MICROSCOPIC
Bilirubin Urine: NEGATIVE
Glucose, UA: NEGATIVE mg/dL
Hgb urine dipstick: NEGATIVE
Ketones, ur: NEGATIVE mg/dL
Leukocytes,Ua: NEGATIVE
Nitrite: NEGATIVE
Protein, ur: NEGATIVE mg/dL
Specific Gravity, Urine: 1.01 (ref 1.005–1.030)
pH: 7 (ref 5.0–8.0)

## 2021-12-06 LAB — I-STAT CHEM 8, ED
BUN: 19 mg/dL (ref 8–23)
Calcium, Ion: 1.1 mmol/L — ABNORMAL LOW (ref 1.15–1.40)
Chloride: 104 mmol/L (ref 98–111)
Creatinine, Ser: 0.8 mg/dL (ref 0.61–1.24)
Glucose, Bld: 98 mg/dL (ref 70–99)
HCT: 40 % (ref 39.0–52.0)
Hemoglobin: 13.6 g/dL (ref 13.0–17.0)
Potassium: 4.2 mmol/L (ref 3.5–5.1)
Sodium: 142 mmol/L (ref 135–145)
TCO2: 32 mmol/L (ref 22–32)

## 2021-12-06 LAB — APTT: aPTT: 29 seconds (ref 24–36)

## 2021-12-06 LAB — COMPREHENSIVE METABOLIC PANEL
ALT: 14 U/L (ref 0–44)
AST: 22 U/L (ref 15–41)
Albumin: 3.5 g/dL (ref 3.5–5.0)
Alkaline Phosphatase: 46 U/L (ref 38–126)
Anion gap: 5 (ref 5–15)
BUN: 15 mg/dL (ref 8–23)
CO2: 29 mmol/L (ref 22–32)
Calcium: 8.5 mg/dL — ABNORMAL LOW (ref 8.9–10.3)
Chloride: 106 mmol/L (ref 98–111)
Creatinine, Ser: 0.84 mg/dL (ref 0.61–1.24)
GFR, Estimated: >60 mL/min (ref >60)
Glucose, Bld: 100 mg/dL — ABNORMAL HIGH (ref 70–99)
Potassium: 4.2 mmol/L (ref 3.5–5.1)
Sodium: 140 mmol/L (ref 135–145)
Total Bilirubin: 1 mg/dL (ref 0.3–1.2)
Total Protein: 5.5 g/dL — ABNORMAL LOW (ref 6.5–8.1)

## 2021-12-06 LAB — RESP PANEL BY RT-PCR (FLU A&B, COVID) ARPGX2
Influenza A by PCR: NEGATIVE
Influenza B by PCR: NEGATIVE
SARS Coronavirus 2 by RT PCR: NEGATIVE

## 2021-12-06 LAB — TYPE AND SCREEN
ABO/RH(D): O POS
Antibody Screen: NEGATIVE

## 2021-12-06 LAB — LACTIC ACID, PLASMA: Lactic Acid, Venous: 1.2 mmol/L (ref 0.5–1.9)

## 2021-12-06 LAB — PROTIME-INR
INR: 1 (ref 0.8–1.2)
Prothrombin Time: 13.6 seconds (ref 11.4–15.2)

## 2021-12-06 LAB — ABO/RH: ABO/RH(D): O POS

## 2021-12-06 LAB — ETHANOL: Alcohol, Ethyl (B): <10 mg/dL (ref <10)

## 2021-12-06 MED ORDER — TETANUS-DIPHTH-ACELL PERTUSSIS 5-2.5-18.5 LF-MCG/0.5 IM SUSY
0.5000 mL | PREFILLED_SYRINGE | Freq: Once | INTRAMUSCULAR | Status: AC
  Administered 2021-12-06: 0.5 mL via INTRAMUSCULAR
  Filled 2021-12-06: qty 0.5

## 2021-12-06 NOTE — ED Notes (Signed)
Pt in CT at this time with TRN 

## 2021-12-06 NOTE — Progress Notes (Signed)
?   12/06/21 0340  ?Clinical Encounter Type  ?Visited With Patient not available  ?Visit Type Initial;ED;Trauma  ?Referral From Nurse  ?Consult/Referral To None  ? ?Chaplain responded to a level two trauma alert. Patient was being assessed and under going testing. I was unable to speak with him. There was no family with him. I advised the nurse that if family arrived to call me and I would return.  ? ?Valerie Roys ?[facility] ?[facility]  ?[phone] ?

## 2021-12-06 NOTE — ED Notes (Signed)
Pt placed in [place] J collar at this time  ?

## 2021-12-06 NOTE — Progress Notes (Signed)
Orthopedic Tech Progress Note ?Patient Details:  ?Micheal [name] ?Mar 11, 1956 ?[id number] ? ?Level 2 trauma ? ? Patient ID: Micheal [name], male   DOB: December 15, 1955, 65 y.o.   MRN: [id number] ? ?Micheal [name] ?12/06/2021, 4:26 AM ? ?

## 2021-12-06 NOTE — ED Provider Notes (Signed)
[facility] Provider Note   CSN: [id number] Arrival date & time: 12/06/21  0354     History  Chief Complaint  Patient presents with   Level 2 fall on thinners     Micheal GlassingBradford Granville is a 66 y.o. male.  66 yo M presents via EMS for fall on eliquis. Patient non-verbal, EMS states he was found next to bed with wheelchair in the room. Unk downtime. Reportedly DNR/DNI (confirmed with sister who stated that she was his HCPOA and this would be her wishes for him) but no official documentation. VS w/ EMS reportedly normal and stable. Laceration to right temple area and bleeding on right ear with likely laceration.     Home Medications Prior to Admission medications   Medication Sig Start Date End Date Taking? Authorizing Provider  acetaminophen (TYLENOL) 325 MG tablet Take 2 tablets (650 mg total) by mouth every 4 (four) hours as needed for mild pain (or temp > 37.5 C (99.5 F)). 08/14/21   Lonia BloodMcClung, Jeffrey T, MD  alum & mag hydroxide-simeth (MAALOX/MYLANTA) 200-200-20 MG/5ML suspension Take 30 mLs by mouth every 6 (six) hours as needed for indigestion or heartburn.    [provider]  apixaban (ELIQUIS) 5 MG TABS tablet Take 1 tablet (5 mg total) by mouth 2 (two) times daily. 08/14/21   Lonia BloodMcClung, Jeffrey T, MD  benztropine (COGENTIN) 1 MG tablet Take 1 tablet (1 mg total) by mouth 2 (two) times daily. 08/14/21   Lonia BloodMcClung, Jeffrey T, MD  guaifenesin (ROBITUSSIN) 100 MG/5ML syrup Take 200 mg by mouth every 6 (six) hours as needed for cough.    [provider]  haloperidol decanoate (HALDOL DECANOATE) 50 MG/ML injection Inject 50 mg into the muscle every 28 (twenty-eight) days.    [provider]  loperamide (IMODIUM) 2 MG capsule Take 2 mg by mouth as needed for diarrhea or loose stools (max 8 doses/ 24 hours).    [provider]  magnesium hydroxide (MILK OF MAGNESIA) 400 MG/5ML suspension Take 30 mLs by mouth at bedtime as  needed (constipation).    [provider]  Neomycin-Bacitracin-Polymyxin (TRIPLE ANTIBIOTIC) [phone] OINT Apply 1 application topically daily as needed (minor skin tears/abrasians).    [provider]  polyethylene glycol (MIRALAX / GLYCOLAX) 17 g packet Take 17 g by mouth daily as needed for mild constipation. Patient taking differently: Take 17 g by mouth daily as needed (constipation). Mix in 8 oz liquid and drink 03/07/20   Zannie CoveJoseph, Preetha, MD      Allergies    Patient has no known allergies.    Review of Systems   Review of Systems  Physical Exam Updated Vital Signs BP (!) 139/95    Pulse 75    Temp (!) 96.9 F (36.1 C) (Temporal)    Resp 15    Ht 6' (1.829 m)    Wt 73 kg    SpO2 100%    BMI 21.83 kg/m  Physical Exam Vitals and nursing note reviewed.  Constitutional:      Appearance: He is well-developed.  HENT:     Head: Normocephalic.     Comments: Laceration to pinna and inner ear  Stellate wound to right temporal area    Nose: Nose normal. No congestion or rhinorrhea.     Mouth/Throat:     Mouth: Mucous membranes are moist.     Pharynx: Oropharynx is clear.  Eyes:     Pupils: Pupils are equal, round, and reactive to  light.  Cardiovascular:     Rate and Rhythm: Normal rate.  Pulmonary:     Effort: Pulmonary effort is normal. No respiratory distress.  Abdominal:     General: Abdomen is flat. There is no distension.  Musculoskeletal:        General: Normal range of motion.     Cervical back: Normal range of motion.  Skin:    General: Skin is warm and dry.  Neurological:     Mental Status: He is alert. Mental status is at baseline.     Motor: No weakness.    ED Results / Procedures / Treatments   Labs (all labs ordered are listed, but only abnormal results are displayed) Labs Reviewed  RESP PANEL BY RT-PCR (FLU A&B, COVID) [id number]  COMPREHENSIVE METABOLIC PANEL  CBC  ETHANOL  URINALYSIS, ROUTINE W REFLEX MICROSCOPIC  LACTIC ACID,  PLASMA  PROTIME-INR  APTT  CBC WITH DIFFERENTIAL/PLATELET  I-STAT CHEM 8, ED  SAMPLE TO BLOOD BANK  TYPE AND SCREEN    EKG None  Radiology DG Chest Port 1 View  Result Date: 12/06/2021 CLINICAL DATA:  66 year old male status post fall out of bed on blood thinners. EXAM: PORTABLE CHEST 1 VIEW COMPARISON:  Portable chest 12/02/2021 and earlier. FINDINGS: Portable AP views at 0407 hours. Evidence of chronic pulmonary hyperinflation. The patient is mildly rotated to the right similar to last month. Mediastinal contours remain within normal limits. Visualized tracheal air column is within normal limits. No pneumothorax, pleural effusion or acute pulmonary opacity. No acute osseous abnormality identified. Negative visible bowel gas. IMPRESSION: No acute cardiopulmonary abnormality or acute traumatic injury identified. Electronically Signed   By: Odessa Fleming M.D.   On: 12/06/2021 04:22    Procedures .Critical Care Performed by: Marily Memos, MD Authorized by: Marily Memos, MD   Critical care provider statement:    Critical care time (minutes):  30   Critical care was necessary to treat or prevent imminent or life-threatening deterioration of the following conditions:  Trauma   Critical care was time spent personally by me on the following activities:  Development of treatment plan with patient or surrogate, discussions with consultants, evaluation of patient's response to treatment, examination of patient, ordering and review of laboratory studies, ordering and review of radiographic studies, ordering and performing treatments and interventions, pulse oximetry, re-evaluation of patient's condition and review of old charts .Marland KitchenLaceration Repair  Date/Time: 12/06/2021 7:13 AM Performed by: Marily Memos, MD Authorized by: Marily Memos, MD   Consent:    Consent obtained:  Verbal   Consent given by:  Patient   Risks discussed:  Infection, need for additional repair, nerve damage, poor wound  healing, poor cosmetic result, pain, retained foreign body, tendon damage and vascular damage   Alternatives discussed:  No treatment, delayed treatment and observation Universal protocol:    Procedure explained and questions answered to patient or proxy's satisfaction: yes     Relevant documents present and verified: yes     Site/side marked: yes     Patient identity confirmed:  Hospital-assigned identification number and arm band Anesthesia:    Anesthesia method:  None Laceration details:    Location:  Scalp   Scalp location:  R temporal   Wound length (cm): stellate shape - total 3cm. Pre-procedure details:    Preparation:  Patient was prepped and draped in usual sterile fashion and imaging obtained to evaluate for foreign bodies Exploration:    Imaging obtained comment:  Ct   Imaging outcome: foreign  body not noted     Wound exploration: wound explored through full range of motion   Treatment:    Area cleansed with:  Saline   Amount of cleaning:  Extensive   Irrigation solution:  Sterile water   Irrigation volume:  100   Irrigation method:  Syringe Skin repair:    Repair method:  Tissue adhesive Approximation:    Approximation:  Close Repair type:    Repair type:  Simple Post-procedure details:    Dressing:  Open (no dressing)   Procedure completion:  Tolerated well, no immediate complications .Marland KitchenLaceration Repair  Date/Time: 12/06/2021 7:14 AM Performed by: Marily Memos, MD Authorized by: Marily Memos, MD   Consent:    Consent obtained:  Verbal   Consent given by:  Patient   Risks discussed:  Infection, need for additional repair, nerve damage, poor wound healing, poor cosmetic result, pain, retained foreign body, tendon damage and vascular damage   Alternatives discussed:  No treatment, delayed treatment and observation Universal protocol:    Procedure explained and questions answered to patient or proxy's satisfaction: yes     Relevant documents present and  verified: yes     Site/side marked: yes     Patient identity confirmed:  Hospital-assigned identification number and arm band Anesthesia:    Anesthesia method:  None Laceration details:    Location:  Ear   Ear location:  R ear   Length (cm):  2   Depth (mm):  4 Pre-procedure details:    Preparation:  Patient was prepped and draped in usual sterile fashion and imaging obtained to evaluate for foreign bodies Exploration:    Limited defect created (wound extended): no     Imaging obtained comment:  Ct   Wound exploration: wound explored through full range of motion   Treatment:    Area cleansed with:  Saline   Amount of cleaning:  Extensive   Irrigation solution:  Sterile water   Irrigation volume:  50   Irrigation method:  Syringe   Debridement:  None   Undermining:  None   Scar revision: no   Skin repair:    Repair method:  Sutures   Suture size:  5-0   Suture material:  Fast-absorbing gut   Suture technique:  Simple interrupted   Number of sutures:  3 Approximation:    Approximation:  Close (sutured outer part of ear. inner part laceration well approximated and patient wouldn't tolerate repair so left to heal on own) Repair type:    Repair type:  Simple Post-procedure details:    Dressing:  Open (no dressing)   Procedure completion:  Tolerated well, no immediate complications    Medications Ordered in ED Medications  Tdap (BOOSTRIX) injection 0.5 mL (0.5 mLs Intramuscular Given 12/06/21 0519)    ED Course/ Medical Decision Making/ A&P                           Medical Decision Making Amount and/or Complexity of Data Reviewed Labs: ordered. Radiology: ordered. ECG/medicine tests: ordered.  Risk Prescription drug management.   Patient has frontotemporal dementia and has been in nursing home for quite a while.  His wife recently died and his sisters not become his healthcare power of attorney whom I spoke with throughout his encounter.  She was very cognizant of  his declining mental status and physical abilities.  States he is prone to falling.  Was started on blood thinners a few months ago  for a blood clot.  Is unclear whether or not he is still on those.  He had a fall tonight and subsequently CT scan shows a subacute subdural hematoma.  He did fall 4 days ago as well with a CT that time was negative but very well could be a delayed bleed from that fall.  There is a bit of midline shift.  Did not officially consult neurosurgery but spoke to them on the phone and the injury as it is is not surgical.  He stated that if the family would consider surgery for this and hospitalization for observation that he would make further recommendations at that point.  He suggested that I speak with the family regarding that but did suggest that we stop the blood thinners either way.  I reengaged with his sister, Ms. Micheal [name], after answering her questions and giving her my opinion she did not feel that neurosurgical intervention would be in his best wishes.  She states that he oftentimes gets very agitated in the hospital and being a fall risk she suggested that we not repeat a CT scan but to discharge him back to the facility.  I felt like this was appropriate and agreed with her decision.  We also talked about stopping the Eliquis and she agreed with that understanding that his blood clots could get worse or you develop new blood clots and could die from this.  However the risk of being on it with multiple falls which we know is going to happen is likely higher than the benefit.  Over the phone she seemed to understand these risks and agreed with discharging back to the facility.  Final Clinical Impression(s) / ED Diagnoses Final diagnoses:  Trauma    Rx / DC Orders ED Discharge Orders     None         Brittain Smithey, Barbara Cower, MD 12/06/21 [phone]

## 2021-12-06 NOTE — ED Notes (Addendum)
Trauma Response Nurse Documentation ? ? ?Micheal [name] is a 66 y.Micheal. male arriving to [facility] ED via EMS ? ?On clopidogrel 75 mg daily. Trauma was activated as a Level 2 by ED charge RN based on the following trauma criteria Elderly patients > 65 with head trauma on anti-coagulation (excluding ASA). Trauma team at the bedside on patient arrival. Patient cleared for CT by Dr. Clayborne Dana EDP. Patient to CT with team. GCS 11 (E4 V 2 M5). ? ?History  ? Past Medical History:  ?Diagnosis Date  ? Hypertension   ?  ? No past surgical history on file.  ?EMS reports dementia ? ? ?Initial Focused Assessment (If applicable, or please see trauma documentation): ?Laceration right temple, bleeding controlled ?Laceration right ear lobe, actively bleeding, quick clot and pressure dressing applied ?EMS c-collar in place, changed for [facility] for comfort ? ?CT's Completed:   ?CT Head and CT C-Spine  ? ?Interventions:  ?Portable chest and pelvis XRAY ?IV start and trauma lab draw ?TDAP ?COVID swab ?CTs as above ?Laceration repair, wound care ?[place] c-collar ? ?Plan for disposition:  ?Pending at this time ? ?Consults completed:  ?none at the time of this note. ? ?Event Summary: ?Patient arrives via EMS from assisted living facility after an unwitnessed fall with unknown downtime. Takes plavix. Laceration to right temple with bleeding controlled, actively bleeding laceration to right ear without  improvement from quick clot and pressure dressing. Portable XRAYs and CTs completed, delay in blood draw d/t difficult IV access. Dispo pending imaging at this time, laceration repair pending. ? ?MTP Summary (If applicable): NA ? ?Bedside handoff with ED RN Alcario Drought.   ? ?Micheal [name] Micheal [name]  ?Trauma Response RN ? ?Please call TRN at [phone] for further assistance. ?  ?

## 2021-12-06 NOTE — ED Notes (Signed)
[facility] WAS CALLED NO ETA GIVEN  ?

## 2021-12-06 NOTE — ED Triage Notes (Signed)
Pt BIB EMS from [facility] - fall on thinners - pt takes plavix - per EMS pt fell getting out of bed - unknown amount of time down - pt has lac to right side of head/earlobe - bleeding is controlled.  ?Pt has baseline neurocognitive disorder. Unknown why pt is on Plavix  ?Hx of HTN  ?

## 2021-12-06 NOTE — TOC CAGE-AID Note (Signed)
Transition of Care (TOC) - CAGE-AID Screening ? ? ?Patient Details  ?Name: Micheal [name] ?MRN: [id number] ?Date of Birth: 1956-10-02 ? ?Transition of Care (TOC) CM/SW Contact:    ?Katha Hamming, RN ?Phone Number:[phone] ?12/06/2021, 5:40 AM ? ? ?Clinical Narrative: ? ?Patient arrives via EMS from assisted living facility, unwitnessed fall, found down with unknown downtime. Subacute subdural new from last scan 2/26. Unable to participate in screening, GCS11, doesn't answer questions. Per EMS report, this is patient's baseline. ? ?CAGE-AID Screening: ?Substance Abuse Screening unable to be completed due to: : Patient unable to participate (d/t dementia, unable to answer questions - per EMS report, this is baseline) ? ?  ?  ?  ?  ?  ? ?  ? ?  ? ? ? ? ? ? ?

## 2021-12-06 NOTE — Discharge Instructions (Addendum)
Patient has a small amount of bleeding in his head. It is difficult to tell if this is new or old but seems to be not from today. After discussion with neurosurgery we are recommending stopping the eliquis as the risk of being on it is higher than the risk of being off of it since he is prone to falling.  ?After discussion with his sister, Ms. Wesson, we have decided to forego further workup/imaging of the bleeding in his head as it is unlikely that they would want surgery if things progressed. Stopping the Eliquis certainly increases his risk of worsening blood clot burden as well but does not seem to be in the best interest of the patient at this time.  ?He has glue to the wound on his scalp and absorbable stitches to wound in his ear, these will come off with time.  ?

## 2022-01-07 ENCOUNTER — Emergency Department (HOSPITAL_COMMUNITY): Payer: Medicare Other

## 2022-01-07 ENCOUNTER — Other Ambulatory Visit: Payer: Self-pay

## 2022-01-07 ENCOUNTER — Emergency Department (HOSPITAL_COMMUNITY)
Admission: EM | Admit: 2022-01-07 | Discharge: 2022-01-07 | Disposition: A | Discharge status: Home / Self Care | Payer: Medicare Other | Attending: Emergency Medicine | Admitting: Emergency Medicine

## 2022-01-07 ENCOUNTER — Encounter (HOSPITAL_COMMUNITY): Payer: Self-pay | Admitting: Radiology

## 2022-01-07 DIAGNOSIS — W19XXXA Unspecified fall, initial encounter: Secondary | ICD-10-CM | POA: Diagnosis not present

## 2022-01-07 DIAGNOSIS — S0101XA Laceration without foreign body of scalp, initial encounter: Secondary | ICD-10-CM | POA: Insufficient documentation

## 2022-01-07 DIAGNOSIS — F0390 Unspecified dementia without behavioral disturbance: Secondary | ICD-10-CM | POA: Insufficient documentation

## 2022-01-07 DIAGNOSIS — Z7902 Long term (current) use of antithrombotics/antiplatelets: Secondary | ICD-10-CM | POA: Insufficient documentation

## 2022-01-07 DIAGNOSIS — S065X0A Traumatic subdural hemorrhage without loss of consciousness, initial encounter: Secondary | ICD-10-CM | POA: Insufficient documentation

## 2022-01-07 DIAGNOSIS — S0990XA Unspecified injury of head, initial encounter: Secondary | ICD-10-CM | POA: Diagnosis present

## 2022-01-07 HISTORY — DX: Mood disorder due to known physiological condition, unspecified: F06.30

## 2022-01-07 HISTORY — DX: Other frontotemporal neurocognitive disorder: G31.09

## 2022-01-07 HISTORY — DX: Anxiety disorder due to known physiological condition: F06.4

## 2022-01-07 HISTORY — DX: Dementia in other diseases classified elsewhere, unspecified severity, without behavioral disturbance, psychotic disturbance, mood disturbance, and anxiety: F02.80

## 2022-01-07 HISTORY — DX: Adjustment insomnia: F51.02

## 2022-01-07 HISTORY — DX: Major depressive disorder, recurrent, unspecified: F33.9

## 2022-01-07 LAB — PROTIME-INR
INR: 1 (ref 0.8–1.2)
Prothrombin Time: 13.5 seconds (ref 11.4–15.2)

## 2022-01-07 LAB — CBC WITH DIFFERENTIAL/PLATELET
Abs Immature Granulocytes: 0.02 10*3/uL (ref 0.00–0.07)
Basophils Absolute: 0 10*3/uL (ref 0.0–0.1)
Basophils Relative: 0 %
Eosinophils Absolute: 0 10*3/uL (ref 0.0–0.5)
Eosinophils Relative: 0 %
HCT: 37 % — ABNORMAL LOW (ref 39.0–52.0)
Hemoglobin: 12.3 g/dL — ABNORMAL LOW (ref 13.0–17.0)
Immature Granulocytes: 0 %
Lymphocytes Relative: 16 %
Lymphs Abs: 1.3 10*3/uL (ref 0.7–4.0)
MCH: 33.2 pg (ref 26.0–34.0)
MCHC: 33.2 g/dL (ref 30.0–36.0)
MCV: 100 fL (ref 80.0–100.0)
Monocytes Absolute: 0.6 10*3/uL (ref 0.1–1.0)
Monocytes Relative: 8 %
Neutro Abs: 6.1 10*3/uL (ref 1.7–7.7)
Neutrophils Relative %: 76 %
Platelets: 141 10*3/uL — ABNORMAL LOW (ref 150–400)
RBC: 3.7 MIL/uL — ABNORMAL LOW (ref 4.22–5.81)
RDW: 13 % (ref 11.5–15.5)
WBC: 8.1 10*3/uL (ref 4.0–10.5)
nRBC: 0 % (ref 0.0–0.2)

## 2022-01-07 LAB — BASIC METABOLIC PANEL
Anion gap: 5 (ref 5–15)
BUN: 17 mg/dL (ref 8–23)
CO2: 29 mmol/L (ref 22–32)
Calcium: 8.2 mg/dL — ABNORMAL LOW (ref 8.9–10.3)
Chloride: 103 mmol/L (ref 98–111)
Creatinine, Ser: 0.65 mg/dL (ref 0.61–1.24)
GFR, Estimated: >60 mL/min (ref >60)
Glucose, Bld: 94 mg/dL (ref 70–99)
Potassium: 3.6 mmol/L (ref 3.5–5.1)
Sodium: 137 mmol/L (ref 135–145)

## 2022-01-07 LAB — CK: Total CK: 138 U/L (ref 49–397)

## 2022-01-07 NOTE — ED Notes (Signed)
Full linen change completed and brief changed.  ?

## 2022-01-07 NOTE — ED Notes (Signed)
Report given to [facility] @ [address].  She verbalizes understanding of medication updates and follow-up instructions.  ?

## 2022-01-07 NOTE — ED Notes (Addendum)
Pt's History updated per facility paperwork.  ?

## 2022-01-07 NOTE — ED Notes (Signed)
ED Provider at bedside. 

## 2022-01-07 NOTE — ED Notes (Signed)
[facility] called for transport back to [facility].  

## 2022-01-07 NOTE — ED Notes (Signed)
Report given to transport.  All paperwork and belongings given to Transport.  ?

## 2022-01-07 NOTE — ED Provider Notes (Signed)
?[facility]-EMERGENCY DEPT ?Provider Note ? ? ?CSN: [id number] ?Arrival date & time: 01/07/22  [id number] ? ?  ? ?History ? ?Chief Complaint  ?Patient presents with  ? Fall  ? ? ?Micheal [name] is a 66 y.o. male. ? ? ?Fall ? ?[facility] who confirms the patient is at or approximately at his mental baseline.  Apparently he sometimes talks but rarely responds to questions.  Will occasionally zone out and not respond to questions at all. ? ?Seems that he was seen sometime around 4:40 AM by overnight staff and around 5 AM was found on the ground without significant blood around him but with some blood coming from his scalp ? ?Per EMS he has had normal vital signs during transportation and was somewhat interactive.  No medications in route ? ?Level 5 caveat due to dementia  ? ? ? ?I did also discuss with patient's Sister Micheal [name] who communicates to me that patient's mental status is perhaps somewhat worse than what the assisted living facility communicated. ?Seems that he will speak sometimes however is not particularly communicative and does not have a conversation but rather will say words or laugh when you speak words to him. ? ?  ? ?Home Medications ?Prior to Admission medications   ?Medication Sig Start Date End Date Taking? Authorizing Provider  ?acetaminophen (TYLENOL) 325 MG tablet Take 2 tablets (650 mg total) by mouth every 4 (four) hours as needed for mild pain (or temp > 37.5 C (99.5 F)). 08/14/21   Lonia BloodMcClung, Jeffrey T, MD  ?acetaminophen (TYLENOL) 500 MG tablet Take 500 mg by mouth every 6 (six) hours as needed for headache, fever or mild pain.    [provider]  ?alum & mag hydroxide-simeth (MAALOX/MYLANTA) 200-200-20 MG/5ML suspension Take 30 mLs by mouth every 6 (six) hours as needed for indigestion or heartburn.    [provider]  ?benztropine (COGENTIN) 1 MG tablet Take 1 tablet (1 mg total) by mouth 2 (two) times daily. 08/14/21   Lonia BloodMcClung,  Jeffrey T, MD  ?clopidogrel (PLAVIX) 75 MG tablet Take 75 mg by mouth daily.    [provider]  ?guaifenesin (ROBITUSSIN) 100 MG/5ML syrup Take 200 mg by mouth every 6 (six) hours as needed for cough.    [provider]  ?haloperidol decanoate (HALDOL DECANOATE) 50 MG/ML injection Inject 50 mg into the muscle every 28 (twenty-eight) days.    [provider]  ?loperamide (IMODIUM) 2 MG capsule Take 2 mg by mouth as needed for diarrhea or loose stools (max 8 doses/ 24 hours).    [provider]  ?magnesium hydroxide (MILK OF MAGNESIA) 400 MG/5ML suspension Take 30 mLs by mouth at bedtime as needed (constipation).    [provider]  ?Neomycin-Bacitracin-Polymyxin (TRIPLE ANTIBIOTIC) [phone] OINT Apply 1 application topically daily as needed (minor skin tears/abrasians).    [provider]  ?NON FORMULARY Take 1 Can by mouth 3 (three) times daily.    [provider]  ?nystatin powder Apply 1 application topically 2 (two) times daily.    [provider]  ?polyethylene glycol (MIRALAX / GLYCOLAX) 17 g packet Take 17 g by mouth daily as needed for mild constipation. ?Patient taking differently: Take 17 g by mouth daily as needed (constipation). 03/07/20   Zannie CoveJoseph, Preetha, MD  ?   ? ?Allergies    ?Patient has no known allergies.   ? ?Review of Systems   ?Review of Systems ? ?Physical Exam ?Updated Vital Signs ?  BP 135/81   Pulse 63   Temp (!) 97.4 ?F (36.3 ?C) (Axillary)   Resp 14   Ht 6' (1.829 m)   Wt 73 kg   SpO2 99%   BMI 21.83 kg/m?  ?Physical Exam ?Vitals and nursing note reviewed.  ?Constitutional:   ?   General: He is not in acute distress. ?HENT:  ?   Head: Normocephalic and atraumatic.  ?   Nose: Nose normal.  ?   Mouth/Throat:  ?   Mouth: Mucous membranes are moist.  ?Eyes:  ?   General: No scleral icterus. ?Cardiovascular:  ?   Rate and Rhythm: Normal rate and regular rhythm.  ?   Pulses: Normal pulses.  ?   Heart sounds: Normal  heart sounds.  ?Pulmonary:  ?   Effort: Pulmonary effort is normal. No respiratory distress.  ?   Breath sounds: Normal breath sounds. No wheezing.  ?Abdominal:  ?   Palpations: Abdomen is soft.  ?   Tenderness: There is no abdominal tenderness. There is no guarding or rebound.  ?Musculoskeletal:  ?   Cervical back: Normal range of motion.  ?   Right lower leg: No edema.  ?   Left lower leg: No edema.  ?Skin: ?   General: Skin is warm and dry.  ?   Capillary Refill: Capillary refill takes less than 2 seconds.  ?   Comments: Small skin puncture at scalp (right frontal/parietal)  ?Neurological:  ?   Mental Status: He is alert. Mental status is at baseline.  ?Psychiatric:     ?   Mood and Affect: Mood normal.     ?   Behavior: Behavior normal.  ? ? ?ED Results / Procedures / Treatments   ?Labs ?(all labs ordered are listed, but only abnormal results are displayed) ?Labs Reviewed  ?CBC WITH DIFFERENTIAL/PLATELET - Abnormal; Notable for the following components:  ?    Result Value  ? RBC 3.70 (*)   ? Hemoglobin 12.3 (*)   ? HCT 37.0 (*)   ? Platelets 141 (*)   ? All other components within normal limits  ?BASIC METABOLIC PANEL - Abnormal; Notable for the following components:  ? Calcium 8.2 (*)   ? All other components within normal limits  ?CK  ?PROTIME-INR  ? ? ?EKG ?EKG Interpretation ? ?Date/Time:  Monday January 07 2022 06:56:23 EDT ?Ventricular Rate:  67 ?PR Interval:  154 ?QRS Duration: 99 ?QT Interval:  409 ?QTC Calculation: 432 ?R Axis:   78 ?Text Interpretation: Sinus rhythm Confirmed by Zadie Rhine ([id number]) on 01/07/2022 7:03:21 AM ? ?Radiology ?DG Chest 2 View ? ?Result Date: 01/07/2022 ?CLINICAL DATA:  66 year old male with history of syncope. EXAM: CHEST - 2 VIEW COMPARISON:  Chest x-ray 12/06/2021. FINDINGS: Lung volumes are normal. No consolidative airspace disease. No pleural effusions. No pneumothorax. No pulmonary nodule or mass noted. Pulmonary vasculature and the cardiomediastinal silhouette are within  normal limits. IMPRESSION: No radiographic evidence of acute cardiopulmonary disease. Electronically Signed   By: Trudie Reed M.D.   On: 01/07/2022 07:36  ? ?DG Elbow Complete Left ? ?Result Date: 01/07/2022 ?CLINICAL DATA:  66 year old male with history of trauma from a fall complaining of left elbow pain. EXAM: LEFT ELBOW - COMPLETE 3+ VIEW COMPARISON:  No priors. FINDINGS: There is no evidence of fracture, dislocation, or joint effusion. Mild degenerative changes are noted in the elbow joint. Soft tissues are unremarkable. IMPRESSION: 1. No acute radiographic abnormality of the left elbow. Electronically Signed  By: Trudie Reed M.D.   On: 01/07/2022 07:38  ? ?CT HEAD WO CONTRAST ( ) ? ?Result Date: 01/07/2022 ?CLINICAL DATA:  Larey Seat, head laceration EXAM: CT HEAD WITHOUT CONTRAST TECHNIQUE: Contiguous axial images were obtained from the base of the skull through the vertex without intravenous contrast. RADIATION DOSE REDUCTION: This exam was performed according to the departmental dose-optimization program which includes automated exposure control, adjustment of the mA and/or kV according to patient size and/or use of iterative reconstruction technique. COMPARISON:  12/06/2021 FINDINGS: Brain: Mixed attenuation left subdural hematoma, increased from previous, with new hyperdense acute components. This measures up to 2 cm in thickness (previously 6 mm) and is centered in the right left posterior frontal region extending to the temporoparietal regions. 4 mm midline shift from left to right (previously 2 mm) without subfalcine herniation or hydrocephalus. Chronic hyperdense white matter focus in the left cerebral hemisphere is stable, a portion almost certainly calcification. Vascular: No hyperdense vessel or unexpected calcification. Skull: Normal. Negative for fracture or focal lesion. Sinuses/Orbits: Stable cysts or polyps in bilateral frontal sinuses. No acute findings. Other: None IMPRESSION: 1. 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 Critical Value/emergent results were called by telephone at the time of interpretation on 01/07/2022 at 8:11 am to

## 2022-01-07 NOTE — ED Triage Notes (Signed)
Patient BIB EMS from SNF [facility]) c/o unwitness Fall. Patient was found in the floor by the staff. Pt sustain a head laceration and a skin tear on his elbow. Bleeding controlled upon EMS arrival. Pt hx dementia.  ?

## 2022-01-07 NOTE — Discharge Instructions (Addendum)
You will need to follow-up at [facility] in 2 weeks.  Return to the emergency room for any new or concerning symptoms specifically and intractable vomiting. ? ?You will need to hold Plavix and not take this medication for 2 weeks until you are seen by neurosurgery.  ? ?It may be reasonable to consider holding off on getting Haldol if patient is having falls related to Haldol administration. ?

## 2022-01-18 ENCOUNTER — Other Ambulatory Visit: Payer: Self-pay

## 2022-01-18 ENCOUNTER — Emergency Department (HOSPITAL_COMMUNITY): Payer: Medicare Other

## 2022-01-18 ENCOUNTER — Inpatient Hospital Stay (HOSPITAL_COMMUNITY)
Admission: EM | Admit: 2022-01-18 | Discharge: 2022-01-30 | DRG: 871 | Disposition: A | Discharge status: Home / Self Care | Payer: Medicare Other | Source: Skilled Nursing Facility | Attending: Family Medicine | Admitting: Family Medicine

## 2022-01-18 DIAGNOSIS — E43 Unspecified severe protein-calorie malnutrition: Secondary | ICD-10-CM | POA: Diagnosis present

## 2022-01-18 DIAGNOSIS — F0280 Dementia in other diseases classified elsewhere without behavioral disturbance: Secondary | ICD-10-CM

## 2022-01-18 DIAGNOSIS — F064 Anxiety disorder due to known physiological condition: Secondary | ICD-10-CM | POA: Diagnosis present

## 2022-01-18 DIAGNOSIS — Z86718 Personal history of other venous thrombosis and embolism: Secondary | ICD-10-CM

## 2022-01-18 DIAGNOSIS — F329 Major depressive disorder, single episode, unspecified: Secondary | ICD-10-CM | POA: Diagnosis present

## 2022-01-18 DIAGNOSIS — Z7189 Other specified counseling: Secondary | ICD-10-CM | POA: Diagnosis not present

## 2022-01-18 DIAGNOSIS — Z681 Body mass index (BMI) 19 or less, adult: Secondary | ICD-10-CM

## 2022-01-18 DIAGNOSIS — E162 Hypoglycemia, unspecified: Secondary | ICD-10-CM | POA: Diagnosis not present

## 2022-01-18 DIAGNOSIS — Z86711 Personal history of pulmonary embolism: Secondary | ICD-10-CM

## 2022-01-18 DIAGNOSIS — I6201 Nontraumatic acute subdural hemorrhage: Secondary | ICD-10-CM

## 2022-01-18 DIAGNOSIS — F0283 Dementia in other diseases classified elsewhere, unspecified severity, with mood disturbance: Secondary | ICD-10-CM | POA: Diagnosis present

## 2022-01-18 DIAGNOSIS — A4102 Sepsis due to Methicillin resistant Staphylococcus aureus: Principal | ICD-10-CM | POA: Diagnosis present

## 2022-01-18 DIAGNOSIS — G3109 Other frontotemporal dementia: Secondary | ICD-10-CM | POA: Diagnosis present

## 2022-01-18 DIAGNOSIS — K567 Ileus, unspecified: Secondary | ICD-10-CM | POA: Diagnosis not present

## 2022-01-18 DIAGNOSIS — D649 Anemia, unspecified: Secondary | ICD-10-CM | POA: Diagnosis present

## 2022-01-18 DIAGNOSIS — Z820 Family history of epilepsy and other diseases of the nervous system: Secondary | ICD-10-CM

## 2022-01-18 DIAGNOSIS — Z515 Encounter for palliative care: Secondary | ICD-10-CM

## 2022-01-18 DIAGNOSIS — Z9181 History of falling: Secondary | ICD-10-CM | POA: Diagnosis not present

## 2022-01-18 DIAGNOSIS — M79604 Pain in right leg: Secondary | ICD-10-CM | POA: Diagnosis not present

## 2022-01-18 DIAGNOSIS — Z87820 Personal history of traumatic brain injury: Secondary | ICD-10-CM | POA: Diagnosis not present

## 2022-01-18 DIAGNOSIS — A419 Sepsis, unspecified organism: Secondary | ICD-10-CM | POA: Diagnosis not present

## 2022-01-18 DIAGNOSIS — G9341 Metabolic encephalopathy: Secondary | ICD-10-CM | POA: Diagnosis present

## 2022-01-18 DIAGNOSIS — Z79899 Other long term (current) drug therapy: Secondary | ICD-10-CM | POA: Diagnosis not present

## 2022-01-18 DIAGNOSIS — Z20822 Contact with and (suspected) exposure to covid-19: Secondary | ICD-10-CM | POA: Diagnosis present

## 2022-01-18 DIAGNOSIS — Z66 Do not resuscitate: Secondary | ICD-10-CM | POA: Diagnosis present

## 2022-01-18 DIAGNOSIS — F05 Delirium due to known physiological condition: Secondary | ICD-10-CM | POA: Diagnosis present

## 2022-01-18 DIAGNOSIS — R54 Age-related physical debility: Secondary | ICD-10-CM | POA: Diagnosis present

## 2022-01-18 DIAGNOSIS — M40209 Unspecified kyphosis, site unspecified: Secondary | ICD-10-CM | POA: Diagnosis present

## 2022-01-18 DIAGNOSIS — J690 Pneumonitis due to inhalation of food and vomit: Secondary | ICD-10-CM | POA: Diagnosis present

## 2022-01-18 DIAGNOSIS — R7881 Bacteremia: Secondary | ICD-10-CM | POA: Diagnosis not present

## 2022-01-18 DIAGNOSIS — I10 Essential (primary) hypertension: Secondary | ICD-10-CM | POA: Diagnosis present

## 2022-01-18 DIAGNOSIS — M79605 Pain in left leg: Secondary | ICD-10-CM | POA: Diagnosis not present

## 2022-01-18 DIAGNOSIS — N390 Urinary tract infection, site not specified: Secondary | ICD-10-CM | POA: Diagnosis present

## 2022-01-18 DIAGNOSIS — R4182 Altered mental status, unspecified: Secondary | ICD-10-CM

## 2022-01-18 DIAGNOSIS — F03918 Unspecified dementia, unspecified severity, with other behavioral disturbance: Secondary | ICD-10-CM | POA: Diagnosis not present

## 2022-01-18 DIAGNOSIS — J189 Pneumonia, unspecified organism: Secondary | ICD-10-CM | POA: Diagnosis not present

## 2022-01-18 LAB — COMPREHENSIVE METABOLIC PANEL
ALT: 16 U/L (ref 0–44)
AST: 15 U/L (ref 15–41)
Albumin: 3.3 g/dL — ABNORMAL LOW (ref 3.5–5.0)
Alkaline Phosphatase: 68 U/L (ref 38–126)
Anion gap: 4 — ABNORMAL LOW (ref 5–15)
BUN: 19 mg/dL (ref 8–23)
CO2: 28 mmol/L (ref 22–32)
Calcium: 8.4 mg/dL — ABNORMAL LOW (ref 8.9–10.3)
Chloride: 107 mmol/L (ref 98–111)
Creatinine, Ser: 0.61 mg/dL (ref 0.61–1.24)
GFR, Estimated: >60 mL/min (ref >60)
Glucose, Bld: 120 mg/dL — ABNORMAL HIGH (ref 70–99)
Potassium: 3.5 mmol/L (ref 3.5–5.1)
Sodium: 139 mmol/L (ref 135–145)
Total Bilirubin: 0.9 mg/dL (ref 0.3–1.2)
Total Protein: 6.2 g/dL — ABNORMAL LOW (ref 6.5–8.1)

## 2022-01-18 LAB — BRAIN NATRIURETIC PEPTIDE: B Natriuretic Peptide: 52.4 pg/mL (ref 0.0–100.0)

## 2022-01-18 LAB — URINALYSIS, ROUTINE W REFLEX MICROSCOPIC
Bilirubin Urine: NEGATIVE
Glucose, UA: NEGATIVE mg/dL
Hgb urine dipstick: NEGATIVE
Ketones, ur: 5 mg/dL — AB
Nitrite: POSITIVE — AB
Protein, ur: NEGATIVE mg/dL
Specific Gravity, Urine: 1.021 (ref 1.005–1.030)
pH: 6 (ref 5.0–8.0)

## 2022-01-18 LAB — CBC WITH DIFFERENTIAL/PLATELET
Abs Immature Granulocytes: 0.08 10*3/uL — ABNORMAL HIGH (ref 0.00–0.07)
Basophils Absolute: 0 10*3/uL (ref 0.0–0.1)
Basophils Relative: 0 %
Eosinophils Absolute: 0 10*3/uL (ref 0.0–0.5)
Eosinophils Relative: 0 %
HCT: 36.8 % — ABNORMAL LOW (ref 39.0–52.0)
Hemoglobin: 12.3 g/dL — ABNORMAL LOW (ref 13.0–17.0)
Immature Granulocytes: 1 %
Lymphocytes Relative: 8 %
Lymphs Abs: 1.3 10*3/uL (ref 0.7–4.0)
MCH: 33.1 pg (ref 26.0–34.0)
MCHC: 33.4 g/dL (ref 30.0–36.0)
MCV: 98.9 fL (ref 80.0–100.0)
Monocytes Absolute: 0.7 10*3/uL (ref 0.1–1.0)
Monocytes Relative: 5 %
Neutro Abs: 13.4 10*3/uL — ABNORMAL HIGH (ref 1.7–7.7)
Neutrophils Relative %: 86 %
Platelets: 212 10*3/uL (ref 150–400)
RBC: 3.72 MIL/uL — ABNORMAL LOW (ref 4.22–5.81)
RDW: 12.9 % (ref 11.5–15.5)
WBC: 15.4 10*3/uL — ABNORMAL HIGH (ref 4.0–10.5)
nRBC: 0 % (ref 0.0–0.2)

## 2022-01-18 LAB — PROTIME-INR
INR: 1.1 (ref 0.8–1.2)
Prothrombin Time: 14.2 seconds (ref 11.4–15.2)

## 2022-01-18 LAB — LACTIC ACID, PLASMA: Lactic Acid, Venous: 0.7 mmol/L (ref 0.5–1.9)

## 2022-01-18 LAB — APTT: aPTT: 33 seconds (ref 24–36)

## 2022-01-18 LAB — RESP PANEL BY RT-PCR (FLU A&B, COVID) ARPGX2
Influenza A by PCR: NEGATIVE
Influenza B by PCR: NEGATIVE
SARS Coronavirus 2 by RT PCR: NEGATIVE

## 2022-01-18 LAB — TROPONIN I (HIGH SENSITIVITY): Troponin I (High Sensitivity): 4 ng/L (ref <18)

## 2022-01-18 MED ORDER — MORPHINE SULFATE (PF) 2 MG/ML IV SOLN: 1.0000 mg | INTRAVENOUS | Status: DC | PRN

## 2022-01-18 MED ORDER — ONDANSETRON HCL 4 MG PO TABS: 4.0000 mg | ORAL_TABLET | Freq: Four times a day (QID) | ORAL | Status: DC | PRN

## 2022-01-18 MED ORDER — ACETAMINOPHEN 650 MG RE SUPP
650.0000 mg | Freq: Once | RECTAL | Status: AC
  Administered 2022-01-18: 650 mg via RECTAL
  Filled 2022-01-18: qty 1

## 2022-01-18 MED ORDER — SODIUM CHLORIDE 0.9 % IV SOLN
2.0000 g | Freq: Once | INTRAVENOUS | Status: AC
  Administered 2022-01-18: 2 g via INTRAVENOUS
  Filled 2022-01-18: qty 12.5

## 2022-01-18 MED ORDER — ENOXAPARIN SODIUM 40 MG/0.4ML IJ SOSY: 40.0000 mg | PREFILLED_SYRINGE | INTRAMUSCULAR | Status: DC

## 2022-01-18 MED ORDER — SODIUM CHLORIDE 0.9 % IV SOLN
2.0000 g | Freq: Three times a day (TID) | INTRAVENOUS | Status: DC
  Administered 2022-01-19 – 2022-01-21 (×7): 2 g via INTRAVENOUS
  Filled 2022-01-18 (×8): qty 12.5

## 2022-01-18 MED ORDER — HYDRALAZINE HCL 20 MG/ML IJ SOLN: 10.0000 mg | Freq: Four times a day (QID) | INTRAMUSCULAR | Status: DC | PRN

## 2022-01-18 MED ORDER — GUAIFENESIN ER 600 MG PO TB12
600.0000 mg | ORAL_TABLET | Freq: Two times a day (BID) | ORAL | Status: DC
  Administered 2022-01-22 – 2022-01-30 (×15): 600 mg via ORAL
  Filled 2022-01-18 (×19): qty 1

## 2022-01-18 MED ORDER — ACETAMINOPHEN 325 MG PO TABS: 650.0000 mg | ORAL_TABLET | Freq: Once | ORAL | Status: DC

## 2022-01-18 MED ORDER — SODIUM CHLORIDE 0.9 % IV SOLN: INTRAVENOUS | Status: DC

## 2022-01-18 MED ORDER — ACETAMINOPHEN 650 MG RE SUPP: 650.0000 mg | Freq: Four times a day (QID) | RECTAL | Status: DC | PRN

## 2022-01-18 MED ORDER — ACETAMINOPHEN 325 MG PO TABS
650.0000 mg | ORAL_TABLET | Freq: Four times a day (QID) | ORAL | Status: DC | PRN
  Filled 2022-01-18: qty 2

## 2022-01-18 MED ORDER — VANCOMYCIN HCL 1500 MG/300ML IV SOLN
1500.0000 mg | Freq: Once | INTRAVENOUS | Status: AC
  Administered 2022-01-18: 1500 mg via INTRAVENOUS
  Filled 2022-01-18: qty 300

## 2022-01-18 MED ORDER — VANCOMYCIN HCL IN DEXTROSE 1-5 GM/200ML-% IV SOLN: 1000.0000 mg | Freq: Once | INTRAVENOUS | Status: DC

## 2022-01-18 MED ORDER — ONDANSETRON HCL 4 MG/2ML IJ SOLN: 4.0000 mg | Freq: Four times a day (QID) | INTRAMUSCULAR | Status: DC | PRN

## 2022-01-18 MED ORDER — LACTATED RINGERS IV SOLN: INTRAVENOUS | Status: DC

## 2022-01-18 MED ORDER — IOHEXOL 300 MG/ML  SOLN
100.0000 mL | Freq: Once | INTRAMUSCULAR | Status: AC | PRN
  Administered 2022-01-18: 100 mL via INTRAVENOUS

## 2022-01-18 MED ORDER — METRONIDAZOLE 500 MG/100ML IV SOLN
500.0000 mg | Freq: Once | INTRAVENOUS | Status: AC
  Administered 2022-01-18: 500 mg via INTRAVENOUS
  Filled 2022-01-18: qty 100

## 2022-01-18 MED ORDER — IPRATROPIUM BROMIDE 0.02 % IN SOLN: 0.5000 mg | Freq: Four times a day (QID) | RESPIRATORY_TRACT | Status: DC

## 2022-01-18 MED ORDER — HYDROCODONE-ACETAMINOPHEN 5-325 MG PO TABS
1.0000 | ORAL_TABLET | ORAL | Status: DC | PRN
  Administered 2022-01-26: 2 via ORAL
  Filled 2022-01-18: qty 2

## 2022-01-18 MED ORDER — LACTATED RINGERS IV BOLUS (SEPSIS)
1000.0000 mL | Freq: Once | INTRAVENOUS | Status: AC
  Administered 2022-01-18: 1000 mL via INTRAVENOUS

## 2022-01-18 MED ORDER — ALBUTEROL SULFATE (2.5 MG/3ML) 0.083% IN NEBU: 2.5000 mg | INHALATION_SOLUTION | Freq: Four times a day (QID) | RESPIRATORY_TRACT | Status: DC

## 2022-01-18 NOTE — H&P (Signed)
History and Physical  ? ?[facility] [place] [facility] ?Admission History and Physical ?[employer], D.O.  ? ? ?Patient Name: Micheal [name] ?MR#: [id number] ?Date of Birth: [birth year] ?Date of Admission: 01/18/2022 ? ?Referring MD/NP/PA: Dr. Wallace Cullens ?Primary Care Physician: Kirby Funk, MD ? ?Chief Complaint:  ?Chief Complaint  ?Patient presents with  ? Altered Mental Status  ? Fever  ?  Pt arrived via EMS from [facility] facility. Staff reported pt has been altered from baseline, pt usually ambulatory.  ?Please note the entire history is obtained from the patient's emergency department chart, emergency department provider and the patient's family who is at the bedside. Patient's personal history is limited by dementia and altered mental status. ? ? ?HPI: Micheal [name] is a 66 y.o. male with a known history of HTN, MDD, nonverbal at baseline but usually ambulates presents to the emergency department for evaluation of fever and change in mental status..  Patient was sent by his care facility [facility]) for increased lethargy / change in mental status. ? ? ?EMS/ED Course: Patient received Maxipime, Flagyl, vancomycin. Medical admission has been requested for further management of aspiration pneumonia, possible SBO. ? ?Review of Systems:  ?Unable to obtain 2/2 AMS.  ? ? ?Past Medical History:  ?Diagnosis Date  ? Adjustment insomnia   ? Anxiety disorder due to known physiological condition   ? Hypertension   ? Major depressive disorder, recurrent, unspecified (HCC)   ? 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 Other frontotemporal neurocognitive disorder (HCC)   ? ? ?No past surgical history on file. ? ? reports that he has never smoked. He has never used smokeless tobacco. He reports that he does not drink alcohol and does not use drugs. ? ?No Known Allergies ? ?Family History  ?Problem Relation Age of Onset  ? Dementia Father   ? Parkinsonism Father   ? Dementia Paternal  Aunt   ? Dementia Paternal Uncle   ? Dementia Paternal Grandfather   ? ? ?Prior to Admission medications   ?Medication Sig Start Date End Date Taking? Authorizing Provider  ?acetaminophen (TYLENOL) 325 MG tablet Take 2 tablets (650 mg total) by mouth every 4 (four) hours as needed for mild pain (or temp > 37.5 C (99.5 F)). 08/14/21  Yes Lonia Blood, MD  ?acetaminophen (TYLENOL) 500 MG tablet Take 500 mg by mouth every 6 (six) hours as needed for headache, fever or mild pain.   Yes [provider]  ?alum & mag hydroxide-simeth (MAALOX/MYLANTA) 200-200-20 MG/5ML suspension Take 30 mLs by mouth every 6 (six) hours as needed for indigestion or heartburn.   Yes [provider]  ?benztropine (COGENTIN) 1 MG tablet Take 1 tablet (1 mg total) by mouth 2 (two) times daily. 08/14/21  Yes Lonia Blood, MD  ?guaifenesin (ROBITUSSIN) 100 MG/5ML syrup Take 200 mg by mouth every 6 (six) hours as needed for cough.   Yes [provider]  ?haloperidol decanoate (HALDOL DECANOATE) 50 MG/ML injection Inject 50 mg into the muscle every 28 (twenty-eight) days.   Yes [provider]  ?loperamide (IMODIUM) 2 MG capsule Take 2 mg by mouth as needed for diarrhea or loose stools (max 8 doses/ 24 hours).   Yes [provider]  ?magnesium hydroxide (MILK OF MAGNESIA) 400 MG/5ML suspension Take 30 mLs by mouth at bedtime as needed (constipation).   Yes [provider]  ?Neomycin-Bacitracin-Polymyxin (TRIPLE ANTIBIOTIC) [phone] OINT Apply 1 application topically daily as needed (  minor skin tears/abrasians).   Yes [provider]  ?NON FORMULARY Take 1 Can by mouth 3 (three) times daily. Mighty Shakes   Yes [provider]  ?nystatin (MYCOSTATIN/NYSTOP) powder Apply 1 application topically 2 (two) times daily.   Yes [provider]  ?polyethylene glycol (MIRALAX / GLYCOLAX) 17 g packet Take 17 g by mouth daily as needed for mild constipation. ?Patient  taking differently: Take 17 g by mouth daily as needed (constipation). 03/07/20  Yes Zannie CoveJoseph, Preetha, MD  ?clopidogrel (PLAVIX) 75 MG tablet Take 75 mg by mouth daily. ?Patient not taking: Reported on 01/18/2022    [provider]  ? ? ?Physical Exam: ?Vitals:  ? 01/18/22 2210 01/18/22 2211 01/18/22 2230 01/18/22 2300  ?BP: 116/75  126/74 122/74  ?Pulse: 92  94 86  ?Resp: 14  18 11   ?Temp:  99.2 ?F (37.3 ?C)    ?TempSrc:  Axillary    ?SpO2: 91%  93% 93%  ? ? ?GENERAL: 66 y.o.-year-old chronically ill-appearing white male patient, well-developed, well-nourished lying in the bed in no acute distress.  ?HEENT: Head atraumatic, normocephalic. Pupils equal. Mucus membranes dry. ?NECK: Supple. No JVD. ?CHEST: Diminished breath sounds bilaterally. No wheezing, rales, rhonchi or crackles. No use of accessory muscles of respiration.  No reproducible chest wall tenderness.  ?CARDIOVASCULAR: S1, S2 normal. No murmurs, rubs, or gallops. Cap refill <2 seconds. Pulses intact distally.  ?ABDOMEN: Soft, nondistended, nontender. No rebound, guarding, rigidity. Normoactive bowel sounds present in all four quadrants.  ?EXTREMITIES: No pedal edema, cyanosis, or clubbing. No calf tenderness or Homan's sign.  ?NEUROLOGIC: The patient is lethargic but arousable. ?  ?Labs on Admission: ? ?CBC: ?Recent Labs  ?Lab 01/18/22 ?2000  ?WBC 15.4*  ?NEUTROABS 13.4*  ?HGB 12.3*  ?HCT 36.8*  ?MCV 98.9  ?PLT 212  ? ?Basic Metabolic Panel: ?Recent Labs  ?Lab 01/18/22 ?2000  ?NA 139  ?K 3.5  ?CL 107  ?CO2 28  ?GLUCOSE 120*  ?BUN 19  ?CREATININE 0.61  ?CALCIUM 8.4*  ? ?GFR: ?Estimated Creatinine Clearance: 95.1 mL/min (by C-G formula based on SCr of 0.61 mg/dL). ?Liver Function Tests: ?Recent Labs  ?Lab 01/18/22 ?2000  ?AST 15  ?ALT 16  ?ALKPHOS 68  ?BILITOT 0.9  ?PROT 6.2*  ?ALBUMIN 3.3*  ? ?No results for input(s): LIPASE, AMYLASE in the last 168 hours. ?No results for input(s): AMMONIA in the last 168 hours. ?Coagulation Profile: ?Recent Labs   ?Lab 01/18/22 ?2000  ?INR 1.1  ? ?Cardiac Enzymes: ?No results for input(s): CKTOTAL, CKMB, CKMBINDEX, TROPONINI in the last 168 hours. ?BNP (last 3 results) ?No results for input(s): PROBNP in the last 8760 hours. ?HbA1C: ?No results for input(s): HGBA1C in the last 72 hours. ?CBG: ?No results for input(s): GLUCAP in the last 168 hours. ?Lipid Profile: ?No results for input(s): CHOL, HDL, LDLCALC, TRIG, CHOLHDL, LDLDIRECT in the last 72 hours. ?Thyroid Function Tests: ?No results for input(s): TSH, T4TOTAL, FREET4, T3FREE, THYROIDAB in the last 72 hours. ?Anemia Panel: ?No results for input(s): VITAMINB12, FOLATE, FERRITIN, TIBC, IRON, RETICCTPCT in the last 72 hours. ?Urine analysis: ?   ?Component Value Date/Time  ? COLORURINE YELLOW 01/18/2022 1951  ? APPEARANCEUR CLEAR 01/18/2022 1951  ? LABSPEC 1.021 01/18/2022 1951  ? PHURINE 6.0 01/18/2022 1951  ? GLUCOSEU NEGATIVE 01/18/2022 1951  ? HGBUR NEGATIVE 01/18/2022 1951  ? BILIRUBINUR NEGATIVE 01/18/2022 1951  ? KETONESUR 5 (A) 01/18/2022 1951  ? PROTEINUR NEGATIVE 01/18/2022 1951  ? NITRITE POSITIVE (A) 01/18/2022 1951  ?  LEUKOCYTESUR TRACE (A) 01/18/2022 1951  ? ?Sepsis Labs: ?@LABRCNTIP (procalcitonin:4,lacticidven:4) ?) ?Recent Results (from the past 240 hour(s))  ?Resp Panel by RT-PCR (Flu A&B, Covid) Nasopharyngeal Swab     Status: None  ? Collection Time: 01/18/22  8:15 PM  ? Specimen: Nasopharyngeal Swab; Nasopharyngeal(NP) swabs in vial transport medium  ?Result Value Ref Range Status  ? SARS Coronavirus 2 by RT PCR NEGATIVE NEGATIVE Final  ?  Comment: (NOTE) ?SARS-CoV-2 target nucleic acids are NOT DETECTED. ? ?The SARS-CoV-2 RNA is generally detectable in upper respiratory ?specimens during the acute phase of infection. The lowest ?concentration of SARS-CoV-2 viral copies this assay can detect is ?138 copies/mL. A negative result does not preclude SARS-Cov-2 ?infection and should not be used as the sole basis for treatment or ?other patient management  decisions. A negative result may occur with  ?improper specimen collection/handling, submission of specimen other ?than nasopharyngeal swab, presence of viral mutation(s) within the ?areas targeted by t

## 2022-01-18 NOTE — Progress Notes (Signed)
A consult was received from an ED physician for Cefepime and Vancomycin per pharmacy dosing.  The patient's profile has been reviewed for ht/wt/allergies/indication/available labs.   ?A one time order has been placed for Cefepime 2g, Vancomycin 1500mg .  Further antibiotics/pharmacy consults should be ordered by admitting physician if indicated.       ?                ?Thank you, ? ? PharmD, BCPS ?Clinical Pharmacist ?[facility] main pharmacy [phone] ?01/18/2022 7:41 PM ? ?

## 2022-01-18 NOTE — Sepsis Progress Note (Addendum)
Elink following Code Sepsis  ? ?In Results review it's showing Blood culture collection time of 2000 & 2007. ? ?Per Bedside RN "First blood culture was collected at 1920 & Second set was drawn prior to first antibiotic."  ? ?Per Bedside RN blood cultures were collected before administration of antibiotics.  ? ?

## 2022-01-18 NOTE — ED Notes (Signed)
Patient taken to CT at this time.

## 2022-01-19 ENCOUNTER — Inpatient Hospital Stay (HOSPITAL_COMMUNITY): Payer: Medicare Other

## 2022-01-19 DIAGNOSIS — A419 Sepsis, unspecified organism: Secondary | ICD-10-CM

## 2022-01-19 DIAGNOSIS — J189 Pneumonia, unspecified organism: Secondary | ICD-10-CM

## 2022-01-19 DIAGNOSIS — M79604 Pain in right leg: Secondary | ICD-10-CM | POA: Diagnosis not present

## 2022-01-19 DIAGNOSIS — M79605 Pain in left leg: Secondary | ICD-10-CM | POA: Diagnosis not present

## 2022-01-19 LAB — CREATININE, SERUM
Creatinine, Ser: 0.62 mg/dL (ref 0.61–1.24)
GFR, Estimated: >60 mL/min (ref >60)

## 2022-01-19 LAB — TROPONIN I (HIGH SENSITIVITY): Troponin I (High Sensitivity): 6 ng/L (ref <18)

## 2022-01-19 LAB — PROTIME-INR
INR: 1.2 (ref 0.8–1.2)
Prothrombin Time: 15.4 seconds — ABNORMAL HIGH (ref 11.4–15.2)

## 2022-01-19 LAB — GLUCOSE, CAPILLARY
Glucose-Capillary: 82 mg/dL (ref 70–99)
Glucose-Capillary: 90 mg/dL (ref 70–99)

## 2022-01-19 LAB — CBC
HCT: 31.5 % — ABNORMAL LOW (ref 39.0–52.0)
Hemoglobin: 10.8 g/dL — ABNORMAL LOW (ref 13.0–17.0)
MCH: 34 pg (ref 26.0–34.0)
MCHC: 34.3 g/dL (ref 30.0–36.0)
MCV: 99.1 fL (ref 80.0–100.0)
Platelets: 172 10*3/uL (ref 150–400)
RBC: 3.18 MIL/uL — ABNORMAL LOW (ref 4.22–5.81)
RDW: 12.8 % (ref 11.5–15.5)
WBC: 12 10*3/uL — ABNORMAL HIGH (ref 4.0–10.5)
nRBC: 0 % (ref 0.0–0.2)

## 2022-01-19 LAB — APTT: aPTT: 36 seconds (ref 24–36)

## 2022-01-19 LAB — LACTIC ACID, PLASMA
Lactic Acid, Venous: 0.9 mmol/L (ref 0.5–1.9)
Lactic Acid, Venous: 1 mmol/L (ref 0.5–1.9)

## 2022-01-19 LAB — MRSA NEXT GEN BY PCR, NASAL: MRSA by PCR Next Gen: DETECTED — AB

## 2022-01-19 MED ORDER — DEXTROSE-NACL 5-0.45 % IV SOLN: INTRAVENOUS | Status: AC

## 2022-01-19 MED ORDER — DM-GUAIFENESIN ER 30-600 MG PO TB12: 1.0000 | ORAL_TABLET | Freq: Two times a day (BID) | ORAL | Status: DC | PRN

## 2022-01-19 MED ORDER — SENNOSIDES-DOCUSATE SODIUM 8.6-50 MG PO TABS: 1.0000 | ORAL_TABLET | Freq: Every evening | ORAL | Status: DC | PRN

## 2022-01-19 MED ORDER — IPRATROPIUM-ALBUTEROL 0.5-2.5 (3) MG/3ML IN SOLN: 3.0000 mL | RESPIRATORY_TRACT | Status: DC | PRN

## 2022-01-19 MED ORDER — IPRATROPIUM-ALBUTEROL 0.5-2.5 (3) MG/3ML IN SOLN
3.0000 mL | Freq: Four times a day (QID) | RESPIRATORY_TRACT | Status: DC
  Administered 2022-01-19 (×2): 3 mL via RESPIRATORY_TRACT
  Filled 2022-01-19 (×3): qty 3

## 2022-01-19 MED ORDER — HYDRALAZINE HCL 20 MG/ML IJ SOLN: 10.0000 mg | INTRAMUSCULAR | Status: DC | PRN

## 2022-01-19 MED ORDER — METRONIDAZOLE 500 MG/100ML IV SOLN
500.0000 mg | Freq: Two times a day (BID) | INTRAVENOUS | Status: DC
  Administered 2022-01-19 – 2022-01-21 (×5): 500 mg via INTRAVENOUS
  Filled 2022-01-19 (×5): qty 100

## 2022-01-19 MED ORDER — VANCOMYCIN HCL IN DEXTROSE 1-5 GM/200ML-% IV SOLN
1000.0000 mg | Freq: Two times a day (BID) | INTRAVENOUS | Status: AC
  Administered 2022-01-19 – 2022-01-25 (×14): 1000 mg via INTRAVENOUS
  Filled 2022-01-19 (×14): qty 200

## 2022-01-19 MED ORDER — TRAZODONE HCL 50 MG PO TABS
50.0000 mg | ORAL_TABLET | Freq: Every evening | ORAL | Status: DC | PRN
  Administered 2022-01-21: 50 mg via ORAL
  Filled 2022-01-19 (×2): qty 1

## 2022-01-19 MED ORDER — BENZTROPINE MESYLATE 0.5 MG PO TABS
1.0000 mg | ORAL_TABLET | Freq: Two times a day (BID) | ORAL | Status: DC
  Administered 2022-01-21 – 2022-01-30 (×18): 1 mg via ORAL
  Filled 2022-01-19 (×21): qty 2

## 2022-01-19 NOTE — Progress Notes (Signed)
?PROGRESS NOTE ? ? ? ?Micheal [name]  FFM:[id number] DOB: 1956/02/29 DOA: 01/18/2022 ?PCP: Kirby Funk, MD  ? ?Brief Narrative:  ?66 year old with history of HTN, major depressive disorder, nonverbal at baseline but usually ambulates brought to the hospital from [facility] facility for change in mental status and fever.  Initially there was concerns of aspiration pneumonia therefore started on IV vancomycin, Maxipime and Flagyl.  CT also showed possible early small bowel obstruction and case was discussed by ED physician with Dr. Cliffton Asters from general surgery who did not think patient has SBO. ? ? ?Assessment & Plan: ? Principal Problem: ?  Sepsis due to pneumonia [facility]) ?  ?Sepsis secondary to aspiration pneumonia, right lower lobe ?Asymptomatic bacteriuria ?- Sepsis evidenced by leukocytosis, fever and the source ?- Cultures drawn.  Started on broad-spectrum antibiotics-Maxipime, vancomycin and Flagyl ?- Bronchodilators, supplemental oxygen ?- Urine Legionella, strep antigen ?-Currently n.p.o., speech and swallow evaluation ?- BNP 52.4.  COVID/flu-negative ? ?Acute metabolic encephalopathy ?- Likely from underlying infection.  CT head shows acute on chronic subdural hematoma, measuring 1.5 cm, stable 4 mm rightward midline shift. Discussed with NeuroSx Leo Grosser PA and her MD), ok with continuing full dose Anticoag if needed  ? ?Acute on chronic subdural hematoma ?-This was diagnosed on 01/07/2022 where patient presented to the ED with fall.  At that time neurosurgery recommended discharging patient back to the facility as there was no acute intervention necessary. ?Discontinue subcu Lovenox, keep him on SCDs ? ?History of acute PE with right popliteal DVT, November 2022 ?- Was supposed to be on anticoagulation, currently only on Plavix?  We will need to reinvestigate this.  If he really is still on anticoagulation, will need to rediscuss options especially due to recurrent falls and subdural hematoma.  Apparently was stopped back in March 2023 due to falls.  ?LE dopplers 01/19/22 = Neg.  ? ?Major depressive disorder ?- Benztropine ? ?Essential hypertension ?- Not on any outpatient medication. ? ?Frontotemporal lobe dementia; Advance dementia ?- Expect intermittent agitation and delirium, previously placed on Cogentin 1 mg twice daily. ? ?I have consulted pharmacy to go over his preadmission medications including anticoagulation. ? ?I have expressed he will be a good candidate for Hospice, sister will consider this. Will contact hospice team to reach out to the sister.  ? ?DVT prophylaxis: SCDs, DC Lovenox ?Code Status:  DNR- per records ?Family Communication:  Debbie updated. Now her guardian ? ?Status is: Inpatient ?Off and on agitation. Currently on Abx for Aspiration PNA.  ? ? ?Subjective: ?Laying in the bed eyes closed. No conversation.  ? ?Examination: ? ?General exam: Appears comfortable resting  ?Respiratory system: Clear to auscultation. Respiratory effort normal. ?Cardiovascular system: S1 & S2 heard, RRR. No JVD, murmurs, rubs, gallops or clicks. No pedal edema. ?Gastrointestinal system: Abdomen is nondistended, soft and nontender. No organomegaly or masses felt. Normal bowel sounds heard. ?Central nervous system: Unable to assess ?Extremities: Grossly moving all ext.  ?Skin: No rashes, lesions or ulcers ?Psychiatry: Poor judgement ? ? ? ?Objective: ?Vitals:  ? 01/19/22 0249 01/19/22 0314 01/19/22 0535 01/19/22 0625  ?BP: 126/77  137/83   ?Pulse: 85  81   ?Resp: 18  18   ?Temp: 98.3 ?F (36.8 ?C)  98.3 ?F (36.8 ?C)   ?TempSrc: Oral  Oral   ?SpO2: 97% 97% 97% 96%  ? ? ?Intake/Output Summary (Last 24 hours) at 01/19/2022 0829 ?Last data filed at 01/19/2022 0820 ?Gross per 24 hour  ?Intake 2913.1 ml  ?Output 400  ml  ?Net 2513.1 ml  ? ?There were no vitals filed for this visit. ? ? ?Data Reviewed:  ? ?CBC: ?Recent Labs  ?Lab 01/18/22 ?2000 01/19/22 ?0007  ?WBC 15.4* 12.0*  ?NEUTROABS 13.4*  --   ?HGB 12.3* 10.8*   ?HCT 36.8* 31.5*  ?MCV 98.9 99.1  ?PLT 212 172  ? ?Basic Metabolic Panel: ?Recent Labs  ?Lab 01/18/22 ?2000 01/19/22 ?0007  ?NA 139  --   ?K 3.5  --   ?CL 107  --   ?CO2 28  --   ?GLUCOSE 120*  --   ?BUN 19  --   ?CREATININE 0.61 0.62  ?CALCIUM 8.4*  --   ? ?GFR: ?Estimated Creatinine Clearance: 95.1 mL/min (by C-G formula based on SCr of 0.62 mg/dL). ?Liver Function Tests: ?Recent Labs  ?Lab 01/18/22 ?2000  ?AST 15  ?ALT 16  ?ALKPHOS 68  ?BILITOT 0.9  ?PROT 6.2*  ?ALBUMIN 3.3*  ? ?No results for input(s): LIPASE, AMYLASE in the last 168 hours. ?No results for input(s): AMMONIA in the last 168 hours. ?Coagulation Profile: ?Recent Labs  ?Lab 01/18/22 ?2000 01/19/22 ?0007  ?INR 1.1 1.2  ? ?Cardiac Enzymes: ?No results for input(s): CKTOTAL, CKMB, CKMBINDEX, TROPONINI in the last 168 hours. ?BNP (last 3 results) ?No results for input(s): PROBNP in the last 8760 hours. ?HbA1C: ?No results for input(s): HGBA1C in the last 72 hours. ?CBG: ?No results for input(s): GLUCAP in the last 168 hours. ?Lipid Profile: ?No results for input(s): CHOL, HDL, LDLCALC, TRIG, CHOLHDL, LDLDIRECT in the last 72 hours. ?Thyroid Function Tests: ?No results for input(s): TSH, T4TOTAL, FREET4, T3FREE, THYROIDAB in the last 72 hours. ?Anemia Panel: ?No results for input(s): VITAMINB12, FOLATE, FERRITIN, TIBC, IRON, RETICCTPCT in the last 72 hours. ?Sepsis Labs: ?Recent Labs  ?Lab 01/18/22 ?2000 01/19/22 ?0108 01/19/22 ?4818  ?LATICACIDVEN 0.7 0.9 1.0  ? ? ?Recent Results (from the past 240 hour(s))  ?Resp Panel by RT-PCR (Flu A&B, Covid) Nasopharyngeal Swab     Status: None  ? Collection Time: 01/18/22  8:15 PM  ? Specimen: Nasopharyngeal Swab; Nasopharyngeal(NP) swabs in vial transport medium  ?Result Value Ref Range Status  ? SARS Coronavirus 2 by RT PCR NEGATIVE NEGATIVE Final  ?  Comment: (NOTE) ?SARS-CoV-2 target nucleic acids are NOT DETECTED. ? ?The SARS-CoV-2 RNA is generally detectable in upper respiratory ?specimens during the acute  phase of infection. The lowest ?concentration of SARS-CoV-2 viral copies this assay can detect is ?138 copies/mL. A negative result does not preclude SARS-Cov-2 ?infection and should not be used as the sole basis for treatment or ?other patient management decisions. A negative result may occur with  ?improper specimen collection/handling, submission of specimen other ?than nasopharyngeal swab, presence of viral mutation(s) within the ?areas targeted by this assay, and inadequate number of viral ?copies(<138 copies/mL). A negative result must be combined with ?clinical observations, patient history, and epidemiological ?information. The expected result is Negative. ? ?Fact Sheet for Patients:  ?BloggerCourse.com ? ?Fact Sheet for Healthcare Providers:  ?SeriousBroker.it ? ?This test is no t yet approved or cleared by the [place] FDA and  ?has been authorized for detection and/or diagnosis of SARS-CoV-2 by ?FDA under an Emergency Use Authorization (EUA). This EUA will remain  ?in effect (meaning this test can be used) for the duration of the ?COVID-19 declaration under Section 564(b)(1) of the Act, 21 ?U.S.C.section 360bbb-3(b)(1), unless the authorization is terminated  ?or revoked sooner.  ? ? ?  ? Influenza A by PCR NEGATIVE  NEGATIVE Final  ? Influenza B by PCR NEGATIVE NEGATIVE Final  ?  Comment: (NOTE) ?The Xpert Xpress SARS-CoV-2/FLU/RSV plus assay is intended as an aid ?in the diagnosis of influenza from Nasopharyngeal swab specimens and ?should not be used as a sole basis for treatment. Nasal washings and ?aspirates are unacceptable for Xpert Xpress SARS-CoV-2/FLU/RSV ?testing. ? ?Fact Sheet for Patients: ?BloggerCourse.comhttps://www.fda.gov/media/152166/download ? ?Fact Sheet for Healthcare Providers: ?SeriousBroker.ithttps://www.fda.gov/media/152162/download ? ?This test is not yet approved or cleared by the [place] FDA and ?has been authorized for detection and/or diagnosis of  SARS-CoV-2 by ?FDA under an Emergency Use Authorization (EUA). This EUA will remain ?in effect (meaning this test can be used) for the duration of the ?COVID-19 declaration under Section 564(b)(1) of the Act,

## 2022-01-19 NOTE — Progress Notes (Signed)
Pharmacy Antibiotic Note ? ?Micheal [name] is a 66 y.o. male admitted on 01/18/2022 with sepsis.  Pharmacy has been consulted for Vancomycin dosing. ? ?Plan: ?Cefepime 2gm IV q8h ?Vancomycin 1gm IV q12 to target AUC 400-550 ?Check MRSa PCR and consider d/c Vanc if negative ?Check Vancomycin levels at steady state ?Monitor renal function and cx data  ? ?  ? ?Temp (24hrs), Avg:100.3 ?F (37.9 ?C), Min:99.2 ?F (37.3 ?C), Max:101.3 ?F (38.5 ?C) ? ?Recent Labs  ?Lab 01/18/22 ?2000  ?WBC 15.4*  ?CREATININE 0.61  ?LATICACIDVEN 0.7  ?  ?Estimated Creatinine Clearance: 95.1 mL/min (by C-G formula based on SCr of 0.61 mg/dL).   ? ?No Known Allergies ? ?Antimicrobials this admission: ?4/14 Cefepime >>  ?4/14 Vancomycin >>  ?4/14 Metronidazole >> ? ?Dose adjustments this admission: ? ?Microbiology results: ?4/14 BCx:  ?4/14 UCx:   ? ?Thank you for allowing pharmacy to be a part of this patient?s care. ? ?Netta Cedars PharmD ?01/19/2022 12:10 AM ? ?

## 2022-01-19 NOTE — Progress Notes (Signed)
Bilateral lower extremity venous duplex has been completed. ?Preliminary results can be found in CV Proc through chart review.  ? ?01/19/22 10:20 AM ?Carlos Levering RVT   ?

## 2022-01-19 NOTE — TOC Progression Note (Signed)
Transition of Care (TOC) - Progression Note  ? ? ?Patient Details  ?Name: Micheal [name] ?MRN: [id number] ?Date of Birth: [birth year] ? ?Transition of Care (TOC) CM/SW Contact  ?Cecille Po, RN ?Phone Number: ?01/19/2022, 4:00 PM ? ?Clinical Narrative:    ? ?Received a consult to send a referral to hospice.  Called patient's sister and guardian, Micheal [name].  Debbie lives in [place], [place].  Patient lives in [place].  Micheal [name] states she did try to find a facility for the patient in her area, but no one would accept him once they saw Haldol on his med list.   ?Micheal [name] is agreeable to a hospice consult.  Briefly discussed the difference between hospice and palliative care and she's interested in hearing more.  Agreed to a referral to [facility].  Micheal [name] states she will be in town on Tuesday morning.   ? ?CM contacted [facility] rep Misty and gave referral information.  She states someone will be in contact with patient's sister in the next few days.   ? ? ?

## 2022-01-19 NOTE — ED Provider Notes (Addendum)
?[facility] DEPT ?Provider Note ? ? ?CSN: [id number] ?Arrival date & time: 01/18/22  1851 ? ?  ? ?History ? ?Chief Complaint  ?Patient presents with  ? Altered Mental Status  ? Fever  ?  Pt arrived via EMS from [facility] facility. Staff reported pt has been altered from baseline, pt usually ambulatory.  ? ? ?Micheal [name] is a 66 y.o. male. ? ? Patient as above with significant medical history as below, including anxiety, MDD, depression, cognitive disorder, nursing home pt who presents to the ED with complaint of fever, delirium ? ?Patient unable to contribute to history.  EMS/nursing home reports is similar to his baseline of more sleepy than normal and limited ambulation.  Patient has minimal verbal response at baseline, occasionally respond to questions appropriately.  Does make occasional eye contact. ? ?Patient from [facility]. ? ? ? ? 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 ?No past surgical history on file.  ? ? ?The history is provided by the patient and the EMS personnel. The history is limited by the condition of the patient. No language interpreter was used.  ?Altered Mental Status ?Associated symptoms: fever   ?Fever ? ?  ? ?Home Medications ?Prior to Admission medications   ?Medication Sig Start Date End Date Taking? Authorizing Provider  ?acetaminophen (TYLENOL) 325 MG tablet Take 2 tablets (650 mg total) by mouth every 4 (four) hours as needed for mild pain (or temp > 37.5 C (99.5 F)). 08/14/21  Yes Cherene Altes, MD  ?acetaminophen (TYLENOL) 500 MG tablet Take 500 mg by mouth every 6 (six) hours as needed for headache, fever or mild pain.   Yes [provider]   ?alum & mag hydroxide-simeth (MAALOX/MYLANTA) 200-200-20 MG/5ML suspension Take 30 mLs by mouth every 6 (six) hours as needed for indigestion or heartburn.   Yes [provider]  ?benztropine (COGENTIN) 1 MG tablet Take 1 tablet (1 mg total) by mouth 2 (two) times daily. 08/14/21  Yes Cherene Altes, MD  ?guaifenesin (ROBITUSSIN) 100 MG/5ML syrup Take 200 mg by mouth every 6 (six) hours as needed for cough.   Yes [provider]  ?haloperidol decanoate (HALDOL DECANOATE) 50 MG/ML injection Inject 50 mg into the muscle every 28 (twenty-eight) days.   Yes [provider]  ?loperamide (IMODIUM) 2 MG capsule Take 2 mg by mouth as needed for diarrhea or loose stools (max 8 doses/ 24 hours).   Yes [provider]  ?magnesium hydroxide (MILK OF MAGNESIA) 400 MG/5ML suspension Take 30 mLs by mouth at bedtime as needed (constipation).   Yes [provider]  ?Neomycin-Bacitracin-Polymyxin (TRIPLE ANTIBIOTIC) [phone] OINT Apply 1 application topically daily as needed (minor skin tears/abrasians).   Yes [provider]  ?NON FORMULARY Take 1 Can by mouth 3 (three) times daily. Mighty Shakes   Yes [provider]  ?nystatin (MYCOSTATIN/NYSTOP) powder Apply 1 application topically 2 (two) times daily.   Yes [provider]  ?polyethylene glycol (MIRALAX / GLYCOLAX) 17 g packet Take 17 g by mouth daily as needed for mild constipation. ?Patient taking differently: Take 17 g by mouth daily as needed (constipation). 03/07/20  Yes Domenic Polite, MD  ?clopidogrel (PLAVIX) 75 MG tablet Take 75 mg by mouth daily. ?Patient not taking:  Reported on 01/18/2022    [provider]  ?   ? ?Allergies    ?Patient has no known allergies.   ? ?Review of Systems   ?Review of Systems  ?Unable to perform ROS: Patient nonverbal  ?Constitutional:  Positive for fever.  ? ?Physical Exam ?Updated Vital Signs ?BP 124/71   Pulse 82   Temp 99.2 ?F (37.3 ?C) (Axillary)    Resp 16   SpO2 94%  ?Physical Exam ?Vitals and nursing note reviewed.  ?Constitutional:   ?   General: He is not in acute distress. ?   Appearance: He is well-developed. He is ill-appearing and diaphoretic.  ?   Comments: Frail-appearing ?Warm to the touch  ?HENT:  ?   Head: Normocephalic and atraumatic.  ?   Right Ear: External ear normal.  ?   Left Ear: External ear normal.  ?   Mouth/Throat:  ?   Mouth: Mucous membranes are dry.  ?Eyes:  ?   General: No scleral icterus.    ?   Right eye: No discharge.     ?   Left eye: No discharge.  ?Neck:  ?   Comments: Kyphosis ?Cardiovascular:  ?   Rate and Rhythm: Regular rhythm. Tachycardia present.  ?   Pulses: Normal pulses.     ?     Radial pulses are 2+ on the right side and 2+ on the left side.  ?     Dorsalis pedis pulses are 2+ on the right side and 2+ on the left side.  ?   Heart sounds: Normal heart sounds.  ?Pulmonary:  ?   Effort: Pulmonary effort is normal. No respiratory distress.  ?   Breath sounds: Decreased air movement present. Decreased breath sounds present.  ?   Comments: Adventitious breath sounds bilateral ?Abdominal:  ?   General: Abdomen is flat. There is no distension.  ?   Palpations: Abdomen is soft.  ?   Tenderness: There is no abdominal tenderness. There is no guarding.  ?Musculoskeletal:     ?   General: Normal range of motion.  ?   Cervical back: Normal range of motion.  ?   Right lower leg: No edema.  ?   Left lower leg: No edema.  ?Skin: ?   General: Skin is warm.  ?   Capillary Refill: Capillary refill takes 2 to 3 seconds.  ?   Comments: Minimal erythema to right foot.  No demarcation.  Palpable pulses bilateral feet, DP ?  ?Neurological:  ?   Mental Status: He is alert.  ?   Cranial Nerves: No facial asymmetry.  ?   Motor: No seizure activity.  ?   Comments: Minimal verbal response, will answer some questions with "yes" or "no" intermittently  ?Psychiatric:     ?   Mood and Affect: Mood normal.     ?   Behavior: Behavior normal.   ? ? ?ED Results / Procedures / Treatments   ?Labs ?(all labs ordered are listed, but only abnormal results are displayed) ?Labs Reviewed  ?COMPREHENSIVE METABOLIC PANEL - Abnormal; Notable for the following components:  ?    Result Value  ? Glucose, Bld 120 (*)   ? Calcium 8.4 (*)   ? Total Protein 6.2 (*)   ? Albumin 3.3 (*)   ? Anion gap 4 (*)   ? All other components within normal limits  ?CBC WITH DIFFERENTIAL/PLATELET - Abnormal; Notable for the following components:  ? WBC 15.4 (*)   ?  RBC 3.72 (*)   ? Hemoglobin 12.3 (*)   ? HCT 36.8 (*)   ? Neutro Abs 13.4 (*)   ? Abs Immature Granulocytes 0.08 (*)   ? All other components within normal limits  ?URINALYSIS, ROUTINE W REFLEX MICROSCOPIC - Abnormal; Notable for the following components:  ? Ketones, ur 5 (*)   ? Nitrite POSITIVE (*)   ? Leukocytes,Ua TRACE (*)   ? Bacteria, UA RARE (*)   ? All other components within normal limits  ?RESP PANEL BY RT-PCR (FLU A&B, COVID) [id number]  ?CULTURE, BLOOD (ROUTINE X 2)  ?CULTURE, BLOOD (ROUTINE X 2)  ?URINE CULTURE  ?EXPECTORATED SPUTUM ASSESSMENT W GRAM STAIN, RFLX TO RESP C  ?EXPECTORATED SPUTUM ASSESSMENT W GRAM STAIN, RFLX TO RESP C  ?LACTIC ACID, PLASMA  ?PROTIME-INR  ?APTT  ?BRAIN NATRIURETIC PEPTIDE  ?LACTIC ACID, PLASMA  ?BLOOD GAS, VENOUS  ?LACTIC ACID, PLASMA  ?LACTIC ACID, PLASMA  ?PROTIME-INR  ?APTT  ?CBC  ?CREATININE, SERUM  ?TROPONIN I (HIGH SENSITIVITY)  ?TROPONIN I (HIGH SENSITIVITY)  ? ? ?EKG ?EKG Interpretation ? ?Date/Time:  Friday January 18 2022 20:14:29 EDT ?Ventricular Rate:  108 ?PR Interval:  61 ?QRS Duration: 107 ?QT Interval:  360 ?QTC Calculation: 483 ?R Axis:   61 ?Text Interpretation: Sinus tachycardia Consider right atrial enlargement Nonspecific T abnrm, anterolateral leads Artifact in lead(s) II III aVR aVL aVF V1 V2 V3 V4 V5 V6 Interpretation limited secondary to artifact Technically poor tracing Confirmed by Wynona Dove [phone]) on 01/18/2022 9:19:52 PM ? ?Radiology ?CT Head Wo  Contrast ? ?Result Date: 01/18/2022 ?CLINICAL DATA:  Delirium, recent diagnosis of acute on chronic subdural hematoma EXAM: CT HEAD WITHOUT CONTRAST TECHNIQUE: Contiguous axial images were obtained from the base of the skull

## 2022-01-19 NOTE — Progress Notes (Signed)
Pt presenting with AMS. Responsive to painful stimuli. Unchanged since previous assessment on admission. Yellow MEWS protocol r/t LOC. Will continue to monitor.  ? ? 01/19/22 0800  ?Assess: MEWS Score  ?Level of Consciousness Responds to Pain  ?Assess: MEWS Score  ?MEWS Temp 0  ?MEWS Systolic 0  ?MEWS Pulse 0  ?MEWS RR 0  ?MEWS LOC 2  ?MEWS Score 2  ?MEWS Score Color Yellow  ?Assess: if the MEWS score is Yellow or Red  ?Were vital signs taken at a resting state? Yes  ?Focused Assessment No change from prior assessment  ?Does the patient meet 2 or more of the SIRS criteria? No  ?MEWS guidelines implemented *See Row Information* Yes  ?Treat  ?Pain Scale PAINAD  ?Pain Score Asleep  ?Pain Intervention(s) Repositioned  ?Breathing 0  ?Negative Vocalization 0  ?Facial Expression 0  ?Body Language 0  ?Consolability 0  ?PAINAD Score 0  ?Take Vital Signs  ?Increase Vital Sign Frequency  Yellow: Q 2hr X 2 then Q 4hr X 2, if remains yellow, continue Q 4hrs  ?Escalate  ?MEWS: Escalate Yellow: discuss with charge nurse/RN and consider discussing with provider and RRT  ?Notify: Charge Nurse/RN  ?Name of Charge Nurse/RN Notified Kim RN  ?Date Charge Nurse/RN Notified 01/19/22  ?Time Charge Nurse/RN Notified 0800  ?Document  ?Patient Outcome Other (Comment) ?(AMS unchanged since previous assessment, MD aware)  ?Progress note created (see row info) Yes  ? ?MEWS Guidelines - (patients age 40 and over) ? ? ?

## 2022-01-19 NOTE — Evaluation (Signed)
Clinical/Bedside Swallow Evaluation ?Patient Details  ?Name: Micheal [name] ?MRN: [id number] ?Date of Birth: July 05, 1956 ? ?Today's Date: 01/19/2022 ?Time: SLP Start Time (ACUTE ONLY): [id number] SLP Stop Time (ACUTE ONLY): 1406 ?SLP Time Calculation (min) (ACUTE ONLY): 19 min ? ?Past Medical History:  ?Past Medical History:  ?Diagnosis Date  ? Adjustment insomnia   ? Anxiety disorder due to known physiological condition   ? Hypertension   ? Major depressive disorder, recurrent, unspecified ([place])   ? 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 ?Past Surgical History: No past surgical history on file. ?HPI:  ?Pt is a 66 y.o. male brought to the hospital from [facility] facility for change in mental status and fever. Initially there was concerns for aspiration PNA. CXR (4/14) revealed "Patchy right basilar airspace disease, which may reflect aspiration or infection". CT head (4/14) negative for acute infarct. Previous BSE (08/07/21) revealed clinical indications of mild oral dysphagia. Dys 3/thin liquid diet recommended at that time. PMH: HTN, major depressive disorder, Frontotemporal lobe dementia, nonverbal at baseline but usually ambulates.  ?  ?Assessment / Plan / Recommendation  ?Clinical Impression ? Pt presents with s/sx of dysphagia, primarily oral suspected due to baseline cognitive deficits impacting attention and awareness to POs. Pt very distractible, messing with bed linens constantly despite cueing. Difficult to assess oral motor function given poor ability to follow commands, although no obvious asymmetry noted and he presents with adequate dentition. Ice chips attempted, but pt orally defensive. He benefitted from more automatic PO consumption with serial trials of sips of thin via straw, bites of puree and regular textures. As trials progressed, oral acceptance and swallow function became more automatic and attention to POs improved. Initial  sips of thin liquids by straw were significant for x1 overt cough. Subsequent consecutive sips were without signs of aspiration. Bites of puree and regular textures were masticated and orally cleared without difficulty. All POs were significant for intermittent oral holding, but with cues to swallow and/or presentation of additional bolus to lips, pt initiated swallow. x1 delayed overt cough exhibited post trials and question relation to POs. Recommend dys 3, thin liquid diet with full supervision for all meals. Pt is impulsive with liquids and he will require manual removal of straw for small sips. Small sips/bites, slow rate of intake, upright positioning for all meals/meds recommended. SLP to f/u for tolerance. ? ?SLP Visit Diagnosis: Dysphagia, unspecified (R13.10) ?   ?Aspiration Risk ? Mild aspiration risk;Moderate aspiration risk  ?  ?Diet Recommendation Dysphagia 3 (Mech soft);Thin liquid  ? ?Liquid Administration via: Cup;Straw ?Medication Administration: Crushed with puree ?Supervision: Full supervision/cueing for compensatory strategies ?Compensations: Minimize environmental distractions;Slow rate;Small sips/bites;Follow solids with liquid ?Postural Changes: Seated upright at 90 degrees  ?  ?Other  Recommendations Oral Care Recommendations: Oral care BID   ? ?Recommendations for follow up therapy are one component of a multi-disciplinary discharge planning process, led by the attending physician.  Recommendations may be updated based on patient status, additional functional criteria and insurance authorization. ? ?Follow up Recommendations Other (comment) (TBD)  ? ? ?  ?Assistance Recommended at Discharge Frequent or constant Supervision/Assistance  ?Functional Status Assessment Patient has had a recent decline in their functional status and demonstrates the ability to make significant improvements in function in a reasonable and predictable amount of time.  ?Frequency and Duration min 2x/week  ?2 weeks ?   ?   ? ?Prognosis Prognosis for Safe Diet Advancement:  Good ?Barriers to Reach Goals: Cognitive deficits  ? ?  ? ?Swallow Study   ?General Date of Onset: 01/18/22 ?HPI: Pt is a 66 y.o. male brought to the hospital from [facility] facility for change in mental status and fever. Initially there was concerns for aspiration PNA. CXR (4/14) revealed "Patchy right basilar airspace disease, which may reflect aspiration or infection". CT head (4/14) negative for acute infarct. Previous BSE (08/07/21) revealed clinical indications of mild oral dysphagia. Dys 3/thin liquid diet recommended at that time. PMH: HTN, major depressive disorder, Frontotemporal lobe dementia, nonverbal at baseline but usually ambulates. ?Type of Study: Bedside Swallow Evaluation ?Previous Swallow Assessment: see HPI ?Diet Prior to this Study: NPO ?Temperature Spikes Noted: Yes (101.3) ?Respiratory Status: Room air ?History of Recent Intubation: No ?Behavior/Cognition: Alert;Confused;Distractible;Requires cueing;Doesn't follow directions ?Oral Cavity Assessment: Other (comment) (difficult to assess due to oral defensiveness) ?Oral Care Completed by SLP: No ?Oral Cavity - Dentition: Adequate natural dentition ?Vision:  (difficult to assess) ?Self-Feeding Abilities: Needs assist ?Patient Positioning: Upright in bed;Postural control adequate for testing ?Baseline Vocal Quality: Normal ?Volitional Swallow: Unable to elicit  ?  ?Oral/Motor/Sensory Function Overall Oral Motor/Sensory Function: Other (comment) (difficult to assess, no obvious asymmetry noted)   ?Ice Chips Ice chips: Impaired ?Presentation: Spoon ?Oral Phase Impairments: Other (comment) (oral defensiveness)   ?Thin Liquid Thin Liquid: Impaired ?Presentation: Straw ?Oral Phase Functional Implications: Oral holding ?Pharyngeal  Phase Impairments: Cough - Immediate;Cough - Delayed;Suspected delayed Swallow  ?  ?Nectar Thick Nectar Thick Liquid: Not tested   ?Honey Thick Honey Thick Liquid:  Not tested   ?Puree Puree: Impaired ?Presentation: Spoon ?Oral Phase Functional Implications: Oral holding   ?Solid ? ? ?  Solid: Impaired ?Oral Phase Functional Implications: Oral holding  ? ?  ? ? ?Ellwood Dense, MA, CCC-SLP ?Acute Rehabilitation Services ?Office Number: [phone] ? ?Acie Fredrickson ?01/19/2022,2:27 PM ? ? ? ?

## 2022-01-20 DIAGNOSIS — J189 Pneumonia, unspecified organism: Secondary | ICD-10-CM | POA: Diagnosis not present

## 2022-01-20 DIAGNOSIS — A419 Sepsis, unspecified organism: Secondary | ICD-10-CM | POA: Diagnosis not present

## 2022-01-20 LAB — BASIC METABOLIC PANEL
Anion gap: 6 (ref 5–15)
BUN: 12 mg/dL (ref 8–23)
CO2: 27 mmol/L (ref 22–32)
Calcium: 8 mg/dL — ABNORMAL LOW (ref 8.9–10.3)
Chloride: 107 mmol/L (ref 98–111)
Creatinine, Ser: 0.48 mg/dL — ABNORMAL LOW (ref 0.61–1.24)
GFR, Estimated: >60 mL/min (ref >60)
Glucose, Bld: 117 mg/dL — ABNORMAL HIGH (ref 70–99)
Potassium: 3 mmol/L — ABNORMAL LOW (ref 3.5–5.1)
Sodium: 140 mmol/L (ref 135–145)

## 2022-01-20 LAB — CBC
HCT: 32.9 % — ABNORMAL LOW (ref 39.0–52.0)
Hemoglobin: 11 g/dL — ABNORMAL LOW (ref 13.0–17.0)
MCH: 32.7 pg (ref 26.0–34.0)
MCHC: 33.4 g/dL (ref 30.0–36.0)
MCV: 97.9 fL (ref 80.0–100.0)
Platelets: 187 10*3/uL (ref 150–400)
RBC: 3.36 MIL/uL — ABNORMAL LOW (ref 4.22–5.81)
RDW: 12.3 % (ref 11.5–15.5)
WBC: 9.5 10*3/uL (ref 4.0–10.5)
nRBC: 0 % (ref 0.0–0.2)

## 2022-01-20 LAB — MAGNESIUM: Magnesium: 2.1 mg/dL (ref 1.7–2.4)

## 2022-01-20 LAB — GLUCOSE, CAPILLARY
Glucose-Capillary: 103 mg/dL — ABNORMAL HIGH (ref 70–99)
Glucose-Capillary: 113 mg/dL — ABNORMAL HIGH (ref 70–99)
Glucose-Capillary: 122 mg/dL — ABNORMAL HIGH (ref 70–99)

## 2022-01-20 MED ORDER — LORAZEPAM 2 MG/ML IJ SOLN
0.2500 mg | Freq: Once | INTRAMUSCULAR | Status: AC
  Administered 2022-01-20: 0.25 mg via INTRAVENOUS
  Filled 2022-01-20: qty 1

## 2022-01-20 MED ORDER — POTASSIUM CHLORIDE 10 MEQ/100ML IV SOLN
10.0000 meq | INTRAVENOUS | Status: AC
  Administered 2022-01-20 (×4): 10 meq via INTRAVENOUS
  Filled 2022-01-20: qty 100

## 2022-01-20 NOTE — Progress Notes (Signed)
Pt's bed alarm went off and pt was found standing at the bedside holding on to the bedside table.  Pt was very irate and argumentative with staff when trying to get him back to bed.  Speech clear but nonsensical.  He tried grabbing staff's clothing and name tag.  He was finally assisted back to bed.  Mittens applied to both hands.  Dr. Toniann Fail made aware and order for Ativan 0.25mg  IV obtained.  Pt has made no further attempts to get out of bed thus far. ?Hilton Sinclair BSN RN CMSRN ?01/20/2022, 11:40 PM ? ?

## 2022-01-20 NOTE — Progress Notes (Signed)
?PROGRESS NOTE ? ? ? ?Micheal [name]  [id number] DOB: 08/10/1956 DOA: 01/18/2022 ?PCP: No primary care provider on file.  ? ?Brief Narrative:  ?66 year old with history of HTN, major depressive disorder, nonverbal at baseline but usually ambulates brought to the hospital from [facility] for change in mental status and fever.  Initially there was concerns of aspiration pneumonia therefore started on IV vancomycin, Maxipime and Flagyl.  CT also showed possible early small bowel obstruction and case was discussed by ED physician with Dr. Cliffton AstersWhite from general surgery who did not think patient has SBO. ? ? ?Assessment & Plan: ? Principal Problem: ?  Sepsis due to pneumonia [facility](HCC) ?  ?Sepsis secondary to aspiration pneumonia, right lower lobe ?Asymptomatic bacteriuria ?- Sepsis physiology improving.  Empiric antibiotics-vancomycin, cefepime and Flagyl ?- MRSA screen-positive ?- Bronchodilators, supplemental oxygen ?- Urine Legionella, strep antigen ?- Seen by speech and swallow-dysphagia 3 diet ?- BNP 52.4.  COVID/flu-negative ? ?Acute metabolic encephalopathy ?- Likely from underlying infection.  CT head shows acute on chronic subdural hematoma, measuring 1.5 cm, stable 4 mm rightward midline shift. Discussed with NeuroSx Leo Grosser(Kim Meyran PA and her MD), ok with continuing full dose Anticoag if needed  ? ?Acute on chronic subdural hematoma ?-This was diagnosed on 01/07/2022 where patient presented to the ED with fall.  At that time neurosurgery recommended discharging patient back to the facility as there was no acute intervention necessary. ?Discontinue subcu Lovenox, keep him on SCDs ? ?History of acute PE with right popliteal DVT, November 2022 ?- Was supposed to be on anticoagulation, currently only on Plavix?  We will need to reinvestigate this.  If he really is still on anticoagulation, will need to rediscuss options especially due to recurrent falls and subdural hematoma. Apparently was stopped back in  March 2023 due to falls.  ?LE dopplers 01/19/22 = Neg.  ? ?Major depressive disorder ?- Benztropine ? ?Essential hypertension ?- Not on any outpatient medication. ? ?Frontotemporal lobe dementia; Advance dementia ?- Expect intermittent agitation and delirium, previously placed on Cogentin 1 mg twice daily.  Episodes of delirium ? ?I have consulted pharmacy to go over his preadmission medications including anticoagulation. ? ?I have expressed he will be a good candidate for Hospice, sister will consider this. Will contact hospice team to reach out to the sister.  ? ?DVT prophylaxis: SCDs, DC Lovenox ?Code Status:  DNR- per records ?Family Communication:  Debbie updated. Now her guardian ? ?Status is: Inpatient ?Off and on agitation. Currently on Abx for Aspiration PNA.  ? ? ?Subjective: ?Per nursing staff patient was awake this morning and had his breakfast.  During my visit he was asleep and when called his name he kept his eyes closed.  Did not appear in acute distress. ? ?Examination: ? ?General exam: Laying in the bed with his eyes closed.  Does not appear to be in acute distress. ?Respiratory system: Clear to auscultation. Respiratory effort normal. ?Cardiovascular system: S1 & S2 heard, RRR. No JVD, murmurs, rubs, gallops or clicks. No pedal edema. ?Gastrointestinal system: Abdomen is nondistended, soft and nontender. No organomegaly or masses felt. Normal bowel sounds heard. ?Central nervous system: Unable to assess ?Extremities: Grossly moving all ext.  ?Skin: No rashes, lesions or ulcers ?Psychiatry: Poor judgment and insight. ? ? ? ?Objective: ?Vitals:  ? 01/19/22 [id number] 01/19/22 0829 01/19/22 2041 01/20/22 0449  ?BP:  110/63 (!) 142/78 129/72  ?Pulse:  75 92 84  ?Resp:  16 18 18   ?Temp:  97.7 ?F (36.5 ?C)  98.3 ?F (36.8 ?C) 98.1 ?F (36.7 ?C)  ?TempSrc:  Axillary Oral Oral  ?SpO2: 96%  95% 95%  ? ? ?Intake/Output Summary (Last 24 hours) at 01/20/2022 1129 ?Last data filed at 01/20/2022 0086 ?Gross per 24 hour   ?Intake 1178.35 ml  ?Output 2450 ml  ?Net -1271.65 ml  ? ?There were no vitals filed for this visit. ? ? ?Data Reviewed:  ? ?CBC: ?Recent Labs  ?Lab 01/18/22 ?2000 01/19/22 ?0007 01/20/22 ?0617  ?WBC 15.4* 12.0* 9.5  ?NEUTROABS 13.4*  --   --   ?HGB 12.3* 10.8* 11.0*  ?HCT 36.8* 31.5* 32.9*  ?MCV 98.9 99.1 97.9  ?PLT 212 172 187  ? ?Basic Metabolic Panel: ?Recent Labs  ?Lab 01/18/22 ?2000 01/19/22 ?0007 01/20/22 ?0617  ?NA 139  --  140  ?K 3.5  --  3.0*  ?CL 107  --  107  ?CO2 28  --  27  ?GLUCOSE 120*  --  117*  ?BUN 19  --  12  ?CREATININE 0.61 0.62 0.48*  ?CALCIUM 8.4*  --  8.0*  ?MG  --   --  2.1  ? ?GFR: ?Estimated Creatinine Clearance: 95.1 mL/min (A) (by C-G formula based on SCr of 0.48 mg/dL (L)). ?Liver Function Tests: ?Recent Labs  ?Lab 01/18/22 ?2000  ?AST 15  ?ALT 16  ?ALKPHOS 68  ?BILITOT 0.9  ?PROT 6.2*  ?ALBUMIN 3.3*  ? ?No results for input(s): LIPASE, AMYLASE in the last 168 hours. ?No results for input(s): AMMONIA in the last 168 hours. ?Coagulation Profile: ?Recent Labs  ?Lab 01/18/22 ?2000 01/19/22 ?0007  ?INR 1.1 1.2  ? ?Cardiac Enzymes: ?No results for input(s): CKTOTAL, CKMB, CKMBINDEX, TROPONINI in the last 168 hours. ?BNP (last 3 results) ?No results for input(s): PROBNP in the last 8760 hours. ?HbA1C: ?No results for input(s): HGBA1C in the last 72 hours. ?CBG: ?Recent Labs  ?Lab 01/19/22 ?1218 01/19/22 ?1708 01/20/22 ?0003 01/20/22 ?1115  ?GLUCAP 82 90 113* 122*  ? ?Lipid Profile: ?No results for input(s): CHOL, HDL, LDLCALC, TRIG, CHOLHDL, LDLDIRECT in the last 72 hours. ?Thyroid Function Tests: ?No results for input(s): TSH, T4TOTAL, FREET4, T3FREE, THYROIDAB in the last 72 hours. ?Anemia Panel: ?No results for input(s): VITAMINB12, FOLATE, FERRITIN, TIBC, IRON, RETICCTPCT in the last 72 hours. ?Sepsis Labs: ?Recent Labs  ?Lab 01/18/22 ?2000 01/19/22 ?0108 01/19/22 ?7619  ?LATICACIDVEN 0.7 0.9 1.0  ? ? ?Recent Results (from the past 240 hour(s))  ?Urine Culture     Status: None  (Preliminary result)  ? Collection Time: 01/18/22  7:51 PM  ? Specimen: In/Out Cath Urine  ?Result Value Ref Range Status  ? Specimen Description   Final  ?  IN/OUT CATH URINE ?Performed at [facility], [address], [place], [place] [address] ?  ? Special Requests   Final  ?  NONE ?Performed at [facility], [address], [place], [place] [address] ?  ? Culture   Final  ?  CULTURE REINCUBATED FOR BETTER GROWTH ?Performed at [facility], [address], [place], [place] [address] ?  ? Report Status PENDING  Incomplete  ?Blood Culture (routine x 2)     Status: None (Preliminary result)  ? Collection Time: 01/18/22  8:00 PM  ? Specimen: BLOOD  ?Result Value Ref Range Status  ? Specimen Description   Final  ?  BLOOD BLOOD RIGHT ARM ?Performed at [facility], [address] [place], [place] [address] ?  ? Special Requests  Final  ?  BOTTLES DRAWN AEROBIC AND ANAEROBIC Blood Culture adequate volume ?Performed at [facility], [address] [place], [place] [address] ?  ? Culture   Final  ?  NO GROWTH 2 DAYS ?Performed at [facility], [address], [place], [place] [address] ?  ? Report Status PENDING  Incomplete  ?Blood Culture (routine x 2)     Status: None (Preliminary result)  ? Collection Time: 01/18/22  8:07 PM  ? Specimen: BLOOD  ?Result Value Ref Range Status  ? Specimen Description   Final  ?  BLOOD RIGHT ANTECUBITAL ?Performed at [facility], [address], [place], [place] [address] ?  ? Special Requests   Final  ?  BOTTLES DRAWN AEROBIC AND ANAEROBIC Blood Culture adequate volume ?Performed at [facility], [address] [place], [place] [address] ?  ? Culture   Final  ?  NO GROWTH 2 DAYS ?Performed at [facility], [address], [place], [place] [address] ?  ? Report Status PENDING  Incomplete  ?Resp Panel by RT-PCR (Flu A&B, Covid) Nasopharyngeal Swab      Status: None  ? Collection Time: 01/18/22  8:15 PM  ? Specimen: Nasopharyngeal Swab; Nasopharyngeal(NP) swabs in vial transport medium  ?Result Value Ref Range Status  ? SARS Coronavirus 2 by RT PCR NEGATIVE NEGATIVE F

## 2022-01-20 NOTE — Progress Notes (Signed)
OT Cancellation Note ? ?Patient Details ?Name: Micheal [name] ?MRN: [id number] ?DOB: Apr 12, 1956 ? ? ?Cancelled Treatment:    Reason Eval/Treat Not Completed: Fatigue/lethargy limiting ability to participate ?OT to continue to follow and check back when patient is more alert. ?Emelee Rodocker OTR/L, MS ?Acute Rehabilitation Department ?Office# [phone] ?Pager# [phone] ? ? ?01/20/2022, 10:58 AM ?

## 2022-01-20 NOTE — Progress Notes (Signed)
PT Cancellation Note ? ?Patient Details ?Name: Micheal [name] ?MRN: [id number] ?DOB: 1956-09-28 ? ? ?Cancelled Treatment:     PT order received and eval attempted x 2 but pt too lethargic to participate.  RN advises pt was awake to eat breakfast but per report has periods of being difficult to arouse.  RN also reports possible hospice consult pending.  Will follow. ? ? ?[name] ?01/20/2022, 10:29 AM ?

## 2022-01-21 DIAGNOSIS — J189 Pneumonia, unspecified organism: Secondary | ICD-10-CM | POA: Diagnosis not present

## 2022-01-21 DIAGNOSIS — E43 Unspecified severe protein-calorie malnutrition: Secondary | ICD-10-CM | POA: Insufficient documentation

## 2022-01-21 DIAGNOSIS — A419 Sepsis, unspecified organism: Secondary | ICD-10-CM | POA: Diagnosis not present

## 2022-01-21 LAB — GLUCOSE, CAPILLARY
Glucose-Capillary: 107 mg/dL — ABNORMAL HIGH (ref 70–99)
Glucose-Capillary: 54 mg/dL — ABNORMAL LOW (ref 70–99)
Glucose-Capillary: 64 mg/dL — ABNORMAL LOW (ref 70–99)
Glucose-Capillary: 64 mg/dL — ABNORMAL LOW (ref 70–99)
Glucose-Capillary: 74 mg/dL (ref 70–99)
Glucose-Capillary: 80 mg/dL (ref 70–99)

## 2022-01-21 LAB — BASIC METABOLIC PANEL
Anion gap: 5 (ref 5–15)
BUN: 14 mg/dL (ref 8–23)
CO2: 25 mmol/L (ref 22–32)
Calcium: 7.9 mg/dL — ABNORMAL LOW (ref 8.9–10.3)
Chloride: 109 mmol/L (ref 98–111)
Creatinine, Ser: 0.57 mg/dL — ABNORMAL LOW (ref 0.61–1.24)
GFR, Estimated: >60 mL/min (ref >60)
Glucose, Bld: 84 mg/dL (ref 70–99)
Potassium: 3.4 mmol/L — ABNORMAL LOW (ref 3.5–5.1)
Sodium: 139 mmol/L (ref 135–145)

## 2022-01-21 LAB — MAGNESIUM: Magnesium: 2.2 mg/dL (ref 1.7–2.4)

## 2022-01-21 LAB — CBC
HCT: 34.4 % — ABNORMAL LOW (ref 39.0–52.0)
Hemoglobin: 11 g/dL — ABNORMAL LOW (ref 13.0–17.0)
MCH: 33.4 pg (ref 26.0–34.0)
MCHC: 32 g/dL (ref 30.0–36.0)
MCV: 104.6 fL — ABNORMAL HIGH (ref 80.0–100.0)
Platelets: 143 10*3/uL — ABNORMAL LOW (ref 150–400)
RBC: 3.29 MIL/uL — ABNORMAL LOW (ref 4.22–5.81)
RDW: 12.7 % (ref 11.5–15.5)
WBC: 7.2 10*3/uL (ref 4.0–10.5)
nRBC: 0 % (ref 0.0–0.2)

## 2022-01-21 LAB — URINE CULTURE: Culture: >=100000 — AB

## 2022-01-21 MED ORDER — DEXTROSE 50 % IV SOLN
INTRAVENOUS | Status: AC
  Filled 2022-01-21: qty 50

## 2022-01-21 MED ORDER — MUPIROCIN 2 % EX OINT
1.0000 "application " | TOPICAL_OINTMENT | Freq: Two times a day (BID) | CUTANEOUS | Status: AC
  Administered 2022-01-21 – 2022-01-25 (×9): 1 via NASAL
  Filled 2022-01-21: qty 22

## 2022-01-21 MED ORDER — SODIUM CHLORIDE 0.9 % IV SOLN
3.0000 g | Freq: Four times a day (QID) | INTRAVENOUS | Status: AC
  Administered 2022-01-21 – 2022-01-25 (×18): 3 g via INTRAVENOUS
  Filled 2022-01-21 (×18): qty 8

## 2022-01-21 MED ORDER — BOOST / RESOURCE BREEZE PO LIQD CUSTOM
1.0000 | Freq: Three times a day (TID) | ORAL | Status: DC
  Administered 2022-01-21 – 2022-01-23 (×5): 1 via ORAL

## 2022-01-21 MED ORDER — RISPERIDONE 0.25 MG PO TABS
0.5000 mg | ORAL_TABLET | Freq: Every day | ORAL | Status: DC
  Administered 2022-01-21 – 2022-01-29 (×9): 0.5 mg via ORAL
  Filled 2022-01-21 (×9): qty 2

## 2022-01-21 MED ORDER — ORAL CARE MOUTH RINSE
15.0000 mL | Freq: Two times a day (BID) | OROMUCOSAL | Status: DC
  Administered 2022-01-22 – 2022-01-30 (×17): 15 mL via OROMUCOSAL

## 2022-01-21 MED ORDER — LORAZEPAM 2 MG/ML IJ SOLN
0.5000 mg | INTRAMUSCULAR | Status: DC | PRN
  Administered 2022-01-30: 0.5 mg via INTRAVENOUS
  Filled 2022-01-21 (×2): qty 1

## 2022-01-21 MED ORDER — POTASSIUM CHLORIDE 20 MEQ PO PACK
40.0000 meq | PACK | Freq: Once | ORAL | Status: AC
Start: 2022-01-21 — End: 2022-01-21
  Administered 2022-01-21: 40 meq via ORAL
  Filled 2022-01-21: qty 2

## 2022-01-21 MED ORDER — GLUCAGON HCL RDNA (DIAGNOSTIC) 1 MG IJ SOLR
1.0000 mg | Freq: Once | INTRAMUSCULAR | Status: DC | PRN
  Filled 2022-01-21: qty 1

## 2022-01-21 MED ORDER — CHLORHEXIDINE GLUCONATE CLOTH 2 % EX PADS
6.0000 | MEDICATED_PAD | Freq: Every day | CUTANEOUS | Status: AC
  Administered 2022-01-22 – 2022-01-26 (×5): 6 via TOPICAL

## 2022-01-21 MED ORDER — ADULT MULTIVITAMIN W/MINERALS CH
1.0000 | ORAL_TABLET | Freq: Every day | ORAL | Status: DC
  Administered 2022-01-22 – 2022-01-30 (×8): 1 via ORAL
  Filled 2022-01-21 (×9): qty 1

## 2022-01-21 NOTE — Progress Notes (Signed)
[facility] Hospital Liaison note: ? ?Notified by Dorisann Frames of request for [facility] Palliative Care services. Will continue to follow for disposition. ? ?Please call with any outpatient palliative questions or concerns. ? ?Thank you for the opportunity to participate in this patient's care. ? ?Thank you, ?Abran Cantor, LPN ?[facility] Hospital Liaison ?[phone] ?

## 2022-01-21 NOTE — Progress Notes (Signed)
? ?  PT Cancellation Note ? ?Patient Details ?Name: Micheal [name] ?MRN: [id number] ?DOB: 04-Sep-1956 ? ? ?Cancelled Treatment:    Reason Eval/Treat Not Completed: Medical issues which prohibited therapy, CBG's are Low. ? Blanchard Kelch PT ?Acute Rehabilitation Services ?Pager [phone] ?Office [phone] ? ? ? ?Tamyia Minich, Jobe Igo ?01/21/2022, 2:25 PM ?

## 2022-01-21 NOTE — Progress Notes (Signed)
Initial Nutrition Assessment ? ?DOCUMENTATION CODES:  ? ?Severe malnutrition in context of chronic illness ? ?INTERVENTION:  ? ?-Boost Breeze po TID, each supplement provides 250 kcal and 9 grams of protein ? ?-Multivitamin with minerals daily ? ?NUTRITION DIAGNOSIS:  ? ?Severe Malnutrition related to chronic illness (dementia) as evidenced by severe fat depletion, severe muscle depletion. ? ?GOAL:  ? ?Patient will meet greater than or equal to 90% of their needs ? ?MONITOR:  ? ?PO intake, Supplement acceptance, Labs, Weight trends, I & O's ? ?REASON FOR ASSESSMENT:  ? ?Malnutrition Screening Tool ?  ? ?ASSESSMENT:  ? ?66 year old with history of HTN, major depressive disorder, nonverbal at baseline but usually ambulates brought to the hospital from [facility] facility for change in mental status and fever. ? ?Patient in room, nurse tech checking his blood sugars. Per tech, pt has not eaten this morning, seems more confused. Pt did drink 2 orange juices to help bring his blood sugar up. NT brought him another OJ with sugar added as his blood sugar was at 54.  ?Encouraged pt to drink his juice. Will order Boost Breeze supplements as well.  ? ?Pt with dementia, nonverbal. Disoriented x 4. ?Per SLP note, pt with mild-moderate aspiration risk. Recommended dysphagia 3 diet.  ?Noted that pt consumed 100% of dinner last night and 75% of breakfast yesterday.  ? ?Per weight records, pt hasn't been weighed since 12/02/21:  159 lbs ? ?Medications: D50 infusion, KLOR-CON ? ?Labs reviewed: ?CBGs: 54-107 ?Low K ? ?NUTRITION - FOCUSED PHYSICAL EXAM: ? ?Flowsheet Row Most Recent Value  ?Orbital Region Moderate depletion  ?Upper Arm Region Severe depletion  ?Thoracic and Lumbar Region Unable to assess  ?Buccal Region Severe depletion  ?Temple Region Severe depletion  ?Clavicle Bone Region Severe depletion  ?Clavicle and Acromion Bone Region Severe depletion  ?Scapular Bone Region Severe depletion  ?Dorsal Hand Unable to assess   [mittens]  ?Edema (RD Assessment) None  ?Hair Reviewed  [thin]  ?Eyes Unable to assess  ? ?  ? ? ?Diet Order:   ?Diet Order   ? ?       ?  DIET DYS 3 Room service appropriate? No; Fluid consistency: Thin  Diet effective now       ?  ? ?  ?  ? ?  ? ? ?EDUCATION NEEDS:  ? ?No education needs have been identified at this time ? ?Skin:  Skin Assessment: Reviewed RN Assessment ? ?Last BM:  PTA ? ?Height:  ? ?Ht Readings from Last 1 Encounters:  ?01/07/22 6' (1.829 m)  ? ? ?Weight:  ? ?Wt Readings from Last 1 Encounters:  ?01/07/22 73 kg  ? ? ?BMI:  There is no height or weight on file to calculate BMI. ? ?Estimated Nutritional Needs:  ? ?Kcal:  2000-2200 ? ?Protein:  105-115g ? ?Fluid:  2L/day ? ? ?Tilda Franco, MS, RD, LDN ?Inpatient Clinical Dietitian ?Contact information available via Amion ? ?

## 2022-01-21 NOTE — Progress Notes (Signed)
?PROGRESS NOTE ? ? ? ?Micheal [name]  GNF:[id number] DOB: 1955/12/12 DOA: 01/18/2022 ?PCP: No primary care provider on file.  ? ?Brief Narrative:  ?66 year old with history of HTN, major depressive disorder, nonverbal at baseline but usually ambulates brought to the hospital from [facility] facility for change in mental status and fever.  Initially there was concerns of aspiration pneumonia therefore started on IV vancomycin, Maxipime and Flagyl.  CT also showed possible early small bowel obstruction and case was discussed by ED physician with Dr. Cliffton Asters from general surgery who did not think patient has SBO.  Due to his recurrent ongoing dementia with delirium and readmissions, patient's sister is interested in discussion with palliative care. ? ? ?Assessment & Plan: ? Principal Problem: ?  Sepsis due to pneumonia [facility]) ?  ?Sepsis secondary to aspiration pneumonia, right lower lobe ?Asymptomatic bacteriuria growing MRSA ?-MRSA screen positive.  Sepsis physiology improving.  We will transition his antibiotics to IV vancomycin and oral Augmentin. ?- Bronchodilators, supplemental oxygen ?- Urine Legionella, strep antigen ?- Seen by speech and swallow-dysphagia 3 diet ?- BNP 52.4.  COVID/flu-negative ? ?Acute metabolic encephalopathy ?- Likely from underlying infection.  CT head shows acute on chronic subdural hematoma, measuring 1.5 cm, stable 4 mm rightward midline shift. Discussed with NeuroSx Leo Grosser PA and her MD), ok with continuing full dose Anticoag if needed  ? ?Acute on chronic subdural hematoma ?-This was diagnosed on 01/07/2022 where patient presented to the ED with fall.  At that time neurosurgery recommended discharging patient back to the facility as there was no acute intervention necessary. ?Discontinue subcu Lovenox, keep him on SCDs ? ?History of acute PE with right popliteal DVT, November 2022 ?-He has been taken off of anticoagulation due to risk of falling.  No evidence of shortness of breath  or hypoxia at this time. ?LE dopplers 01/19/22 = Neg.  ? ?Major depressive disorder ?- Benztropine ? ?Essential hypertension ?- Not on any outpatient medication. ? ?Frontotemporal lobe dementia; Advance dementia ?-Intermittent agitation and delirium.  On Cogentin 1 mg. ? ?I have consulted pharmacy to go over his preadmission medications including anticoagulation. ? ?Sister has expressed interest in possible hospice therefore they have been consulted.  She would also like to speak with palliative care services regarding other option. ? ?DVT prophylaxis: SCDs ?Code Status:  DNR- per records ?Family Communication:  Debbie updated. Now her guardian ? ?Status is: Inpatient ?Off and on agitation. Currently on Abx for Aspiration PNA.  ? ? ?Subjective: ?Patient seen and examined, he was agitated this morning requiring Ativan.  When I saw the patient he was awake but did not want to participate in conversation.  He was tracking me across the room. ? ?Examination: ? ?General exam: Laying in the bed with eyes open.  Mittens in place.  Tracks me across the room. ?Respiratory system: Clear to auscultation. Respiratory effort normal. ?Cardiovascular system: S1 & S2 heard, RRR. No JVD, murmurs, rubs, gallops or clicks. No pedal edema. ?Gastrointestinal system: Abdomen is nondistended, soft and nontender. No organomegaly or masses felt. Normal bowel sounds heard. ?Central nervous system: Unable to assess.  Grossly moving all of his extremities ?Extremities: Grossly moving all ext.  ?Skin: No rashes, lesions or ulcers ?Psychiatry: Poor judgment and insight. ? ? ? ?Objective: ?Vitals:  ? 01/20/22 0449 01/20/22 1326 01/20/22 2048 01/21/22 0433  ?BP: 129/72 118/90 123/80 133/88  ?Pulse: 84 96 84 76  ?Resp: 18 16 18    ?Temp: 98.1 ?F (36.7 ?C) 98.5 ?F (36.9 ?C)  98.2 ?F (36.8 ?C) 97.8 ?F (36.6 ?C)  ?TempSrc: Oral Oral Oral Oral  ?SpO2: 95% 100% 96% 100%  ? ? ?Intake/Output Summary (Last 24 hours) at 01/21/2022 1043 ?Last data filed at  01/21/2022 1014 ?Gross per 24 hour  ?Intake 1320.08 ml  ?Output 1400 ml  ?Net -79.92 ml  ? ?There were no vitals filed for this visit. ? ? ?Data Reviewed:  ? ?CBC: ?Recent Labs  ?Lab 01/18/22 ?2000 01/19/22 ?0007 01/20/22 ?0617 01/21/22 ?1829  ?WBC 15.4* 12.0* 9.5 7.2  ?NEUTROABS 13.4*  --   --   --   ?HGB 12.3* 10.8* 11.0* 11.0*  ?HCT 36.8* 31.5* 32.9* 34.4*  ?MCV 98.9 99.1 97.9 104.6*  ?PLT 212 172 187 143*  ? ?Basic Metabolic Panel: ?Recent Labs  ?Lab 01/18/22 ?2000 01/19/22 ?0007 01/20/22 ?0617 01/21/22 ?9371  ?NA 139  --  140 139  ?K 3.5  --  3.0* 3.4*  ?CL 107  --  107 109  ?CO2 28  --  27 25  ?GLUCOSE 120*  --  117* 84  ?BUN 19  --  12 14  ?CREATININE 0.61 0.62 0.48* 0.57*  ?CALCIUM 8.4*  --  8.0* 7.9*  ?MG  --   --  2.1 2.2  ? ?GFR: ?CrCl cannot be calculated (Unknown ideal weight.). ?Liver Function Tests: ?Recent Labs  ?Lab 01/18/22 ?2000  ?AST 15  ?ALT 16  ?ALKPHOS 68  ?BILITOT 0.9  ?PROT 6.2*  ?ALBUMIN 3.3*  ? ?No results for input(s): LIPASE, AMYLASE in the last 168 hours. ?No results for input(s): AMMONIA in the last 168 hours. ?Coagulation Profile: ?Recent Labs  ?Lab 01/18/22 ?2000 01/19/22 ?0007  ?INR 1.1 1.2  ? ?Cardiac Enzymes: ?No results for input(s): CKTOTAL, CKMB, CKMBINDEX, TROPONINI in the last 168 hours. ?BNP (last 3 results) ?No results for input(s): PROBNP in the last 8760 hours. ?HbA1C: ?No results for input(s): HGBA1C in the last 72 hours. ?CBG: ?Recent Labs  ?Lab 01/20/22 ?1115 01/20/22 ?1745 01/21/22 ?0013 01/21/22 ?6967 01/21/22 ?8938  ?GLUCAP 122* 103* 107* 74 64*  ? ?Lipid Profile: ?No results for input(s): CHOL, HDL, LDLCALC, TRIG, CHOLHDL, LDLDIRECT in the last 72 hours. ?Thyroid Function Tests: ?No results for input(s): TSH, T4TOTAL, FREET4, T3FREE, THYROIDAB in the last 72 hours. ?Anemia Panel: ?No results for input(s): VITAMINB12, FOLATE, FERRITIN, TIBC, IRON, RETICCTPCT in the last 72 hours. ?Sepsis Labs: ?Recent Labs  ?Lab 01/18/22 ?2000 01/19/22 ?0108 01/19/22 ?1017   ?LATICACIDVEN 0.7 0.9 1.0  ? ? ?Recent Results (from the past 240 hour(s))  ?Urine Culture     Status: Abnormal  ? Collection Time: 01/18/22  7:51 PM  ? Specimen: In/Out Cath Urine  ?Result Value Ref Range Status  ? Specimen Description   Final  ?  IN/OUT CATH URINE ?Performed at [facility], [address], [place], [place] [address] ?  ? Special Requests   Final  ?  NONE ?Performed at [facility], [address] [place], [place] [address] ?  ? Culture (A)  Final  ?  >=100,000 COLONIES/mL METHICILLIN RESISTANT STAPHYLOCOCCUS AUREUS  ? Report Status 01/21/2022 FINAL  Final  ? Organism ID, Bacteria METHICILLIN RESISTANT STAPHYLOCOCCUS AUREUS (A)  Final  ?    Susceptibility  ? Methicillin resistant staphylococcus aureus - MIC*  ?  CIPROFLOXACIN >=8 RESISTANT Resistant   ?  GENTAMICIN <=0.5 SENSITIVE Sensitive   ?  NITROFURANTOIN <=16 SENSITIVE Sensitive   ?  OXACILLIN >=4 RESISTANT Resistant   ?  TETRACYCLINE <=1  SENSITIVE Sensitive   ?  VANCOMYCIN <=0.5 SENSITIVE Sensitive   ?  TRIMETH/SULFA <=10 SENSITIVE Sensitive   ?  CLINDAMYCIN <=0.25 SENSITIVE Sensitive   ?  RIFAMPIN <=0.5 SENSITIVE Sensitive   ?  Inducible Clindamycin NEGATIVE Sensitive   ?  * >=100,000 COLONIES/mL METHICILLIN RESISTANT STAPHYLOCOCCUS AUREUS  ?Blood Culture (routine x 2)     Status: None (Preliminary result)  ? Collection Time: 01/18/22  8:00 PM  ? Specimen: BLOOD  ?Result Value Ref Range Status  ? Specimen Description   Final  ?  BLOOD BLOOD RIGHT ARM ?Performed at [facility], [address] [place], [place] [address] ?  ? Special Requests   Final  ?  BOTTLES DRAWN AEROBIC AND ANAEROBIC Blood Culture adequate volume ?Performed at [facility], [address] [place], [place] [address] ?  ? Culture   Final  ?  NO GROWTH 3 DAYS ?Performed at [facility], [address] [place], [place] [address] ?  ? Report Status PENDING  Incomplete  ?Blood Culture  (routine x 2)     Status: None (Preliminary result)  ? Collection Time: 01/18/22  8:07 PM  ? Specimen: BLOOD  ?Result Value Ref Range Status  ? Specimen Description   Final  ?  BLOOD RIGHT ANTECUBITAL ?Performed at [facility]

## 2022-01-21 NOTE — Progress Notes (Signed)
OT Cancellation Note ? ?Patient Details ?Name: Micheal [name] ?MRN: [id number] ?DOB: 1956/02/16 ? ? ?Cancelled Treatment:    Reason Eval/Treat Not Completed: Medical issues which prohibited therapy ?Patient noted to have low blood sugar reading. OT to hold and check back when patient is more medically stable.  ?Ovetta Bazzano OTR/L, MS ?Acute Rehabilitation Department ?Office# [phone] ?Pager# [phone] ? ? ?01/21/2022, 2:26 PM ?

## 2022-01-22 DIAGNOSIS — F03918 Unspecified dementia, unspecified severity, with other behavioral disturbance: Secondary | ICD-10-CM

## 2022-01-22 DIAGNOSIS — J690 Pneumonitis due to inhalation of food and vomit: Secondary | ICD-10-CM | POA: Diagnosis not present

## 2022-01-22 DIAGNOSIS — Z7189 Other specified counseling: Secondary | ICD-10-CM

## 2022-01-22 DIAGNOSIS — A419 Sepsis, unspecified organism: Secondary | ICD-10-CM | POA: Diagnosis not present

## 2022-01-22 DIAGNOSIS — J189 Pneumonia, unspecified organism: Secondary | ICD-10-CM | POA: Diagnosis not present

## 2022-01-22 DIAGNOSIS — Z515 Encounter for palliative care: Secondary | ICD-10-CM

## 2022-01-22 LAB — BASIC METABOLIC PANEL
Anion gap: 5 (ref 5–15)
BUN: 16 mg/dL (ref 8–23)
CO2: 28 mmol/L (ref 22–32)
Calcium: 8.1 mg/dL — ABNORMAL LOW (ref 8.9–10.3)
Chloride: 107 mmol/L (ref 98–111)
Creatinine, Ser: 0.46 mg/dL — ABNORMAL LOW (ref 0.61–1.24)
GFR, Estimated: >60 mL/min (ref >60)
Glucose, Bld: 115 mg/dL — ABNORMAL HIGH (ref 70–99)
Potassium: 3.7 mmol/L (ref 3.5–5.1)
Sodium: 140 mmol/L (ref 135–145)

## 2022-01-22 LAB — CBC
HCT: 36.9 % — ABNORMAL LOW (ref 39.0–52.0)
Hemoglobin: 12.3 g/dL — ABNORMAL LOW (ref 13.0–17.0)
MCH: 32.9 pg (ref 26.0–34.0)
MCHC: 33.3 g/dL (ref 30.0–36.0)
MCV: 98.7 fL (ref 80.0–100.0)
Platelets: 227 10*3/uL (ref 150–400)
RBC: 3.74 MIL/uL — ABNORMAL LOW (ref 4.22–5.81)
RDW: 12.4 % (ref 11.5–15.5)
WBC: 6.3 10*3/uL (ref 4.0–10.5)
nRBC: 0 % (ref 0.0–0.2)

## 2022-01-22 LAB — GLUCOSE, CAPILLARY
Glucose-Capillary: 107 mg/dL — ABNORMAL HIGH (ref 70–99)
Glucose-Capillary: 135 mg/dL — ABNORMAL HIGH (ref 70–99)
Glucose-Capillary: 77 mg/dL (ref 70–99)
Glucose-Capillary: 80 mg/dL (ref 70–99)
Glucose-Capillary: 92 mg/dL (ref 70–99)

## 2022-01-22 LAB — MAGNESIUM: Magnesium: 2.3 mg/dL (ref 1.7–2.4)

## 2022-01-22 NOTE — Evaluation (Signed)
Occupational Therapy Evaluation ?Patient Details ?Name: Micheal [name] ?MRN: [id number] ?DOB: 30-Mar-1956 ?Today's Date: 01/22/2022 ? ? ?History of Present Illness Pt is 66 yo male admitted 01/18/22 with sepsis due to PNE and metabolic encephalopathy, acute on chronic subdural hematoma.  Pt with hx including dementia - nonverbal, major depressive disorder, and HTN.  ? ?Clinical Impression ?  ?Patient evaluated by Occupational Therapy with no further acute OT needs identified. All education has been completed and the patient has no further questions. Patient is at baseline with current cog level. Recommended for patient to transition back to LTC. See below for any follow-up Occupational Therapy or equipment needs. OT is signing off. Thank you for this referral. ?  ?   ? ?Recommendations for follow up therapy are one component of a multi-disciplinary discharge planning process, led by the attending physician.  Recommendations may be updated based on patient status, additional functional criteria and insurance authorization.  ? ?Follow Up Recommendations ? No OT follow up  ?  ?Assistance Recommended at Discharge Frequent or constant Supervision/Assistance  ?Patient can return home with the following A lot of help with bathing/dressing/bathroom;A lot of help with walking and/or transfers;Direct supervision/assist for financial management;Help with stairs or ramp for entrance;Assist for transportation;Direct supervision/assist for medications management;Assistance with feeding;Assistance with cooking/housework ? ?  ?Functional Status Assessment ? Patient has not had a recent decline in their functional status  ?Equipment Recommendations ? None recommended by OT  ?  ?Recommendations for Other Services   ? ? ?  ?Precautions / Restrictions Precautions ?Precautions: Fall ?Restrictions ?Weight Bearing Restrictions: No  ? ?  ? ?Mobility Bed Mobility ?Overal bed mobility: Needs Assistance ?Bed Mobility: Supine to Sit, Sit to  Supine ?  ?  ?Supine to sit: Supervision ?Sit to supine: Supervision ?  ?General bed mobility comments: Initial supine to sit with mod A but due to pt not following commands. Pt performed 3 more times at end of session on his own when trying to get OOB ?  ? ?Transfers ?  ?  ?  ?  ?  ?  ?  ?  ?  ?  ?  ? ?  ?Balance Overall balance assessment: Needs assistance ?Sitting-balance support: No upper extremity supported ?Sitting balance-Leahy Scale: Good ?  ?  ?Standing balance support: Single extremity supported ?Standing balance-Leahy Scale: Poor ?Standing balance comment: Requiring HHA ?  ?  ?  ?  ?  ?  ?  ?  ?  ?  ?  ?   ? ?ADL either performed or assessed with clinical judgement  ? ?ADL Overall ADL's : At baseline ?  ?  ?  ?  ?  ?  ?  ?  ?  ?  ?  ?  ?  ?  ?  ?  ?  ?  ?  ?General ADL Comments: patient lives at [facility] for [facility] term [facility]. patient has caregiver assistance for ADLs at facility. patient is unable to follow commands consistently. no new learning available at this time. patient was resistive to participation in ADLs. patient was able to engage in functional mobility with min guard x2 with increased need for redirection. patietn appears to be at baseline.  ? ? ? ?Vision   ?Additional Comments: unable to assess  ?   ?Perception   ?  ?Praxis   ?  ? ?Pertinent Vitals/Pain Pain Assessment ?Pain Assessment: No/denies pain  ? ? ? ?Hand Dominance Left ?  ?Extremity/Trunk Assessment Upper Extremity Assessment ?Upper  Extremity Assessment: Difficult to assess due to impaired cognition;Overall [facility] for tasks assessed ?  ?Lower Extremity Assessment ?Lower Extremity Assessment: Difficult to assess due to impaired cognition;Overall WFL for tasks assessed (ROM WFL, MMT at least 3/5 but pt not following further commands) ?  ?Cervical / Trunk Assessment ?Cervical / Trunk Assessment: Normal ?  ?Communication Communication ?Communication: Expressive difficulties ?  ?Cognition Arousal/Alertness: Awake/alert ?Behavior  During Therapy: Restless ?Overall Cognitive Status: History of cognitive impairments - at baseline ?  ?  ?  ?  ?  ?  ?  ?  ?  ?  ?  ?  ?  ?  ?  ?  ?General Comments: Pt likely at baseline cogntion - hx of dementia, non-verbal.  Pt not able to follow verbal commands - did follow general mobility commands at times with tactile cues ?  ?  ?General Comments    ? ?  ?Exercises   ?  ?Shoulder Instructions    ? ? ?Home Living Family/patient expects to be discharged to:: Skilled nursing facility ?  ?  ?  ?  ?  ?  ?  ?  ?  ?  ?  ?  ?  ?  ?  ?  ?  ?  ? ?  ?Prior Functioning/Environment Prior Level of Function : Needs assist ?  ?  ?  ?  ?  ?  ?Mobility Comments: Pt unable to provide.  Pt with dementia and non-verbal.  Ambulatory per chart ?  ?  ? ?  ?  ?OT Problem List:   ?  ?   ?OT Treatment/Interventions:    ?  ?OT Goals(Current goals can be found in the care plan section) Acute Rehab OT Goals ?OT Goal Formulation: All assessment and education complete, DC therapy  ?OT Frequency:   ?  ? ?Co-evaluation PT/OT/SLP Co-Evaluation/Treatment: Yes ?Reason for Co-Treatment: For patient/therapist safety;Necessary to address cognition/behavior during functional activity ?PT goals addressed during session: Mobility/safety with mobility ?OT goals addressed during session: ADL's and self-care ?  ? ?  ?AM-PAC OT "6 Clicks" Daily Activity     ?Outcome Measure Help from another person eating meals?: A Lot ?Help from another person taking care of personal grooming?: A Lot ?Help from another person toileting, which includes using toliet, bedpan, or urinal?: Total ?Help from another person bathing (including washing, rinsing, drying)?: Total ?Help from another person to put on and taking off regular upper body clothing?: Total ?Help from another person to put on and taking off regular lower body clothing?: Total ?6 Click Score: 8 ?  ?End of Session Nurse Communication: Mobility status ? ?Activity Tolerance: Patient tolerated treatment  well ?Patient left: in bed;with call bell/phone within reach;with bed alarm set ? ?OT Visit Diagnosis: Unsteadiness on feet (R26.81);Other abnormalities of gait and mobility (R26.89);Other symptoms and signs involving cognitive function  ?              ?Time: 1019-1040 ?OT Time Calculation (min): 21 min ?Charges:  OT General Charges ?$OT Visit: 1 Visit ?OT Evaluation ?$OT Eval Low Complexity: 1 Low ? ?Catalina Salasar OTR/L, MS ?Acute Rehabilitation Department ?Office# [phone] ?Pager# [phone] ? ? ?Barnabas Lister Ephrem Carrick ?01/22/2022, 12:15 PM ?

## 2022-01-22 NOTE — Progress Notes (Signed)
Pharmacy Antibiotic Note ? ?Micheal [name] is a 66 y.o. male admitted on 01/18/2022 with  UTI and aspiration PNA .  Pharmacy has been consulted for Vanco dosing. ? ? ?ID: Sepsis - unk source (asp PNA v UTI). Con't abx to cover for both per Amin. LA WNL. No O2 requirement ?- Afebrile, WBC WNL, Scr<1 ? ?4/14 Cefepime >> 4/17 ?4/14 Vancomycin >>  ?4/17 Unasyn>> ?4/14 Metronidazole >>4/17 ? ?4/14 Resp panel: Neg ?4/14 UCx: >100,000 MRSA ?4/14 Bcx x2: NGTD ?4/15 mrsa pcr (+) ? ?Plan: ?- Vanc 1gm IV q12h (rounded Scr 0.8, TBW, AUC 457, Cmin 12) ?- Narrow to Unasyn 3g IV q6hr (f/u weight for CrCl) ?F/u LOT (Vanco day #5 for UTI), #5 abx for PNA ? ?  ? ?Temp (24hrs), Avg:97.7 ?F (36.5 ?C), Min:97.6 ?F (36.4 ?C), Max:97.8 ?F (36.6 ?C) ? ?Recent Labs  ?Lab 01/18/22 ?2000 01/19/22 ?0007 01/19/22 ?0108 01/19/22 ?6606 01/20/22 ?0617 01/21/22 ?3016 01/22/22 ?0533  ?WBC 15.4* 12.0*  --   --  9.5 7.2 6.3  ?CREATININE 0.61 0.62  --   --  0.48* 0.57* 0.46*  ?LATICACIDVEN 0.7  --  0.9 1.0  --   --   --   ?  ?CrCl cannot be calculated (Unknown ideal weight.).   ? ?No Known Allergies ? ?Micheal [name] S. Merilynn Finland, PharmD, BCPS ?Clinical Staff Pharmacist [email or web address] ? ?Merilynn Finland, [employer] ?01/22/2022 7:54 AM ? ?

## 2022-01-22 NOTE — Progress Notes (Signed)
?PROGRESS NOTE ? ? ? ?Micheal [name]  [id number] DOB: [birth year] DOA: 01/18/2022 ?PCP: No primary care provider on file.  ? ?Brief Narrative:  ?66 year old with history of HTN, major depressive disorder, nonverbal at baseline but usually ambulates brought to the hospital from [facility] facility for change in mental status and fever.  Initially there was concerns of aspiration pneumonia therefore started on IV vancomycin, Maxipime and Flagyl.  CT also showed possible early small bowel obstruction and case was discussed by ED physician with Dr. Dema Severin from general surgery who did not think patient has SBO.  Due to his recurrent ongoing dementia with delirium and readmissions, patient's sister is interested in discussion with palliative care. ? ? ?Assessment & Plan: ? Principal Problem: ?  Sepsis due to pneumonia [facility]) ?Active Problems: ?  Protein-calorie malnutrition, severe ?  ?Sepsis secondary to aspiration pneumonia, right lower lobe ?Asymptomatic bacteriuria growing MRSA ?-MRSA screen positive.  Sepsis physiology improving.  Currently on oral Augmentin, complete 7-day course. ?- Bronchodilators, supplemental oxygen ?- Seen by speech and swallow-continue dysphagia 3 diet. ?- BNP 52.4.  COVID/flu-negative ? ?Acute metabolic encephalopathy, somewhat chronic and now at baseline ?- Likely from underlying infection.  CT head shows acute on chronic subdural hematoma, measuring 1.5 cm, stable 4 mm rightward midline shift. Discussed with NeuroSx Margo Aye PA and her MD), ok with continuing full dose Anticoag if needed  ? ?Acute on chronic subdural hematoma ?-This was diagnosed on 01/07/2022 where patient presented to the ED with fall.  At that time neurosurgery recommended discharging patient back to the facility as there was no acute intervention necessary. ?Discontinue subcu Lovenox, keep him on SCDs ? ?History of acute PE with right popliteal DVT, November 2022 ?-Due to risk of falls he was taken off  anticoagulation in March 2023.   Lower extremity Dopplers here is negative for DVT. ? ?Major depressive disorder ?- Benztropine ? ?Essential hypertension ?- Not on any outpatient medication. ? ?Frontotemporal lobe dementia; Advance dementia ?-Intermittent agitation and delirium.  On Cogentin 1 mg. ? ?Sister has expressed interest in possible hospice therefore they have been consulted.  She would also like to speak with palliative care services regarding other option. ? ?DVT prophylaxis: SCDs ?Code Status:  DNR- per records ?Family Communication:  Jackelyn Poling is up to date, planning on speaking with palliative/hospice service today. ? ?Status is: Inpatient ?Off and on agitation. Currently on Abx for Aspiration PNA.  Safe disposition pending ? ? ?Subjective: ?Seen and examined at bedside, awake playing with his mittens trying to get out of it.  Does not participate in any meaningful conversation. ? ?Examination: ? ?General exam: Laying in the bed playing with his mittens may be trying to get out of it.  Does not participate in meaningful conversation.  Elderly frail ?Respiratory system: Clear to auscultation. Respiratory effort normal. ?Cardiovascular system: S1 & S2 heard, RRR. No JVD, murmurs, rubs, gallops or clicks. No pedal edema. ?Gastrointestinal system: Abdomen is nondistended, soft and nontender. No organomegaly or masses felt. Normal bowel sounds heard. ?Central nervous system: Unable to assess.  Grossly moving all of his extremities ?Extremities: Grossly moving all the extremities ?Skin: No rashes, lesions or ulcers ?Psychiatry: Poor judgment and insight ? ?Objective: ?Vitals:  ? 01/21/22 0433 01/21/22 1348 01/21/22 2038 01/22/22 0609  ?BP: 133/88 130/77 122/89 119/71  ?Pulse: 76 82 63 (!) 56  ?Resp:  18 14 16   ?Temp: 97.8 ?F (36.6 ?C) 97.6 ?F (36.4 ?C) 97.6 ?F (36.4 ?C) 97.8 ?F (36.6 ?C)  ?  TempSrc: Oral Oral Oral Oral  ?SpO2: 100% 99% 100% 100%  ? ? ?Intake/Output Summary (Last 24 hours) at 01/22/2022 1106 ?Last  data filed at 01/22/2022 0835 ?Gross per 24 hour  ?Intake 840 ml  ?Output 1700 ml  ?Net -860 ml  ? ?There were no vitals filed for this visit. ? ? ?Data Reviewed:  ? ?CBC: ?Recent Labs  ?Lab 01/18/22 ?2000 01/19/22 ?0007 01/20/22 ?0617 01/21/22 ?[id number] 01/22/22 ?0533  ?WBC 15.4* 12.0* 9.5 7.2 6.3  ?NEUTROABS 13.4*  --   --   --   --   ?HGB 12.3* 10.8* 11.0* 11.0* 12.3*  ?HCT 36.8* 31.5* 32.9* 34.4* 36.9*  ?MCV 98.9 99.1 97.9 104.6* 98.7  ?PLT 212 172 187 143* 227  ? ?Basic Metabolic Panel: ?Recent Labs  ?Lab 01/18/22 ?2000 01/19/22 ?0007 01/20/22 ?0617 01/21/22 ?[id number] 01/22/22 ?0533  ?NA 139  --  140 139 140  ?K 3.5  --  3.0* 3.4* 3.7  ?CL 107  --  107 109 107  ?CO2 28  --  27 25 28   ?GLUCOSE 120*  --  117* 84 115*  ?BUN 19  --  12 14 16   ?CREATININE 0.61 0.62 0.48* 0.57* 0.46*  ?CALCIUM 8.4*  --  8.0* 7.9* 8.1*  ?MG  --   --  2.1 2.2 2.3  ? ?GFR: ?CrCl cannot be calculated (Unknown ideal weight.). ?Liver Function Tests: ?Recent Labs  ?Lab 01/18/22 ?2000  ?AST 15  ?ALT 16  ?ALKPHOS 68  ?BILITOT 0.9  ?PROT 6.2*  ?ALBUMIN 3.3*  ? ?No results for input(s): LIPASE, AMYLASE in the last 168 hours. ?No results for input(s): AMMONIA in the last 168 hours. ?Coagulation Profile: ?Recent Labs  ?Lab 01/18/22 ?2000 01/19/22 ?0007  ?INR 1.1 1.2  ? ?Cardiac Enzymes: ?No results for input(s): CKTOTAL, CKMB, CKMBINDEX, TROPONINI in the last 168 hours. ?BNP (last 3 results) ?No results for input(s): PROBNP in the last 8760 hours. ?HbA1C: ?No results for input(s): HGBA1C in the last 72 hours. ?CBG: ?Recent Labs  ?Lab 01/21/22 ?1137 01/21/22 ?1246 01/21/22 ?1722 01/22/22 ?0011 01/22/22 ?[id number]  ?GLUCAP 54* 64* 80 92 107*  ? ?Lipid Profile: ?No results for input(s): CHOL, HDL, LDLCALC, TRIG, CHOLHDL, LDLDIRECT in the last 72 hours. ?Thyroid Function Tests: ?No results for input(s): TSH, T4TOTAL, FREET4, T3FREE, THYROIDAB in the last 72 hours. ?Anemia Panel: ?No results for input(s): VITAMINB12, FOLATE, FERRITIN, TIBC, IRON, RETICCTPCT in the  last 72 hours. ?Sepsis Labs: ?Recent Labs  ?Lab 01/18/22 ?2000 01/19/22 ?0108 01/19/22 ?[id number]  ?LATICACIDVEN 0.7 0.9 1.0  ? ? ?Recent Results (from the past 240 hour(s))  ?Urine Culture     Status: Abnormal  ? Collection Time: 01/18/22  7:51 PM  ? Specimen: In/Out Cath Urine  ?Result Value Ref Range Status  ? Specimen Description   Final  ?  IN/OUT CATH URINE ?Performed at [facility] [place] [address] [place], [place] [address] ?  ? Special Requests   Final  ?  NONE ?Performed at [facility], [place] [address], [place], [place] [address] ?  ? Culture (A)  Final  ?  >=100,000 COLONIES/mL METHICILLIN RESISTANT STAPHYLOCOCCUS AUREUS  ? Report Status 01/21/2022 FINAL  Final  ? Organism ID, Bacteria METHICILLIN RESISTANT STAPHYLOCOCCUS AUREUS (A)  Final  ?    Susceptibility  ? Methicillin resistant staphylococcus aureus - MIC*  ?  CIPROFLOXACIN >=8 RESISTANT Resistant   ?  GENTAMICIN <=0.5 SENSITIVE Sensitive   ?  NITROFURANTOIN <=16 SENSITIVE Sensitive   ?  OXACILLIN >=4 RESISTANT Resistant   ?  TETRACYCLINE <=1 SENSITIVE Sensitive   ?  VANCOMYCIN <=0.5 SENSITIVE Sensitive   ?  TRIMETH/SULFA <=10 SENSITIVE Sensitive   ?  CLINDAMYCIN <=0.25 SENSITIVE Sensitive   ?  RIFAMPIN <=0.5 SENSITIVE Sensitive   ?  Inducible Clindamycin NEGATIVE Sensitive   ?  * >=100,000 COLONIES/mL METHICILLIN RESISTANT STAPHYLOCOCCUS AUREUS  ?Blood Culture (routine x 2)     Status: None (Preliminary result)  ? Collection Time: 01/18/22  8:00 PM  ? Specimen: BLOOD  ?Result Value Ref Range Status  ? Specimen Description   Final  ?  BLOOD BLOOD RIGHT ARM ?Performed at [facility], [place] [address] [place], [place] [address] ?  ? Special Requests   Final  ?  BOTTLES DRAWN AEROBIC AND ANAEROBIC Blood Culture adequate volume ?Performed at [facility], [place] [address] [place], [place] [address] ?  ? Culture   Final  ?  NO GROWTH 4 DAYS ?Performed at [facility], [place] [address], [place], [place] [address] ?  ? Report Status PENDING  Incomplete  ?Blood Culture (routine x 2)     Status: None (Preliminary result)  ? Collection Time: 01/18/22  8:07 PM  ? Specimen: BLOOD  ?Result Val

## 2022-01-22 NOTE — Care Management Important Message (Signed)
Important Message ? ?Patient Details IM Letter placed in Patients room. ?Name: Micheal [name] ?MRN: [id number] ?Date of Birth: 07-27-1956 ? ? ?Medicare Important Message Given:  Yes ? ? ? ? ?Caren Macadam ?01/22/2022, 1:15 PM ?

## 2022-01-22 NOTE — Evaluation (Signed)
Physical Therapy Evaluation ?Patient Details ?Name: Micheal [name] ?MRN: [id number] ?DOB: [birth year] ?Today's Date: 01/22/2022 ? ?History of Present Illness ? Pt is 66 yo male admitted 01/18/22 with sepsis due to PNE and metabolic encephalopathy, acute on chronic subdural hematoma.  Pt with hx including dementia - nonverbal, major depressive disorder, and HTN.  ?Clinical Impression ? Pt admitted with above diagnosis. At baseline, pt is non-verbal, dementia, from memory care but ambulatory per chart.  Today, pt requiring mod multimodal cues for transfers - limited by cognition, but with increased time pt able to ambulate 300' with HHA to steady and guide.  Had difficulty getting pt to return to bed due to cognition.  Pt with limited rehab potential but does have high risk of deterioration if not mobilized - will maintain on caseload to prevent loss of mobility while hospitalized. Pt currently with functional limitations due to the deficits listed below (see PT Problem List). Pt will benefit from skilled PT to increase their independence and safety with mobility to allow discharge to the venue listed below.   ?   ?   ? ?Recommendations for follow up therapy are one component of a multi-disciplinary discharge planning process, led by the attending physician.  Recommendations may be updated based on patient status, additional functional criteria and insurance authorization. ? ?Follow Up Recommendations Long-term institutional care without follow-up therapy (return to memory care) ? ?  ?Assistance Recommended at Discharge Frequent or constant Supervision/Assistance  ?Patient can return home with the following ? A little help with walking and/or transfers;A little help with bathing/dressing/bathroom ? ?  ?Equipment Recommendations None recommended by PT  ?Recommendations for Other Services ?    ?  ?Functional Status Assessment Patient has had a recent decline in their functional status and/or demonstrates limited ability to  make significant improvements in function in a reasonable and predictable amount of time  ? ?  ?Precautions / Restrictions Precautions ?Precautions: Fall  ? ?  ? ?Mobility ? Bed Mobility ?Overal bed mobility: Needs Assistance ?Bed Mobility: Supine to Sit, Sit to Supine ?  ?  ?Supine to sit: Supervision ?Sit to supine: Supervision ?  ?General bed mobility comments: Initial supine to sit with mod A but due to pt not following commands. Pt performed 3 more times at end of session on his own when trying to get OOB ?  ? ?Transfers ?Overall transfer level: Needs assistance ?Equipment used: 2 person hand held assist ?Transfers: Sit to/from Stand ?Sit to Stand: Mod assist ?  ?  ?  ?  ?  ?General transfer comment: Mod A to stand with tactile cues and HHA of 2 - again limited by not following commands.  Pt observed yesterday standing on his own at EOB. ?  ? ?Ambulation/Gait ?Ambulation/Gait assistance: Min assist, +2 safety/equipment ?Gait Distance (Feet): 300 Feet ?Assistive device: 1 person hand held assist ?Gait Pattern/deviations: Step-to pattern, Decreased stride length, Narrow base of support ?Gait velocity: normal ?  ?  ?General Gait Details: Started with HHA of 2 progressed to HHA of 1.  HHA for guiding and direction.  Requiring increased guidance with HHA for returning to room and back to bed ? ?Stairs ?  ?  ?  ?  ?  ? ?Wheelchair Mobility ?  ? ?Modified Rankin (Stroke Patients Only) ?  ? ?  ? ?Balance Overall balance assessment: Needs assistance ?Sitting-balance support: No upper extremity supported ?Sitting balance-Leahy Scale: Good ?  ?  ?Standing balance support: Single extremity supported ?Standing balance-Leahy Scale: Poor ?  Standing balance comment: Requiring HHA ?  ?  ?  ?  ?  ?  ?  ?  ?  ?  ?  ?   ? ? ? ?Pertinent Vitals/Pain Pain Assessment ?Pain Assessment: No/denies pain  ? ? ?Home Living Family/patient expects to be discharged to:: Skilled nursing facility [facility]) ?  ?  ?  ?  ?  ?  ?   ?  ?  ?   ?  ?Prior Function Prior Level of Function : Needs assist ?  ?  ?  ?  ?  ?  ?Mobility Comments: Pt unable to provide.  Pt with dementia and non-verbal.  Ambulatory per chart ?  ?  ? ? ?Hand Dominance  ?   ? ?  ?Extremity/Trunk Assessment  ? Upper Extremity Assessment ?Upper Extremity Assessment: Defer to OT evaluation ?  ? ?Lower Extremity Assessment ?Lower Extremity Assessment: Difficult to assess due to impaired cognition;Overall WFL for tasks assessed (ROM WFL, MMT at least 3/5 but pt not following further commands) ?  ? ?Cervical / Trunk Assessment ?Cervical / Trunk Assessment: Normal  ?Communication  ? Communication: Expressive difficulties  ?Cognition Arousal/Alertness: Awake/alert ?Behavior During Therapy: Restless ?Overall Cognitive Status: History of cognitive impairments - at baseline ?  ?  ?  ?  ?  ?  ?  ?  ?  ?  ?  ?  ?  ?  ?  ?  ?General Comments: Pt likely at baseline cogntion - hx of dementia, non-verbal.  Pt not able to follow verbal commands - did follow general mobility commands at times with tactile cues ?  ?  ? ?  ?General Comments   ? ?  ?Exercises    ? ?Assessment/Plan  ?  ?PT Assessment Patient needs continued PT services  ?PT Problem List Decreased strength;Decreased mobility;Decreased safety awareness;Decreased coordination;Decreased balance;Decreased knowledge of use of DME ? ?   ?  ?PT Treatment Interventions DME instruction;Therapeutic activities;Gait training;Therapeutic exercise;Patient/family education;Balance training;Functional mobility training   ? ?PT Goals (Current goals can be found in the Care Plan section)  ?Acute Rehab PT Goals ?Patient Stated Goal: unable ?PT Goal Formulation: Patient unable to participate in goal setting ?Time For Goal Achievement: 02/05/22 ?Potential to Achieve Goals: Fair ? ?  ?Frequency Min 2X/week ?  ? ? ?Co-evaluation PT/OT/SLP Co-Evaluation/Treatment: Yes ?Reason for Co-Treatment: For patient/therapist safety;Necessary to address  cognition/behavior during functional activity ?PT goals addressed during session: Mobility/safety with mobility ?  ?  ? ? ?  ?AM-PAC PT "6 Clicks" Mobility  ?Outcome Measure Help needed turning from your back to your side while in a flat bed without using bedrails?: A Lot (mod cues for all) ?Help needed moving from lying on your back to sitting on the side of a flat bed without using bedrails?: A Lot ?Help needed moving to and from a bed to a chair (including a wheelchair)?: A Lot ?Help needed standing up from a chair using your arms (e.g., wheelchair or bedside chair)?: A Lot ?Help needed to walk in hospital room?: A Lot ?Help needed climbing 3-5 steps with a railing? : A Lot ?6 Click Score: 12 ? ?  ?End of Session Equipment Utilized During Treatment: Gait belt ?Activity Tolerance: Patient tolerated treatment well;Other (comment) (limited due to cognition) ?Patient left: in bed;with call bell/phone within reach;with bed alarm set (mittens in place; pt initially trying to get OOB multiple times but was able to get him to lay down with increased time; nursing  made aware and alarm is on) ?Nurse Communication: Mobility status ?PT Visit Diagnosis: Other abnormalities of gait and mobility (R26.89);Muscle weakness (generalized) (M62.81) ?  ? ?Time: 1019-1040 ?PT Time Calculation (min) (ACUTE ONLY): 21 min ? ? ?Charges:   PT Evaluation ?$PT Eval Low Complexity: 1 Low ?  ?  ?   ? ? ?Abran Richard, PT ?Acute Rehab Services ?Pager [phone] ?[facility] [id number] ? ? ?Micheal [name] ?01/22/2022, 11:54 AM ? ?

## 2022-01-23 DIAGNOSIS — Z86711 Personal history of pulmonary embolism: Secondary | ICD-10-CM

## 2022-01-23 DIAGNOSIS — R4182 Altered mental status, unspecified: Secondary | ICD-10-CM | POA: Diagnosis not present

## 2022-01-23 DIAGNOSIS — I6201 Nontraumatic acute subdural hemorrhage: Secondary | ICD-10-CM

## 2022-01-23 DIAGNOSIS — G3109 Other frontotemporal dementia: Secondary | ICD-10-CM | POA: Diagnosis not present

## 2022-01-23 DIAGNOSIS — A419 Sepsis, unspecified organism: Secondary | ICD-10-CM | POA: Diagnosis not present

## 2022-01-23 DIAGNOSIS — F0280 Dementia in other diseases classified elsewhere without behavioral disturbance: Secondary | ICD-10-CM

## 2022-01-23 DIAGNOSIS — Z7189 Other specified counseling: Secondary | ICD-10-CM | POA: Diagnosis not present

## 2022-01-23 DIAGNOSIS — F329 Major depressive disorder, single episode, unspecified: Secondary | ICD-10-CM

## 2022-01-23 DIAGNOSIS — J189 Pneumonia, unspecified organism: Secondary | ICD-10-CM | POA: Diagnosis not present

## 2022-01-23 LAB — BASIC METABOLIC PANEL
Anion gap: 6 (ref 5–15)
BUN: 11 mg/dL (ref 8–23)
CO2: 30 mmol/L (ref 22–32)
Calcium: 8.1 mg/dL — ABNORMAL LOW (ref 8.9–10.3)
Chloride: 106 mmol/L (ref 98–111)
Creatinine, Ser: 0.59 mg/dL — ABNORMAL LOW (ref 0.61–1.24)
GFR, Estimated: >60 mL/min (ref >60)
Glucose, Bld: 93 mg/dL (ref 70–99)
Potassium: 3.8 mmol/L (ref 3.5–5.1)
Sodium: 142 mmol/L (ref 135–145)

## 2022-01-23 LAB — CULTURE, BLOOD (ROUTINE X 2)
Culture: NO GROWTH
Culture: NO GROWTH
Special Requests: ADEQUATE
Special Requests: ADEQUATE

## 2022-01-23 LAB — CBC
HCT: 41 % (ref 39.0–52.0)
Hemoglobin: 13.5 g/dL (ref 13.0–17.0)
MCH: 32.2 pg (ref 26.0–34.0)
MCHC: 32.9 g/dL (ref 30.0–36.0)
MCV: 97.9 fL (ref 80.0–100.0)
Platelets: 241 10*3/uL (ref 150–400)
RBC: 4.19 MIL/uL — ABNORMAL LOW (ref 4.22–5.81)
RDW: 12.6 % (ref 11.5–15.5)
WBC: 5.8 10*3/uL (ref 4.0–10.5)
nRBC: 0 % (ref 0.0–0.2)

## 2022-01-23 LAB — GLUCOSE, CAPILLARY
Glucose-Capillary: 60 mg/dL — ABNORMAL LOW (ref 70–99)
Glucose-Capillary: 65 mg/dL — ABNORMAL LOW (ref 70–99)
Glucose-Capillary: 80 mg/dL (ref 70–99)
Glucose-Capillary: 82 mg/dL (ref 70–99)
Glucose-Capillary: 64 mg/dL — ABNORMAL LOW (ref 70–99)
Glucose-Capillary: 78 mg/dL (ref 70–99)

## 2022-01-23 LAB — MAGNESIUM: Magnesium: 2.2 mg/dL (ref 1.7–2.4)

## 2022-01-23 MED ORDER — GLUCOSE 40 % PO GEL
ORAL | Status: AC
  Administered 2022-01-23: 1 via ORAL
  Filled 2022-01-23: qty 1.21

## 2022-01-23 MED ORDER — DEXTROSE IN LACTATED RINGERS 5 % IV SOLN: INTRAVENOUS | Status: DC

## 2022-01-23 MED ORDER — DEXTROSE 50 % IV SOLN: 25.0000 mL | Freq: Once | INTRAVENOUS | Status: DC

## 2022-01-23 NOTE — Progress Notes (Signed)
SLP Cancellation Note ? ?Patient Details ?Name: Micheal [name] ?MRN: [id number] ?DOB: June 29, 1956 ? ? ?Cancelled treatment:       Reason Eval/Treat Not Completed: Other (comment);Fatigue/lethargy limiting ability to participate  Pt not adequately alert for po despite gentle sternal rub.  NT reports pt did not eat breakfast due to AMS.   Note palliative consult documentation.   ? ?Rolena Infante, MS [facility] SLP ?Acute Rehab Services ?Office [phone] ?Pager [phone] ? ? ? ?Micheal [name] ?01/23/2022, 9:29 AM ?

## 2022-01-23 NOTE — TOC Progression Note (Addendum)
Transition of Care (TOC) - Progression Note  ? ? ?Patient Details  ?Name: Micheal [name] ?MRN: [id number] ?Date of Birth: [birth year] ? ?Transition of Care (TOC) CM/SW Contact  ?Darcell Yacoub, Marjie Skiff, RN ?Phone Number: ?01/23/2022, 11:32 AM ? ?Clinical Narrative:    ?Pt is from [facility] and can return there at dc. This was confirmed with Crystal at [facility]. FL2 started. TOC will continue to follow. ? ?When speaking with pt sister Jackelyn Poling today along with Palliative MD she was informed that pt would need to go back to [facility] at dc because this is his long term care facility. Pt is not rehabable to go to SNF as he is unable to retain information.  When PT worked with him yesterday they recommended that he go back to memory care based on their assessment of his mobility. Debbie also had Jacqulyn Cane from [employer] on the phone during this conversation and she received the same information. They were both told that if they wanted the pt to go into another long term care facility that would need to be done outpatient as we cannot keep pt in the hospital due to that being a lengthy process.  ? ?  ?  ?Readmission Risk Interventions ?   ? View : No data to display.  ?  ?  ?  ? ? ?

## 2022-01-23 NOTE — Progress Notes (Signed)
CBG 80. Will continue to monitor.  ?

## 2022-01-23 NOTE — Assessment & Plan Note (Addendum)
With delirium, now reportedly close to baseline ?CT head with decrease in size of acute on chronic L subdural hematoma (discussed by prior MD with nsgy, ok for anticoagulation prn) ?Delirium precautions ?Prn ativan ?Scheduled risperdal, cogentin, prn trazodone ?At home on long acting injectable haldol and cogentin - per palliative care, recommended discussion long term regarding appropriateness and whether another med like risperdal (which would allow d/c cogentin) may be better option - considering risk of falls.  At this point, would recommend considering discontinuing long acting haldol at discharge and considering Risperdal instead.   ? ?Initially planning to discharge back to [facility].  Sister [name] and the cooperation of guardianship has concerns about him returning.  When I spoke to them on Friday (4/21), main concerns seem to have been his inability to feed himself and the change in his ability to ambulate.  I spoke with [facility] Friday, they noted before he came to hospital, he was Zayan Delvecchio frequent walker, he was getting up and walking without assistance and without any assistive device and was able to feed himself (double portion finger food).  I asked about their ability to help and assist with ambulating, etc and on Friday (4/21) was told essentially there was no 1:1 care.  On Friday, it was reported to me he was feeding himself, but this was not the case over the weekend - he's requiring total assistance here -> per discussion with nursing this has improved today on finger foods.  He walked 150' with mod Shelby Anderle with R drifting (with assist of 2 for safety) on 4/21 (staff from [facility] later watched him on video and they thought he'd be ok to return).  At this time, [place] willing for him to come back with the noted concerns (and believes they'll be able to manage him), but family and cooperation of guardianship still has significant concerns regarding his change in status and need  for higher level of care.  I appreciate assistance of TOC with difficult case, they've faxed him out to local locked facilities, will see if anything comes available.     ?

## 2022-01-23 NOTE — Assessment & Plan Note (Signed)
Noted  

## 2022-01-23 NOTE — Assessment & Plan Note (Addendum)
Planning for outpatient palliative care ?

## 2022-01-23 NOTE — Consult Note (Signed)
? ?                                                                                ?Consultation Note ?Date: 01/23/2022  ? ?Patient Name: Micheal [name]  ?DOB: 1956-09-07  MRN: [id number]  Age / Sex: 66 y.o., male  ?PCP: No primary care provider on file. ?Referring Physician: Zigmund DanielPowell, A Caldwell Jr., * ? ?Reason for Consultation: Establishing goals of care ? ?HPI/Patient Profile: 66 y.o. male  with past medical history of hypertension, depressive disorder, documented to be nonverbal at baseline, [place] with concern for aspiration pneumonia and concern for bowel obstruction.  Surgery discussed with ED physician and feel not likely this is SBO.  Recommendation from primary hospitalist to his sister has been for hospice at time of discharge.  Palliative consulted for goals of care. ? ?Clinical Assessment and Goals of Care: ?Palliative care consult received.  Chart reviewed including personal review of pertinent labs and imaging. ? ?I saw and examined Micheal [name] earlier in the day.  At that point, he did not speak with me and intermittently tracked me in the room. ? ?I then saw Micheal [name] again in the afternoon with his sister, Micheal [name], was also present. ? ?Micheal [name] reports that she became his guardian whenever his wife died in the fall.  She reports he has always been a very private person and they have not had any discussions about with his wishes would be for his medical care in the future. ? ?We discussed her understanding of his situation and she knows that he has long-term, terminal illness with his advanced dementia.  Discussed changes in his nutrition, cognition, and functional status in regard to dementia progression and how these will continue to change in the future. ? ?We discussed difference between palliative care and hospice care.  She expressed that she did not feel that he was "there yet" in regard to consideration for hospice services as she understands  this would be for somebody in the last 6 months of her life.  She again expressed that he has not ever shared his thoughts on his wishes for medical care with her in the past. ? ?We discussed the goal of the hospital being to provide quality time outside of the hospital and concerned about his recurrent readmissions.  It does appear this is first his mission for aspiration event and prior admissions were for falls. ? ?SUMMARY OF RECOMMENDATIONS   ?-DNR/DNI ?-Initial discussions regarding goals of care and differences between hospice and palliative care. ?-Provided copy of hard choices for loving people for her to review. ?-She has meeting set up with outside care management resources for tomorrow at 11 AM.  She requested that I call her at this time to see if there are other questions I can answer to help determine what is the best long-term care plan for Micheal [name]. ? ?Discharge Planning: To be determined ? ?  ? ?Primary Diagnoses: ?Present on Admission: ? Sepsis due to pneumonia [facility](HCC) ? ? ?I have reviewed the medical record, interviewed the patient and family, and examined the patient. The following aspects are pertinent. ? ?Past Medical History:  ?  Diagnosis Date  ? Adjustment insomnia   ? Anxiety disorder due to known physiological condition   ? Hypertension   ? Major depressive disorder, recurrent, unspecified (HCC)   ? 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 Other frontotemporal neurocognitive disorder (HCC)   ? ?Social History  ? ?Socioeconomic History  ? Marital status: Widowed  ?  Spouse name: Not on file  ? Number of children: Not on file  ? Years of education: Not on file  ? Highest education level: Not on file  ?Occupational History  ? Not on file  ?Tobacco Use  ? Smoking status: Never  ? Smokeless tobacco: Never  ?Vaping Use  ? Vaping Use: Never used  ?Substance and Sexual Activity  ? Alcohol use: Never  ? Drug use: Never  ? Sexual activity: Not on file  ?Other Topics Concern  ? Not on  file  ?Social History Narrative  ? Pt is right handed  ? Lives in 2 story home with his wife, Micheal [name]  ? Has 2 adult children  ? Bachelors Degree in IT security  ? Currently working as Market researcher with [employer]  ? ?Social Determinants of Health  ? ?Financial Resource Strain: Not on file  ?Food Insecurity: Not on file  ?Transportation Needs: Not on file  ?Physical Activity: Not on file  ?Stress: Not on file  ?Social Connections: Not on file  ? ?Family History  ?Problem Relation Age of Onset  ? Dementia Father   ? Parkinsonism Father   ? Dementia Paternal Aunt   ? Dementia Paternal Uncle   ? Dementia Paternal Grandfather   ? ?Scheduled Meds: ? benztropine  1 mg Oral BID  ? Chlorhexidine Gluconate Cloth  6 each Topical Q0600  ? dextrose  25 mL Intravenous Once  ? feeding supplement  1 Container Oral TID BM  ? guaiFENesin  600 mg Oral BID  ? mouth rinse  15 mL Mouth Rinse BID  ? multivitamin with minerals  1 tablet Oral Daily  ? mupirocin ointment  1 application. Nasal BID  ? risperiDONE  0.5 mg Oral QHS  ? ?Continuous Infusions: ? ampicillin-sulbactam (UNASYN) IV 3 g (01/23/22 0730)  ? dextrose 5% lactated ringers 50 mL/hr at 01/23/22 0800  ? vancomycin Stopped (01/22/22 2334)  ? ?PRN Meds:.acetaminophen **OR** acetaminophen, dextromethorphan-guaiFENesin, glucagon (human recombinant), hydrALAZINE, HYDROcodone-acetaminophen, ipratropium-albuterol, LORazepam, morphine injection, ondansetron **OR** ondansetron (ZOFRAN) IV, senna-docusate, traZODone ?Medications Prior to Admission:  ?Prior to Admission medications   ?Medication Sig Start Date End Date Taking? Authorizing Provider  ?acetaminophen (TYLENOL) 325 MG tablet Take 2 tablets (650 mg total) by mouth every 4 (four) hours as needed for mild pain (or temp > 37.5 C (99.5 F)). 08/14/21  Yes Lonia Blood, MD  ?acetaminophen (TYLENOL) 500 MG tablet Take 500 mg by mouth every 6 (six) hours as needed for headache, fever or mild pain.   Yes [provider]  ?alum & mag hydroxide-simeth (MAALOX/MYLANTA) 200-200-20 MG/5ML suspension Take 30 mLs by mouth every 6 (six) hours as needed for indigestion or heartburn.   Yes [provider]  ?benztropine (COGENTIN) 1 MG tablet Take 1 tablet (1 mg total) by mouth 2 (two) times daily. 08/14/21  Yes Lonia Blood, MD  ?guaifenesin (ROBITUSSIN) 100 MG/5ML syrup Take 200 mg by mouth every 6 (six) hours as needed for cough.   Yes [provider]  ?haloperidol decanoate (HALDOL DECANOATE) 50 MG/ML injection Inject 50 mg into the muscle every 28 (twenty-eight)  days.   Yes [provider]  ?loperamide (IMODIUM) 2 MG capsule Take 2 mg by mouth as needed for diarrhea or loose stools (max 8 doses/ 24 hours).   Yes [provider]  ?magnesium hydroxide (MILK OF MAGNESIA) 400 MG/5ML suspension Take 30 mLs by mouth at bedtime as needed (constipation).   Yes [provider]  ?Neomycin-Bacitracin-Polymyxin (TRIPLE ANTIBIOTIC) [phone] OINT Apply 1 application topically daily as needed (minor skin tears/abrasians).   Yes [provider]  ?NON FORMULARY Take 1 Can by mouth 3 (three) times daily. Mighty Shakes   Yes [provider]  ?nystatin (MYCOSTATIN/NYSTOP) powder Apply 1 application topically 2 (two) times daily.   Yes [provider]  ?polyethylene glycol (MIRALAX / GLYCOLAX) 17 g packet Take 17 g by mouth daily as needed for mild constipation. ?Patient taking differently: Take 17 g by mouth daily as needed (constipation). 03/07/20  Yes Zannie Cove, MD  ?clopidogrel (PLAVIX) 75 MG tablet Take 75 mg by mouth daily. ?Patient not taking: Reported on 01/18/2022    [provider]  ? ?No Known Allergies ?Review of Systems ?Unable to obtain ? ?Physical Exam ?General: Alert, awake, in no acute distress.  Minimal interaction. ?HEENT: No bruits, no goiter, no JVD ?Heart: Regular rate and rhythm. No murmur appreciated. ?Lungs: Good air movement,  clear ?Abdomen: Soft, nontender, nondistended, positive bowel sounds.   ?Ext: No significant edema ?Skin: Warm and dry ?Neuro: Grossly intact, nonfocal. ? ? ?Vital Signs: BP 129/89 (BP Location: Left Arm)   Pulse

## 2022-01-23 NOTE — Hospital Course (Addendum)
66 year old with history of HTN, major depressive disorder, nonverbal at baseline but usually ambulates brought to the hospital from [facility] facility for change in mental status and fever.  Initially there was concerns of aspiration pneumonia therefore started on IV vancomycin, Maxipime and Flagyl.  CT also showed possible early small bowel obstruction and case was discussed by ED physician with Dr. Cliffton Asters from general surgery who did not think patient has SBO.  Due to his recurrent ongoing dementia with delirium and readmissions, patient's sister is interested in discussion with palliative care.  Urine culture showed MRSA UTI. ? ?At this time, medically ready for discharge.  [facility] willing for him to return despite decline.  We've faxed him out to local locked SNF's due to concern from family and cooperation of guardianship.  If no acceptance, will likely return to [facility].   ?  ?

## 2022-01-23 NOTE — Progress Notes (Signed)
Hypoglycemic Event ? ?CBG: 65 ? ?Treatment: 1 tube glucose gel ? ?Symptoms: None ? ? ?Possible Reasons for Event: Inadequate meal intake ? ?Comments/MD notified: Hospitalist notified, informed to give glucose gel if will eat it. If not, order placed for IV dextrose. Will inform oncoming nurse. ? ? ? ?[place] ? ? ?

## 2022-01-23 NOTE — Assessment & Plan Note (Signed)
Hx PE and right popliteal DVT, Nov 2022 ?No longer on anticoagulation due to fall risk ?LE [name] negative here for DVT ?

## 2022-01-23 NOTE — Assessment & Plan Note (Addendum)
CT with findings c/w aspiration pneumonia ?MRSA PCR positive ?Negative covid, influenza ?Urine strep, legionella ?Blood culture NGx5 ?No sputum cx able to be collected to this point ?Currently on vanc/unasyn.  He's completed 8 days unasyn.  Will continue vanc in house and plan on Discharging with linezolid due to MRSA UTI.  (vanc 4/14 - present).  **Attention to linezolid rx on discharge, this will need to be adjusted for 14 day total abx course (3 more days)** ? ?

## 2022-01-23 NOTE — Progress Notes (Signed)
?PROGRESS NOTE ? ? ? ?Micheal [name]  [id number] DOB: 1955/10/31 DOA: 01/18/2022 ?PCP: No primary care provider on file.  ?Chief Complaint  ?Patient presents with  ? Altered Mental Status  ? Fever  ?  Pt arrived via EMS from [facility] facility. Staff reported pt has been altered from baseline, pt usually ambulatory.  ? ? ?Brief Narrative:  ?66 year old with history of HTN, major depressive disorder, nonverbal at baseline but usually ambulates brought to the hospital from [facility] facility for change in mental status and fever.  Initially there was concerns of aspiration pneumonia therefore started on IV vancomycin, Maxipime and Flagyl.  CT also showed possible early small bowel obstruction and case was discussed by ED physician with Dr. Dema Severin from general surgery who did not think patient has SBO.  Due to his recurrent ongoing dementia with delirium and readmissions, patient's sister is interested in discussion with palliative care. ?   ? ? ?Assessment & Plan: ?  ?Principal Problem: ?  Sepsis due to pneumonia [facility]) ?Active Problems: ?  Frontotemporal dementia ([place]) ?  Acute on chronic intracranial subdural hematoma (HCC) ?  History of pulmonary embolus (PE) ?  MDD (major depressive disorder) ?  Protein-calorie malnutrition, severe ?  Goals of care, counseling/discussion ? ? ?Assessment and Plan: ?* Sepsis due to pneumonia [facility]) ?CT with findings c/w aspiration pneumonia ?MRSA PCR positive ?Negative covid, influenza ?Urine strep, legionella ?Blood culture NGx5 ?No sputum cx able to be collected to this point ?Currently on vanc/unasyn ? ? ?Frontotemporal dementia ([place]) ?With delirium, now reportedly close to baseline ?CT head with decrease in size of acute on chronic L subdural hematoma (discussed by prior MD with nsgy, ok for anticoagulation prn) ?Delirium precautions ?Prn ativan ?Scheduled risperdal, cogentin, trazodone ? ? ?Acute on chronic intracranial subdural hematoma (HCC) ?Noted ? ?History of  pulmonary embolus (PE) ?Hx PE and right popliteal DVT, Nov 2022 ?No longer on anticoagulation due to fall risk ?LE [name] negative here for DVT ? ?Goals of care, counseling/discussion ?Sister interested in hospice, discussion today with palliative ? ? ? ? ? ? ? ?DVT prophylaxis: SCD ?Code Status: DNR ?Family Communication: none ?Disposition:  ? ?Status is: Inpatient ?Remains inpatient appropriate because: pending goc/placement ?  ?Consultants:  ?Palliative care ? ?Procedures:  ?LE [name] ?Summary:  ?RIGHT:  ?- There is no evidence of deep vein thrombosis in the lower extremity.  ?   ?- No cystic structure found in the popliteal fossa.  ?   ?LEFT:  ?- There is no evidence of deep vein thrombosis in the lower extremity.  ?   ?- No cystic structure found in the popliteal fossa. ? ?Antimicrobials:  ?Anti-infectives (From admission, onward)  ? ? Start     Dose/Rate Route Frequency Ordered Stop  ? 01/21/22 1200  Ampicillin-Sulbactam (UNASYN) 3 g in sodium chloride 0.9 % 100 mL IVPB       ? 3 g ?200 mL/hr over 30 Minutes Intravenous Every 6 hours 01/21/22 1051 01/25/22 2359  ? 01/19/22 1000  vancomycin (VANCOCIN) IVPB 1000 mg/200 mL premix       ? 1,000 mg ?200 mL/hr over 60 Minutes Intravenous Every 12 hours 01/19/22 0152 01/25/22 2359  ? 01/19/22 0800  metroNIDAZOLE (FLAGYL) IVPB 500 mg  Status:  Discontinued       ? 500 mg ?100 mL/hr over 60 Minutes Intravenous Every 12 hours 01/19/22 0158 01/21/22 1050  ? 01/19/22 0400  ceFEPIme (MAXIPIME) 2 g in sodium chloride 0.9 % 100 mL IVPB  Status:  Discontinued       ? 2 g ?200 mL/hr over 30 Minutes Intravenous Every 8 hours 01/18/22 2346 01/21/22 1050  ? 01/18/22 1945  ceFEPIme (MAXIPIME) 2 g in sodium chloride 0.9 % 100 mL IVPB       ? 2 g ?200 mL/hr over 30 Minutes Intravenous  Once 01/18/22 1939 01/18/22 2122  ? 01/18/22 1945  metroNIDAZOLE (FLAGYL) IVPB 500 mg       ? 500 mg ?100 mL/hr over 60 Minutes Intravenous  Once 01/18/22 1939 01/18/22 2122  ? 01/18/22 1945  vancomycin  (VANCOCIN) IVPB 1000 mg/200 mL premix  Status:  Discontinued       ? 1,000 mg ?200 mL/hr over 60 Minutes Intravenous  Once 01/18/22 1939 01/18/22 1942  ? 01/18/22 1945  vancomycin (VANCOREADY) IVPB 1500 mg/300 mL       ? 1,500 mg ?150 mL/hr over 120 Minutes Intravenous  Once 01/18/22 1942 01/19/22 0026  ? ?  ? ? ?Subjective: ?Doesn't follow commands or speak  ? ?Objective: ?Vitals:  ? 01/22/22 1420 01/22/22 2041 01/23/22 0534 01/23/22 0700  ?BP: 122/68 (!) 138/91 129/89   ?Pulse: 62 68 64   ?Resp: 18 18 16    ?Temp: 97.6 ?F (36.4 ?C) (!) 97.5 ?F (36.4 ?C) 97.9 ?F (36.6 ?C)   ?TempSrc: Oral Oral Oral   ?SpO2: 98% 100% 100%   ?Weight:    63.2 kg  ?Height:    6' (1.829 m)  ? ? ?Intake/Output Summary (Last 24 hours) at 01/23/2022 1319 ?Last data filed at 01/23/2022 1241 ?Gross per 24 hour  ?Intake 2709.9 ml  ?Output 1700 ml  ?Net 1009.9 ml  ? ?Filed Weights  ? 01/23/22 0700  ?Weight: 63.2 kg  ? ? ?Examination: ? ?General exam: Appears calm and comfortable  ?Respiratory system: unlabored ?Cardiovascular system: RRR ?Gastrointestinal system: Abdomen is nondistended, soft and nontender. ?Central nervous system: disoriented, doesn't follow commands ?Extremities: no LEE ? ? ?Data Reviewed: I have personally reviewed following labs and imaging studies ? ?CBC: ?Recent Labs  ?Lab 01/18/22 ?2000 01/19/22 ?0007 01/20/22 ?0617 01/21/22 ?[id number] 01/22/22 ?[id number] 01/23/22 ?[id number]  ?WBC 15.4* 12.0* 9.5 7.2 6.3 5.8  ?NEUTROABS 13.4*  --   --   --   --   --   ?HGB 12.3* 10.8* 11.0* 11.0* 12.3* 13.5  ?HCT 36.8* 31.5* 32.9* 34.4* 36.9* 41.0  ?MCV 98.9 99.1 97.9 104.6* 98.7 97.9  ?PLT 212 172 187 143* 227 241  ? ? ?Basic Metabolic Panel: ?Recent Labs  ?Lab 01/18/22 ?2000 01/19/22 ?0007 01/20/22 ?0617 01/21/22 ?[id number] 01/22/22 ?[id number] 01/23/22 ?[id number]  ?NA 139  --  140 139 140 142  ?K 3.5  --  3.0* 3.4* 3.7 3.8  ?CL 107  --  107 109 107 106  ?CO2 28  --  27 25 28 30   ?GLUCOSE 120*  --  117* 84 115* 93  ?BUN 19  --  12 14 16 11   ?CREATININE 0.61 0.62 0.48*  0.57* 0.46* 0.59*  ?CALCIUM 8.4*  --  8.0* 7.9* 8.1* 8.1*  ?MG  --   --  2.1 2.2 2.3 2.2  ? ? ?GFR: ?Estimated Creatinine Clearance: 82.3 mL/min (Sammie Denner) (by C-G formula based on SCr of 0.59 mg/dL (L)). ? ?Liver Function Tests: ?Recent Labs  ?Lab 01/18/22 ?2000  ?AST 15  ?ALT 16  ?ALKPHOS 68  ?BILITOT 0.9  ?PROT 6.2*  ?ALBUMIN 3.3*  ? ? ?CBG: ?Recent Labs  ?Lab 01/22/22 ?2350 01/23/22 ?[id number] 01/23/22 ?[id number] 01/23/22 ?0741 01/23/22 ?  [place] 80 82  ? ? ? ?Recent Results (from the past 240 hour(s))  ?Urine Culture     Status: Abnormal  ? Collection Time: 01/18/22  7:51 PM  ? Specimen: In/Out Cath Urine  ?Result Value Ref Range Status  ? Specimen Description   Final  ?  IN/OUT CATH URINE ?Performed at [facility], [place] [address] [place], [place] [address] ?  ? Special Requests   Final  ?  NONE ?Performed at [facility], [place] [address], [place], [place] [address] ?  ? Culture (Billye Pickerel)  Final  ?  >=100,000 COLONIES/mL METHICILLIN RESISTANT STAPHYLOCOCCUS AUREUS  ? Report Status 01/21/2022 FINAL  Final  ? Organism ID, Bacteria METHICILLIN RESISTANT STAPHYLOCOCCUS AUREUS (Meerab Maselli)  Final  ?    Susceptibility  ? Methicillin resistant staphylococcus aureus - MIC*  ?  CIPROFLOXACIN >=8 RESISTANT Resistant   ?  GENTAMICIN <=0.5 SENSITIVE Sensitive   ?  NITROFURANTOIN <=16 SENSITIVE Sensitive   ?  OXACILLIN >=4 RESISTANT Resistant   ?  TETRACYCLINE <=1 SENSITIVE Sensitive   ?  VANCOMYCIN <=0.5 SENSITIVE Sensitive   ?  TRIMETH/SULFA <=10 SENSITIVE Sensitive   ?  CLINDAMYCIN <=0.25 SENSITIVE Sensitive   ?  RIFAMPIN <=0.5 SENSITIVE Sensitive   ?  Inducible Clindamycin NEGATIVE Sensitive   ?  * >=100,000 COLONIES/mL METHICILLIN RESISTANT STAPHYLOCOCCUS AUREUS  ?Blood Culture (routine x 2)     Status: None  ? Collection Time: 01/18/22  8:00 PM  ? Specimen: BLOOD  ?Result Value Ref Range Status  ? Specimen Description   Final  ?  BLOOD BLOOD RIGHT ARM ?Performed at [facility], [place] [address], [place], [place] [address] ?  ? Special Requests   Final  ?  BOTTLES DRAWN AEROBIC AND ANAEROBIC Blood Culture adequate volume ?Performed at [facility], [address]

## 2022-01-23 NOTE — Progress Notes (Signed)
Hypoglycemic Event ? ?CBG: 60 ? ?Treatment: 8 oz juice/soda ? ?Symptoms: None ? ?Follow-up CBG: Time:0615 CBG Result: 65 mg/dl ? ?Possible Reasons for Event: Inadequate meal intake ? ?Comments/MD notified: orange juice given ? ? ? ?Micheal [name] ? ? ?

## 2022-01-24 DIAGNOSIS — Z86711 Personal history of pulmonary embolism: Secondary | ICD-10-CM | POA: Diagnosis not present

## 2022-01-24 DIAGNOSIS — G3109 Other frontotemporal dementia: Secondary | ICD-10-CM | POA: Diagnosis not present

## 2022-01-24 DIAGNOSIS — J189 Pneumonia, unspecified organism: Secondary | ICD-10-CM | POA: Diagnosis not present

## 2022-01-24 DIAGNOSIS — J690 Pneumonitis due to inhalation of food and vomit: Secondary | ICD-10-CM | POA: Diagnosis not present

## 2022-01-24 DIAGNOSIS — F0280 Dementia in other diseases classified elsewhere without behavioral disturbance: Secondary | ICD-10-CM

## 2022-01-24 LAB — GLUCOSE, CAPILLARY
Glucose-Capillary: 122 mg/dL — ABNORMAL HIGH (ref 70–99)
Glucose-Capillary: 152 mg/dL — ABNORMAL HIGH (ref 70–99)
Glucose-Capillary: 59 mg/dL — ABNORMAL LOW (ref 70–99)
Glucose-Capillary: 73 mg/dL (ref 70–99)
Glucose-Capillary: 75 mg/dL (ref 70–99)
Glucose-Capillary: 82 mg/dL (ref 70–99)

## 2022-01-24 LAB — BASIC METABOLIC PANEL
Anion gap: 6 (ref 5–15)
BUN: 12 mg/dL (ref 8–23)
CO2: 27 mmol/L (ref 22–32)
Calcium: 8 mg/dL — ABNORMAL LOW (ref 8.9–10.3)
Chloride: 109 mmol/L (ref 98–111)
Creatinine, Ser: 0.62 mg/dL (ref 0.61–1.24)
GFR, Estimated: >60 mL/min (ref >60)
Glucose, Bld: 79 mg/dL (ref 70–99)
Potassium: 4.5 mmol/L (ref 3.5–5.1)
Sodium: 142 mmol/L (ref 135–145)

## 2022-01-24 LAB — CBC
HCT: 29 % — ABNORMAL LOW (ref 39.0–52.0)
Hemoglobin: 9.7 g/dL — ABNORMAL LOW (ref 13.0–17.0)
MCH: 32.3 pg (ref 26.0–34.0)
MCHC: 33.4 g/dL (ref 30.0–36.0)
MCV: 96.7 fL (ref 80.0–100.0)
Platelets: 273 10*3/uL (ref 150–400)
RBC: 3 MIL/uL — ABNORMAL LOW (ref 4.22–5.81)
RDW: 12.7 % (ref 11.5–15.5)
WBC: 4.9 10*3/uL (ref 4.0–10.5)
nRBC: 0 % (ref 0.0–0.2)

## 2022-01-24 LAB — MAGNESIUM: Magnesium: 2.2 mg/dL (ref 1.7–2.4)

## 2022-01-24 MED ORDER — BOOST / RESOURCE BREEZE PO LIQD CUSTOM
1.0000 | Freq: Two times a day (BID) | ORAL | Status: DC
  Administered 2022-01-24 – 2022-01-30 (×13): 1 via ORAL

## 2022-01-24 MED ORDER — ENSURE ENLIVE PO LIQD
237.0000 mL | Freq: Two times a day (BID) | ORAL | Status: DC
  Administered 2022-01-24 – 2022-01-30 (×13): 237 mL via ORAL

## 2022-01-24 NOTE — Progress Notes (Signed)
Nutrition Follow-up ? ?DOCUMENTATION CODES:  ? ?Severe malnutrition in context of chronic illness ? ?INTERVENTION:  ? ?-Ensure Plus High Protein po BID, each supplement provides 350 kcal and 20 grams of protein.  ? ?-Boost Breeze po BID, each supplement provides 250 kcal and 9 grams of protein  ? ?-Multivitamin with minerals daily ? ?NUTRITION DIAGNOSIS:  ? ?Severe Malnutrition related to chronic illness (dementia) as evidenced by severe fat depletion, severe muscle depletion, percent weight loss. ? ?Ongoing. ? ?GOAL:  ? ?Patient will meet greater than or equal to 90% of their needs ? ?Progressing. ? ?MONITOR:  ? ?PO intake, Supplement acceptance, Labs, Weight trends, I & O's ? ?REASON FOR ASSESSMENT:  ? ?Consult ?Assessment of nutrition requirement/status ? ?ASSESSMENT:  ? ?66 year old with history of HTN, major depressive disorder, nonverbal at baseline but usually ambulates brought to the hospital from [facility] facility for change in mental status and fever. ? ?Pt continues to be disoriented. ?Patient consumed 10-100% of meals yesterday per documentation. Pt did not eat any breakfast but taking liquids. Was provided Ensure this morning as well. Added given fluctuating intakes.  ? ?Weight today: 139 lbs. ?Pt has lost 20 lbs since 12/02/21 (12% wt loss x 2 months, significant for time frame). ? ?Medications: Multivitamin with minerals daily, D5 infusion ? ?Labs reviewed: ? CBGs: 64-152 ? ?Diet Order:   ?Diet Order   ? ?       ?  DIET DYS 3 Room service appropriate? No; Fluid consistency: Thin  Diet effective now       ?  ? ?  ?  ? ?  ? ? ?EDUCATION NEEDS:  ? ?No education needs have been identified at this time ? ?Skin:  Skin Assessment: Reviewed RN Assessment ? ?Last BM:  PTA ? ?Height:  ? ?Ht Readings from Last 1 Encounters:  ?01/23/22 6' (1.829 m)  ? ? ?Weight:  ? ?Wt Readings from Last 1 Encounters:  ?01/23/22 63.2 kg  ? ? ?BMI:  Body mass index is 18.9 kg/m?. ? ?Estimated Nutritional Needs:  ? ?Kcal:   2000-2200 ? ?Protein:  105-115g ? ?Fluid:  2L/day ? ?Tilda Franco, MS, RD, LDN ?Inpatient Clinical Dietitian ?Contact information available via Amion ? ?

## 2022-01-24 NOTE — Progress Notes (Signed)
Date and time results received: 01/24/22 1842 ?(use smartphrase ".now" to insert current time) ? ?Test: CBG ?Critical Value: 59 ? ?Name of Provider Notified: lama ? ?Orders Received? Or Actions Taken?: hypoglycemic protocol followed ?

## 2022-01-24 NOTE — Progress Notes (Signed)
[facility] ? ?PROGRESS NOTE ? ?Micheal [name] KDX:[id number] DOB: [birth year] DOA: 01/18/2022 ?PCP: No primary care provider on file. ? ? ?Brief HPI:   ?66 year old male with a history of hypertension, major depressive disorder, nonverbal at baseline, usually ambulates was brought to hospital from [facility] facility for change in mental status and fever.  Initially was concern for aspiration pneumonia and he was started on IV vancomycin, Maxipime and Flagyl.  CT abdomen also showed possible early small bowel obstruction, ED provider discussed with general surgeon Dr. Cliffton Asters who did not think that patient had SBO.  Due to recurrent ongoing dementia with delirium and readmissions sister wanted palliative care discussion.  Palliative care was consulted. ? ? ? ?Subjective  ? ?Patient seen, is somnolent but arousable.  No new complaints. ? ? Assessment/Plan:  ? ? ? ?Sepsis due to pneumonia/UTI ?-Urine culture grew MRSA ?-Patient started on vancomycin, Maxipime and Flagyl ?-Antibiotics have been changed to vancomycin and Unasyn ?-Sepsis physiology has resolved ?-Blood cultures x2 showed no growth in 5 days ? ?Frontotemporal dementia ?-Patient now has delirium ?-Slowly improving ?-CT head showed decrease in size of acute on chronic left subdural hematoma ?-Discussed with primary MD with neurosurgery who said okay with anticoagulation as needed ?-Continue scheduled Risperdal, Cogentin, trazodone ?-Continue as needed Ativan ? ?Hypoglycemia ?-Secondary to poor p.o. intake ?-Continue D5 LR at 50 mL/h ? ?Normocytic anemia ?-Hemoglobin down to 9.7 today ?-Repeat CBC in a.m., if still low consider anemia panel and FOBT ? ?Acute on chronic intracranial subdural hematoma ?-This was diagnosed on 01/07/2022 where patient presented to the ED with fall.  At that time neurosurgery recommended discharging patient back to the facility as there was no acute intervention necessary. ? ?History of acute PE with right popliteal DVT in  November 2022 ?-He has been taken off anticoagulation due to risk of falling ?-Lower extremity Dopplers were negative on 01/19/2022 ? ?Goals of care discussion ?-Goals of care discussion with palliative care, plan for outpatient palliative care follow-up ?-Patient is DNR ? ?Medications ? ?  ? benztropine  1 mg Oral BID  ? Chlorhexidine Gluconate Cloth  6 each Topical Q0600  ? dextrose  25 mL Intravenous Once  ? feeding supplement  1 Container Oral BID WC  ? feeding supplement  237 mL Oral BID BM  ? guaiFENesin  600 mg Oral BID  ? mouth rinse  15 mL Mouth Rinse BID  ? multivitamin with minerals  1 tablet Oral Daily  ? mupirocin ointment  1 application. Nasal BID  ? risperiDONE  0.5 mg Oral QHS  ? ? ? Data Reviewed:  ? ?CBG: ? ?Recent Labs  ?Lab 01/23/22 ?1840 01/23/22 ?1932 01/24/22 ?9767 01/24/22 ?0602 01/24/22 ?1206  ?GLUCAP 64* 78 152* 73 75  ? ? ?SpO2: 98 %  ? ? ?Vitals:  ? 01/23/22 0700 01/23/22 1432 01/23/22 2021 01/24/22 1454  ?BP:  121/83 129/87 108/66  ?Pulse:  70 72 85  ?Resp:  18 16 18   ?Temp:  (!) 97.5 ?F (36.4 ?C) (!) 97.4 ?F (36.3 ?C) (!) 97.5 ?F (36.4 ?C)  ?TempSrc:  Oral Oral Oral  ?SpO2:  98% 96% 98%  ?Weight: 63.2 kg     ?Height: 6' (1.829 m)     ? ? ? ? ?Data Reviewed: ? ?Basic Metabolic Panel: ?Recent Labs  ?Lab 01/20/22 ?0617 01/21/22 ?01/23/22 01/22/22 ?01/24/22 01/23/22 ?01/25/22 01/24/22 ?01/26/22  ?NA 140 139 140 142 142  ?K 3.0* 3.4* 3.7 3.8 4.5  ?CL 107 109 107  106 109  ?CO2 27 25 28 30 27   ?GLUCOSE 117* 84 115* 93 79  ?BUN 12 14 16 11 12   ?CREATININE 0.48* 0.57* 0.46* 0.59* 0.62  ?CALCIUM 8.0* 7.9* 8.1* 8.1* 8.0*  ?MG 2.1 2.2 2.3 2.2 2.2  ? ? ?CBC: ?Recent Labs  ?Lab 01/18/22 ?2000 01/19/22 ?0007 01/20/22 ?0617 01/21/22 ?01/22/22 01/22/22 ?3545 01/23/22 ?6256 01/24/22 ?3893  ?WBC 15.4*   < > 9.5 7.2 6.3 5.8 4.9  ?NEUTROABS 13.4*  --   --   --   --   --   --   ?HGB 12.3*   < > 11.0* 11.0* 12.3* 13.5 9.7*  ?HCT 36.8*   < > 32.9* 34.4* 36.9* 41.0 29.0*  ?MCV 98.9   < > 97.9 104.6* 98.7 97.9 96.7  ?PLT 212   < >  187 143* 227 241 273  ? < > = values in this interval not displayed.  ? ? ?LFT ?Recent Labs  ?Lab 01/18/22 ?2000  ?AST 15  ?ALT 16  ?ALKPHOS 68  ?BILITOT 0.9  ?PROT 6.2*  ?ALBUMIN 3.3*  ? ?  ?Antibiotics: ?Anti-infectives (From admission, onward)  ? ? Start     Dose/Rate Route Frequency Ordered Stop  ? 01/21/22 1200  Ampicillin-Sulbactam (UNASYN) 3 g in sodium chloride 0.9 % 100 mL IVPB       ? 3 g ?200 mL/hr over 30 Minutes Intravenous Every 6 hours 01/21/22 1051 01/25/22 2359  ? 01/19/22 1000  vancomycin (VANCOCIN) IVPB 1000 mg/200 mL premix       ? 1,000 mg ?200 mL/hr over 60 Minutes Intravenous Every 12 hours 01/19/22 0152 01/25/22 2359  ? 01/19/22 0800  metroNIDAZOLE (FLAGYL) IVPB 500 mg  Status:  Discontinued       ? 500 mg ?100 mL/hr over 60 Minutes Intravenous Every 12 hours 01/19/22 0158 01/21/22 1050  ? 01/19/22 0400  ceFEPIme (MAXIPIME) 2 g in sodium chloride 0.9 % 100 mL IVPB  Status:  Discontinued       ? 2 g ?200 mL/hr over 30 Minutes Intravenous Every 8 hours 01/18/22 2346 01/21/22 1050  ? 01/18/22 1945  ceFEPIme (MAXIPIME) 2 g in sodium chloride 0.9 % 100 mL IVPB       ? 2 g ?200 mL/hr over 30 Minutes Intravenous  Once 01/18/22 1939 01/18/22 2122  ? 01/18/22 1945  metroNIDAZOLE (FLAGYL) IVPB 500 mg       ? 500 mg ?100 mL/hr over 60 Minutes Intravenous  Once 01/18/22 1939 01/18/22 2122  ? 01/18/22 1945  vancomycin (VANCOCIN) IVPB 1000 mg/200 mL premix  Status:  Discontinued       ? 1,000 mg ?200 mL/hr over 60 Minutes Intravenous  Once 01/18/22 1939 01/18/22 1942  ? 01/18/22 1945  vancomycin (VANCOREADY) IVPB 1500 mg/300 mL       ? 1,500 mg ?150 mL/hr over 120 Minutes Intravenous  Once 01/18/22 1942 01/19/22 0026  ? ?  ? ? ? ?DVT prophylaxis: SCDs ? ?Code Status: DNR ? ?Family Communication: No family at bedside ? ? ?CONSULTS palliative care ? ? ?Objective  ? ? ?Physical Examination: ? ? ?General-appears in no acute distress ?Heart-S1-S2, regular, no murmur auscultated ?Lungs-clear to auscultation  bilaterally, no wheezing or crackles auscultated ?Abdomen-soft, nontender, no organomegaly ?Extremities-no edema in the lower extremities ?Neuro-somnolent but arousable ? ? ?Status is: Inpatient: Altered mental status ? ? ? ?  ? ?01/20/22 ?  ?[facility] ?If 7PM-7AM, please contact night-coverage at [email or web address], ?Office  [phone] ? ? ?  01/24/2022, 3:39 PM  LOS: 6 days  ? ? ? ? ? ? ? ? ? ? ?  ?

## 2022-01-24 NOTE — Progress Notes (Signed)
? ?                                                                                                                                                     ?                                                   ?Daily Progress Note  ? ?Patient Name: Micheal [name]       Date: 01/24/2022 ?DOB: 1956-09-24  Age: 66 y.o. MRN#: [id number] ?Attending Physician: Oswald Hillock, MD ?Primary Care Physician: No primary care provider on file. ?Admit Date: 01/18/2022 ? ?Reason for Consultation/Follow-up: Establishing goals of care ? ?Subjective: ?I saw and examined Micheal [name] today.  He remains minimally interactive but with no signs of distress. ? ?I met today with his sister, Jackelyn Poling, in conjunction with Alinda Sierras from transition of care team.  Jackelyn Poling called to have Sarina Ser who is his representative from guardian service who is managing his financial guardianship part of conversation as well. ? ?We discussed that, unfortunately, due to his ability to retain information and follow direction Micheal [name] is not a candidate for acute rehab. ? ?We also discussed that working to secure him new long-term care facility would need to be done as an outpatient in the hospital would be sending him back to [facility] as this is his long-term home at this point and he is able to return there. ? ?Length of Stay: 6 ? ?Current Medications: ?Scheduled Meds:  ? benztropine  1 mg Oral BID  ? Chlorhexidine Gluconate Cloth  6 each Topical Q0600  ? dextrose  25 mL Intravenous Once  ? feeding supplement  1 Container Oral TID BM  ? guaiFENesin  600 mg Oral BID  ? mouth rinse  15 mL Mouth Rinse BID  ? multivitamin with minerals  1 tablet Oral Daily  ? mupirocin ointment  1 application. Nasal BID  ? risperiDONE  0.5 mg Oral QHS  ? ? ?Continuous Infusions: ? ampicillin-sulbactam (UNASYN) IV 3 g (01/24/22 0603)  ? dextrose 5% lactated ringers 50 mL/hr at 01/23/22 0800  ? vancomycin 1,000 mg (01/23/22 2222)  ? ? ?PRN Meds: ?acetaminophen **OR** acetaminophen,  dextromethorphan-guaiFENesin, glucagon (human recombinant), hydrALAZINE, HYDROcodone-acetaminophen, ipratropium-albuterol, LORazepam, morphine injection, ondansetron **OR** ondansetron (ZOFRAN) IV, senna-docusate, traZODone ? ?Physical Exam         ?General: Alert, awake, in no acute distress.  Minimal interaction. ?HEENT: No bruits, no goiter, no JVD ?Heart: Regular rate and rhythm. No murmur appreciated. ?Lungs: Good air movement, clear ?Abdomen: Soft, nontender, nondistended, positive bowel sounds.   ?Ext: No significant edema ? ?Vital Signs: BP 129/87 (BP Location: Left Arm)   Pulse  72   Temp (!) 97.4 ?F (36.3 ?C) (Oral)   Resp 16   Ht 6' (1.829 m)   Wt 63.2 kg   SpO2 96%   BMI 18.90 kg/m?  ?SpO2: SpO2: 96 % ?O2 Device: O2 Device: Room Air ?O2 Flow Rate:   ? ?Intake/output summary:  ?Intake/Output Summary (Last 24 hours) at 01/24/2022 0838 ?Last data filed at 01/24/2022 1610 ?Gross per 24 hour  ?Intake 1388.84 ml  ?Output 2300 ml  ?Net -911.16 ml  ? ?LBM: Last BM Date :  (PTA) ?Baseline Weight: Weight: 63.2 kg ?Most recent weight: Weight: 63.2 kg ? ?     ?Palliative Assessment/Data: ? ? ? ?Flowsheet Rows   ? ?Flowsheet Row Most Recent Value  ?Intake Tab   ?Referral Department Hospitalist  ?Unit at Time of Referral Med/Surg Unit  ?Palliative Care Primary Diagnosis Neurology  ?Date Notified 01/21/22  ?Palliative Care Type New Palliative care  ?Reason for referral Clarify Goals of Care  ?Date of Admission 01/18/22  ?Date first seen by Palliative Care 01/22/22  ?# of days Palliative referral response time 1 Day(s)  ?# of days IP prior to Palliative referral 3  ?Clinical Assessment   ?Palliative Performance Scale Score 50%  ?Psychosocial & Spiritual Assessment   ?Palliative Care Outcomes   ?Patient/Family meeting held? Yes  ?Who was at the meeting? Sister  ?Palliative Care Outcomes Counseled regarding hospice  ? ?  ? ? ?Patient Active Problem List  ? Diagnosis Date Noted  ? Frontotemporal dementia with  behavioral disturbance ([place]) 03/01/2020  ? Dehydration with hypernatremia 03/01/2020  ? Essential hypertension 03/01/2020  ? Sepsis due to pneumonia ([place]) 01/18/2022  ? Frontotemporal dementia ([place]) 01/23/2022  ? Acute on chronic intracranial subdural hematoma (HCC) 01/23/2022  ? History of pulmonary embolus (PE) 01/23/2022  ? MDD (major depressive disorder) 01/23/2022  ? Protein-calorie malnutrition, severe 01/21/2022  ? Goals of care, counseling/discussion 01/23/2022  ? Unstable gait 08/06/2021  ? Weakness   ? ? ?Palliative Care Assessment & Plan  ? ?Patient Profile: ?66 y.o. male  with past medical history of hypertension, depressive disorder, documented to be nonverbal at baseline, long-term resident of [place] admitted on 01/18/2022 with concern for aspiration pneumonia and concern for bowel obstruction.  Surgery discussed with ED physician and feel not likely this is SBO.  Recommendation from primary hospitalist to his sister has been for hospice at time of discharge.  Palliative consulted for goals of care. ? ?Recommendations/Plan: ?DNR/DNI ?Plan for transition back to [facility] with outpatient palliative to follow when he is medically ready for discharge unless family is able to find another facility who is able to accept him at time he is medically ready for discharge (likely tomorrow). ?His sister, Jackelyn Poling, is his guardian for healthcare matters. ?[employer] is his guardian for financial matters.  Jacqulyn Cane was part of our conversation today and she is going to send paperwork outlining their legal authority for his financial matters.  Patient's sister, Jackelyn Poling, would like to figure out what needs to be done to allow Jarrett Soho or other representatives from [employer] of Guardianship to be able to obtain any healthcare information regarding Micheal [name] as they have been helping her process what is best for him for his care long-term.  Today, she gave verbal consent to  speak with anybody from this agency and she would like to fill out any paperwork if necessary to give the hospital or any care team members permission to share information with  them regarding Mr. Maultsby and his health. ?His sister expressed concern about his recurrent falls and she feels that these happen frequently after he receives Haldol which he gets is a monthly injectable medication.  I would agree that this is something that needs to be reevaluated in regards to the risk and benefit.  She is planning to readdress with the outpatient provider who has been prescribing this for him.  We discussed agitation and behaviors and utilization of behavior modification as well as medications as needed.  Question if better alternative to Haldol is available such as Risperdal as it has labeled indication for agitation dementia and does come in an LAI form.  In addition, as an atypical antipsychotic this may also allow him to stop Cogentin which could also be contributing to his delirium/falls. ?We have discussed hospice services and, while that is not the current care plan for him, his sister understands better regarding natural trajectory of dementia and the fact that this may be the tipping point where he begins to have recurrent aspiration events.  As noted, recommendation for an outpatient palliative care to continue to follow. ? ?Code Status: ? ?  ?Code Status Orders  ?(From admission, onward)  ?  ? ? ?  ? ?  Start     Ordered  ? 01/19/22 0842  Do not attempt resuscitation (DNR)  Continuous       ?Question Answer Comment  ?In the event of cardiac or respiratory ARREST Do not call a ?code blue?   ?In the event of cardiac or respiratory ARREST Do not perform Intubation, CPR, defibrillation or ACLS   ?In the event of cardiac or respiratory ARREST Use medication by any route, position, wound care, and other measures to relive pain and suffering. May use oxygen, suction and manual treatment of airway obstruction as needed  for comfort.   ?  ? 01/19/22 0841  ? ?  ?  ? ?  ? ?Code Status History   ? ? Date Active Date Inactive Code Status Order ID Comments User Context  ? 01/18/2022 2346 01/19/2022 0841 Full Code [id number]  Hugelmeyer, Ubaldo Glassing,

## 2022-01-24 NOTE — [place] FL2 (Addendum)
?[place] MEDICAID FL2 LEVEL OF CARE SCREENING TOOL  ?  ? ?IDENTIFICATION  ?Patient Name: ?Kacy Conely Birthdate: [birth year] Sex: male Admission Date (Current Location): ?01/18/2022  ?[place] Number: ? [place] ?  Facility and Address:  ?[facility],  [address], [place] [address] ?     Provider Number: ?[id number]  ?Attending Physician Name and Address:  ?Meredeth Ide, MD ? Relative Name and Phone Number:  ?  ?   ?Current Level of Care: ?Hospital Recommended Level of Care: ?Memory Care (Secured unit) Prior Approval Number: ?  ? ?Date Approved/Denied: ?  PASRR Number: ?  ? ?Discharge Plan: ?Other (Comment) (Memory Care/Secured unit) ?  ? ?Current Diagnoses: ?Patient Active Problem List  ? Diagnosis Date Noted  ? Frontotemporal dementia (HCC) 01/23/2022  ? Acute on chronic intracranial subdural hematoma (HCC) 01/23/2022  ? History of pulmonary embolus (PE) 01/23/2022  ? MDD (major depressive disorder) 01/23/2022  ? Goals of care, counseling/discussion 01/23/2022  ? Protein-calorie malnutrition, severe 01/21/2022  ? Sepsis due to pneumonia (HCC) 01/18/2022  ? Unstable gait 08/06/2021  ? Weakness   ? Frontotemporal dementia with behavioral disturbance (HCC) 03/01/2020  ? Dehydration with hypernatremia 03/01/2020  ? Essential hypertension 03/01/2020  ? ? ?Orientation RESPIRATION BLADDER Height & Weight   ?  ?  ? Normal Incontinent Weight: 63.2 kg ?Height:  6' (182.9 cm)  ?BEHAVIORAL SYMPTOMS/MOOD NEUROLOGICAL BOWEL NUTRITION STATUS  ?    Incontinent Diet (Regular diet/Finger foods/ Double portion)  ?AMBULATORY STATUS COMMUNICATION OF NEEDS Skin   ?Limited Assist Does not communicate Other (Comment) (Redness to head and sacrum) ?  ?  ?  ?    ?     ?     ? ? ?Personal Care Assistance Level of Assistance  ?Bathing, Feeding, Dressing Bathing Assistance: Limited assistance ?Feeding assistance: Limited assistance ?Dressing Assistance: Limited assistance ?   ? ?Functional Limitations Info  ?     ?  ?   ? ? ?SPECIAL CARE FACTORS FREQUENCY  ?    ?  ?  ?  ?  ?  ?  ?   ? ? ?Contractures    ? ? ?Additional Factors Info  ?Code Status, Allergies Code Status Info: DNR ?Allergies Info: No known allergies ?  ?  ?  ?   ? ?Current Medications (01/24/2022):  This is the current hospital active medication list ?Current Facility-Administered Medications  ?Medication Dose Route Frequency Provider Last Rate Last Admin  ? acetaminophen (TYLENOL) tablet 650 mg  650 mg Oral Q6H PRN Hugelmeyer, Alexis, DO      ? Or  ? acetaminophen (TYLENOL) suppository 650 mg  650 mg Rectal Q6H PRN Hugelmeyer, Alexis, DO      ? Ampicillin-Sulbactam (UNASYN) 3 g in sodium chloride 0.9 % 100 mL IVPB  3 g Intravenous Q6H Amin, Ankit Chirag, MD 200 mL/hr at 01/24/22 0603 3 g at 01/24/22 0603  ? benztropine (COGENTIN) tablet 1 mg  1 mg Oral BID Dimple Nanas, MD   1 mg at 01/24/22 0936  ? Chlorhexidine Gluconate Cloth 2 % PADS 6 each  6 each Topical Q0600 Dimple Nanas, MD   6 each at 01/24/22 0604  ? dextromethorphan-guaiFENesin (MUCINEX DM) 30-600 MG per 12 hr tablet 1 tablet  1 tablet Oral BID PRN Amin, Ankit Chirag, MD      ? dextrose 5 % in lactated ringers infusion   Intravenous Continuous Zigmund Daniel., MD 50 mL/hr at 01/23/22 0800  New Bag at 01/23/22 0800  ? dextrose 50 % solution 25 mL  25 mL Intravenous Once Luiz Iron, NP      ? feeding supplement (BOOST / RESOURCE BREEZE) liquid 1 Container  1 Container Oral TID BM Dimple Nanas, MD   1 Container at 01/23/22 2110  ? glucagon (human recombinant) (GLUCAGEN) injection 1 mg  1 mg Intravenous Once PRN Amin, Loura Halt, MD      ? guaiFENesin (MUCINEX) 12 hr tablet 600 mg  600 mg Oral BID Hugelmeyer, Alexis, DO   600 mg at 01/24/22 0935  ? hydrALAZINE (APRESOLINE) injection 10 mg  10 mg Intravenous Q4H PRN Amin, Ankit Chirag, MD      ? HYDROcodone-acetaminophen (NORCO/VICODIN) 5-325 MG per tablet 1-2 tablet  1-2 tablet Oral Q4H PRN Hugelmeyer, Alexis, DO      ?  ipratropium-albuterol (DUONEB) 0.5-2.5 (3) MG/3ML nebulizer solution 3 mL  3 mL Nebulization Q4H PRN Amin, Ankit Chirag, MD      ? LORazepam (ATIVAN) injection 0.5 mg  0.5 mg Intravenous Q4H PRN Amin, Ankit Chirag, MD      ? MEDLINE mouth rinse  15 mL Mouth Rinse BID Amin, Ankit Chirag, MD   15 mL at 01/23/22 2224  ? morphine (PF) 2 MG/ML injection 1 mg  1 mg Intravenous Q4H PRN Hugelmeyer, Alexis, DO      ? multivitamin with minerals tablet 1 tablet  1 tablet Oral Daily Amin, Loura Halt, MD   1 tablet at 01/24/22 0935  ? mupirocin ointment (BACTROBAN) 2 % 1 application.  1 application. Nasal BID Dimple Nanas, MD   1 application. at 01/23/22 2223  ? ondansetron (ZOFRAN) tablet 4 mg  4 mg Oral Q6H PRN Hugelmeyer, Alexis, DO      ? Or  ? ondansetron (ZOFRAN) injection 4 mg  4 mg Intravenous Q6H PRN Hugelmeyer, Alexis, DO      ? risperiDONE (RISPERDAL) tablet 0.5 mg  0.5 mg Oral QHS Amin, Ankit Chirag, MD   0.5 mg at 01/23/22 2111  ? senna-docusate (Senokot-S) tablet 1 tablet  1 tablet Oral QHS PRN Amin, Ankit Chirag, MD      ? traZODone (DESYREL) tablet 50 mg  50 mg Oral QHS PRN Dimple Nanas, MD   50 mg at 01/21/22 2054  ? vancomycin (VANCOCIN) IVPB 1000 mg/200 mL premix  1,000 mg Intravenous Q12H Amin, Ankit Chirag, MD 200 mL/hr at 01/24/22 0937 1,000 mg at 01/24/22 8850  ? ? ? ?Discharge Medications: ?Please see discharge summary for a list of discharge medications. ? ?Relevant Imaging Results: ? ?Relevant Lab Results: ? ? ?Additional Information ?SS# [id number] Pfizer covid vaccination x 2. ? ?Adelfo Diebel, Meriam Sprague, RN ? ? ? ? ?

## 2022-01-24 NOTE — Progress Notes (Signed)
Speech Language Pathology Treatment: Dysphagia  ?Patient Details ?Name: Micheal [name] ?MRN: [id number] ?DOB: [birth year] ?Today's Date: 01/24/2022 ?Time: 1250-1310 ?SLP Time Calculation (min) (ACUTE ONLY): 20 min ? ?Assessment / Plan / Recommendation ?Clinical Impression ? Micheal [name] seen to assure po tolerance given he is on a modified diet and h/o aspiration pna. Micheal [name] will awaken with maximal stimulation *including RN leaning Micheal [name] forward*  Micheal [name] awoke stating he wanted "water". SLP provided him with water via straw, entire container of Ensure, ice cream and 4 graham crackers.  Cough x1 with Ensure via sequential swallows from straw, otherwise no indication of aspiration c/b coughing, etc. Micheal [name] does have occasional delay with last swallow of sequence - needing extra time before providing more boluses.  SLP attempted to temporarily remove mitts to help Micheal [name] feed self without avail - he consistently clenched his fist with all efforts. He does however put hand toward mouth appearing to assist to self feed.  SLP recommends to continue current diet = feeding Micheal [name] only when fully alert.   When he awakens, his swallow function is adequate - however if fed lethargic - aspiration risk is severe.  No SLP follw up at this time. Posted sign in room re: his inconsisent delay in swallow as well as need for adequate mentation. Thanks. . ?  ?HPI HPI: Micheal [name] is a 66 y.o. male brought to the hospital from [facility] facility for change in mental status and fever. Initially there was concerns for aspiration PNA. CXR (4/14) revealed "Patchy right basilar airspace disease, which may reflect aspiration or infection". CT head (4/14) negative for acute infarct. Previous BSE (08/07/21) revealed clinical indications of mild oral dysphagia. Dys 3/thin liquid diet recommended at that time. PMH: HTN, major depressive disorder, Frontotemporal lobe dementia, nonverbal at baseline but usually ambulates. ?  ?   ?SLP Plan ? All goals met ? ?  ?  ?Recommendations for  follow up therapy are one component of a multi-disciplinary discharge planning process, led by the attending physician.  Recommendations may be updated based on patient status, additional functional criteria and insurance authorization. ?  ? ?Recommendations  ?Diet recommendations: Dysphagia 3 (mechanical soft);Thin liquid ?Liquids provided via: Cup;Straw ?Medication Administration: Crushed with puree ?Compensations: Minimize environmental distractions;Slow rate;Small sips/bites  ?   ?    ?   ? ? ? ? Oral Care Recommendations: Oral care BID ?Follow Up Recommendations: Other (comment) (TBD) ?Assistance recommended at discharge: Frequent or constant Supervision/Assistance ?SLP Visit Diagnosis: Dysphagia, unspecified (R13.10) ?Plan: All goals met ? ? ? ? ?  ?  ?Kathleen Lime, MS CCC SLP ?Acute Rehab Services ?Office [phone] ?Pager [phone] ? ? ?Macario Golds ? ?01/24/2022, 3:53 PM ?

## 2022-01-25 ENCOUNTER — Inpatient Hospital Stay (HOSPITAL_COMMUNITY): Payer: Medicare Other

## 2022-01-25 DIAGNOSIS — F0280 Dementia in other diseases classified elsewhere without behavioral disturbance: Secondary | ICD-10-CM | POA: Diagnosis not present

## 2022-01-25 DIAGNOSIS — R7881 Bacteremia: Secondary | ICD-10-CM | POA: Diagnosis not present

## 2022-01-25 DIAGNOSIS — R4182 Altered mental status, unspecified: Secondary | ICD-10-CM | POA: Diagnosis not present

## 2022-01-25 DIAGNOSIS — Z7189 Other specified counseling: Secondary | ICD-10-CM | POA: Diagnosis not present

## 2022-01-25 DIAGNOSIS — N390 Urinary tract infection, site not specified: Secondary | ICD-10-CM

## 2022-01-25 DIAGNOSIS — J189 Pneumonia, unspecified organism: Secondary | ICD-10-CM | POA: Diagnosis not present

## 2022-01-25 DIAGNOSIS — E162 Hypoglycemia, unspecified: Secondary | ICD-10-CM

## 2022-01-25 DIAGNOSIS — G3109 Other frontotemporal dementia: Secondary | ICD-10-CM | POA: Diagnosis not present

## 2022-01-25 LAB — CBC
HCT: 37 % — ABNORMAL LOW (ref 39.0–52.0)
Hemoglobin: 12.2 g/dL — ABNORMAL LOW (ref 13.0–17.0)
MCH: 32.6 pg (ref 26.0–34.0)
MCHC: 33 g/dL (ref 30.0–36.0)
MCV: 98.9 fL (ref 80.0–100.0)
Platelets: 237 10*3/uL (ref 150–400)
RBC: 3.74 MIL/uL — ABNORMAL LOW (ref 4.22–5.81)
RDW: 12.7 % (ref 11.5–15.5)
WBC: 6.4 10*3/uL (ref 4.0–10.5)
nRBC: 0 % (ref 0.0–0.2)

## 2022-01-25 LAB — ECHOCARDIOGRAM COMPLETE
AR max vel: 2.79 cm2
AV Peak grad: 3.1 mmHg
Ao pk vel: 0.88 m/s
Area-P 1/2: 3.27 cm2
Height: 72 in
S' Lateral: 4 cm
Weight: 2229.29 oz

## 2022-01-25 LAB — GLUCOSE, CAPILLARY
Glucose-Capillary: 101 mg/dL — ABNORMAL HIGH (ref 70–99)
Glucose-Capillary: 120 mg/dL — ABNORMAL HIGH (ref 70–99)
Glucose-Capillary: 77 mg/dL (ref 70–99)
Glucose-Capillary: 98 mg/dL (ref 70–99)

## 2022-01-25 LAB — BASIC METABOLIC PANEL
Anion gap: 4 — ABNORMAL LOW (ref 5–15)
BUN: 11 mg/dL (ref 8–23)
CO2: 30 mmol/L (ref 22–32)
Calcium: 8.2 mg/dL — ABNORMAL LOW (ref 8.9–10.3)
Chloride: 109 mmol/L (ref 98–111)
Creatinine, Ser: 0.61 mg/dL (ref 0.61–1.24)
GFR, Estimated: >60 mL/min (ref >60)
Glucose, Bld: 98 mg/dL (ref 70–99)
Potassium: 4.2 mmol/L (ref 3.5–5.1)
Sodium: 143 mmol/L (ref 135–145)

## 2022-01-25 LAB — HEMOGLOBIN A1C
Hgb A1c MFr Bld: 6.2 % — ABNORMAL HIGH (ref 4.8–5.6)
Mean Plasma Glucose: 131.24 mg/dL

## 2022-01-25 LAB — MAGNESIUM: Magnesium: 2.4 mg/dL (ref 1.7–2.4)

## 2022-01-25 MED ORDER — LINEZOLID 600 MG PO TABS: 600.0000 mg | ORAL_TABLET | Freq: Two times a day (BID) | ORAL | 0 refills | Status: DC

## 2022-01-25 NOTE — TOC Progression Note (Addendum)
Transition of Care (TOC) - Progression Note  ? ? ?Patient Details  ?Name: Micheal [name] ?MRN: [id number] ?Date of Birth: 08-11-1956 ? ?Transition of Care (TOC) CM/SW Contact  ?Tiger Spieker, Marjie Skiff, RN ?Phone Number: ?01/25/2022, 11:17 AM ? ?Clinical Narrative:    ?Spoke with Crystal from [facility] to inquire about weekend return. Crystal states that they can take him back tomorrow if she receives a med list and an FL2 today to look at. The dc summary can be sent with him tomorrow. Will alert MD. ? ?1500 Addendum: FL2 (unsigned), DC summary and PT note from today faxed to [place] at [facility] [phone]. Crystal to look at documentation and call me back when received and looked at. Will need signed FL2 and addendum to summary sent with pt to facility tomorrow.   ? ? ?Readmission Risk Interventions ?   ? View : No data to display.  ?  ?  ?  ? ? ?

## 2022-01-25 NOTE — Assessment & Plan Note (Addendum)
Improved, follow on AM BMP's ?

## 2022-01-25 NOTE — Discharge Summary (Signed)
Physician Discharge Summary  ?Micheal [name] VEL:[id number] DOB: 01/25/1956 DOA: 01/18/2022 ? ?PCP: No primary care provider on file. ? ?Admit date: 01/18/2022 ?Discharge date: 01/25/2022 ? ?Time spent: 40 minutes ? ?Recommendations for Outpatient Follow-up:  ?Follow outpatient CBC/CMP  ?recommended discussion long term regarding appropriateness and whether another med like risperdal (which would allow d/c cogentin) may be better option - considering risk of falls. ?Discharge pending improvement in blood sugars off D5 ? ?Discharge Diagnoses:  ?Principal Problem: ?  Frontotemporal dementia (HCC) ?Active Problems: ?  Sepsis due to pneumonia [facility]) ?  MRSA UTI ?  Hypoglycemia ?  Acute on chronic intracranial subdural hematoma (HCC) ?  History of pulmonary embolus (PE) ?  MDD (major depressive disorder) ?  Protein-calorie malnutrition, severe ?  Goals of care, counseling/discussion ? ? ?Discharge Condition: stable ? ?Diet recommendation: heart healthy ? ?Filed Weights  ? 01/23/22 0700  ?Weight: 63.2 kg  ? ? ?History of present illness:  ?66 year old with history of HTN, major depressive disorder, nonverbal at baseline but usually ambulates brought to the hospital from [facility] facility for change in mental status and fever.  Initially there was concerns of aspiration pneumonia therefore started on IV vancomycin, Maxipime and Flagyl.  CT also showed possible early small bowel obstruction and case was discussed by ED physician with Dr. Cliffton Asters from general surgery who did not think patient has SBO.  Due to his recurrent ongoing dementia with delirium and readmissions, patient's sister is interested in discussion with palliative care.  Urine culture showed MRSA UTI. ? ?At this time, planning to monitor blood sugars for 24 hours prior to discharge.  Echo is pending.  If blood sugars look ok tomorrow, he'll be appropriate for discharge with antibiotics for MRSA UTI (concern for occult bacteremia).   ?  ? ?Hospital Course:   ?Assessment and Plan: ?* Frontotemporal dementia (HCC) ?With delirium, now reportedly close to baseline ?CT head with decrease in size of acute on chronic L subdural hematoma (discussed by prior MD with nsgy, ok for anticoagulation prn) ?Delirium precautions ?Prn ativan ?Scheduled risperdal, cogentin, prn trazodone ?At home on long acting injectable haldol and cogentin - per palliative care, recommended discussion long term regarding appropriateness and whether another med like risperdal (which would allow d/c cogentin) may be better option - considering risk of falls. ? ? ?Sepsis due to pneumonia [facility]) ?CT with findings c/w aspiration pneumonia ?MRSA PCR positive ?Negative covid, influenza ?Urine strep, legionella ?Blood culture NGx5 ?No sputum cx able to be collected to this point ?Currently on vanc/unasyn.  He's completed 8 days unasyn.  Discharging with linezolid due to MRSA UTI> ? ? ?MRSA UTI ?Blood cultures negative, but will treat as occult bacteremia ?Follow echo ?Discussed with ID who recommends 14 days abx, complete course with linezolid ? ?Hypoglycemia ?He's been on D5 up until this point ?Stop this now and follow off of this x24 hrs prior to discharge ?Fasting BG's on BMP's all wnl, I suspect the finger sticks maybe unreliable for him at times.  Will follow finger sticks, confirm with venous draw prior to treating if asymptomatic. ? ?History of pulmonary embolus (PE) ?Hx PE and right popliteal DVT, Nov 2022 ?No longer on anticoagulation due to fall risk ?LE [name] negative here for DVT ? ?Acute on chronic intracranial subdural hematoma (HCC) ?Noted ? ?Goals of care, counseling/discussion ?Planning for outpatient palliative care ? ? ?Procedures: ?none  ? ?Consultations: ?none ? ?Discharge Exam: ?Vitals:  ? 01/24/22 2057 01/25/22 0408  ?BP: (!) 142/86  128/77  ?Pulse: 75 (!) 58  ?Resp: 18 18  ?Temp: (!) 97.4 ?F (36.3 ?C) (!) 97.4 ?F (36.3 ?C)  ?SpO2: 100% 98%  ? ?Confused, pleasantly ?Discussed with  sister ? ?General: No acute distress. ?Cardiovascular: RRR ?Lungs: unlabored ?Abdomen: Soft, nontender, nondistended  ?Neurological: pleasantly confused ?Skin: Warm and dry. No rashes or lesions. ?Extremities: No clubbing or cyanosis. No edema. ? ?Discharge Instructions ? ? ?Discharge Instructions   ? ? Call MD for:  difficulty breathing, headache or visual disturbances   Complete by: As directed ?  ? Call MD for:  extreme fatigue   Complete by: As directed ?  ? Call MD for:  hives   Complete by: As directed ?  ? Call MD for:  persistant dizziness or light-headedness   Complete by: As directed ?  ? Call MD for:  persistant nausea and vomiting   Complete by: As directed ?  ? Call MD for:  redness, tenderness, or signs of infection (pain, swelling, redness, odor or green/yellow discharge around incision site)   Complete by: As directed ?  ? Call MD for:  severe uncontrolled pain   Complete by: As directed ?  ? Call MD for:  temperature >100.4   Complete by: As directed ?  ? Diet - low sodium heart healthy   Complete by: As directed ?  ? Discharge instructions   Complete by: As directed ?  ? You were seen for pneumonia. ? ?You also had MRSA in your urine.   ? ?We've treated you with antibiotics for your urine infection and pneumonia.  We've given you an extended course for your UTI at discharge due to concern for possible occult bacteremia with MRSA UTI.  This should be followed outpatient. ? ?Please discuss your antipsychotics with your primary prescribers outpatient.  Could consider transition to risperdal instead of haldol (palliative care recommended readdressing this with outpatient provider).  Transition off of haldol could allow stopping cogentin which could contribute to falls and delirium as well. ? ?Return for new, recurrent, or worsening symptoms. ? ?Please ask your PCP to request records from this hospitalization so they know what was done and what the next steps will be.  ? Increase activity slowly    Complete by: As directed ?  ? No wound care   Complete by: As directed ?  ? ?  ? ?Allergies as of 01/25/2022   ?No Known Allergies ?  ? ?  ?Medication List  ?  ? ?STOP taking these medications   ? ?clopidogrel 75 MG tablet ?Commonly known as: PLAVIX ?  ? ?  ? ?TAKE these medications   ? ?acetaminophen 500 MG tablet ?Commonly known as: TYLENOL ?Take 500 mg by mouth every 6 (six) hours as needed for headache, fever or mild pain. ?  ?acetaminophen 325 MG tablet ?Commonly known as: TYLENOL ?Take 2 tablets (650 mg total) by mouth every 4 (four) hours as needed for mild pain (or temp > 37.5 C (99.5 F)). ?  ?alum & mag hydroxide-simeth 200-200-20 MG/5ML suspension ?Commonly known as: MAALOX/MYLANTA ?Take 30 mLs by mouth every 6 (six) hours as needed for indigestion or heartburn. ?  ?benztropine 1 MG tablet ?Commonly known as: COGENTIN ?Take 1 tablet (1 mg total) by mouth 2 (two) times daily. ?  ?guaifenesin 100 MG/5ML syrup ?Commonly known as: ROBITUSSIN ?Take 200 mg by mouth every 6 (six) hours as needed for cough. ?  ?haloperidol decanoate 50 MG/ML injection ?Commonly known as: HALDOL DECANOATE ?Inject 50  mg into the muscle every 28 (twenty-eight) days. ?  ?linezolid 600 MG tablet ?Commonly known as: ZYVOX ?Take 1 tablet (600 mg total) by mouth 2 (two) times daily for 6 days. ?  ?loperamide 2 MG capsule ?Commonly known as: IMODIUM ?Take 2 mg by mouth as needed for diarrhea or loose stools (max 8 doses/ 24 hours). ?  ?magnesium hydroxide 400 MG/5ML suspension ?Commonly known as: MILK OF MAGNESIA ?Take 30 mLs by mouth at bedtime as needed (constipation). ?  ?NON FORMULARY ?Take 1 Can by mouth 3 (three) times daily. Mighty Shakes ?  ?nystatin powder ?Commonly known as: MYCOSTATIN/NYSTOP ?Apply 1 application topically 2 (two) times daily. ?  ?polyethylene glycol 17 g packet ?Commonly known as: MIRALAX / GLYCOLAX ?Take 17 g by mouth daily as needed for mild constipation. ?What changed: reasons to take this ?  ?Triple  Antibiotic [phone] Oint ?Apply 1 application topically daily as needed (minor skin tears/abrasians). ?  ? ?  ? ?No Known Allergies ? ? ? ?The results of significant diagnostics from this hospitalization (including imaging

## 2022-01-25 NOTE — Care Management Important Message (Signed)
Important Message ? ?Patient Details IM Letter placed in Patients room. ?Name: Micheal [name] ?MRN: [id number] ?Date of Birth: 27-Aug-1956 ? ? ?Medicare Important Message Given:  Yes ? ? ? ? ?Kerin Salen ?01/25/2022, 10:25 AM ?

## 2022-01-25 NOTE — TOC Progression Note (Addendum)
Transition of Care (TOC) - Progression Note  ? ? ?Patient Details  ?Name: Micheal [name] ?MRN: [id number] ?Date of Birth: [birth year] ? ?Transition of Care (TOC) CM/SW Contact  ?Lendy Dittrich, Meriam Sprague, RN ?Phone Number: ?01/25/2022, 9:51 AM ? ?Clinical Narrative:    ?Was informed by nursing staff that visitors in the pt room would like to speak with me. Went to pt room and introduced myself to visitors. Dorian from [employer] was in the room along with a man from another assisted living facility. This gentleman did not sign in as a facility representative in the [facility] office as is policy. Dorian states that the man came to assess the pt for another assisted living and they both feel like he is skilled level care. I attempted to help them understand the difference in the levels of care. Pt walked 300 ft 3 days ago with minimal assistance. He is physically able to move he is just inhibited by his dementia.  ? ?Both Doran and the gentleman became aggressive about their opinion of the situation. They began asking "what would you do if he came from home like this?" "What would you do if he didn't have local family to care for him" This CM decided to end the bullying conversation and offer for them to speak with my supervisor.  ? ? ?  ?

## 2022-01-25 NOTE — Progress Notes (Signed)
Cc- asked by dr Florene Glen to comment on mrsa in urine ? ? ?Patient with mild mod dementia admitted for aspiration pna ? ?Initial sepsis w/u with cxr and hx s/o pna. Bcx negative. Urine cx mrsa ? ?Improving on empiric bsABx ? ? ?Unclear about recent foley ?No hx mrsa sepsis  ? ? ? ?A/p ?If no clear hx recent foley, would w/u as occult mrsa bsi ?Tte ?Finish tx for pna ?Would complete 2 weeks tx for occul t mrsa bacteremia. ?Can transition to po linezolid to finish 14 day course ? ? ?Discussed with dr Florene Glen

## 2022-01-25 NOTE — Assessment & Plan Note (Addendum)
Blood cultures negative, but will treat as occult bacteremia ?Follow echo -> EF 55-60% (MV normal, AV not well visualized) ?Discussed with ID who recommends 14 days abx, complete course with linezolid at discharge (as above, linezolid rx will need to be adjusted at discharge to complete 14 day course). ?

## 2022-01-25 NOTE — Progress Notes (Signed)
Physical Therapy Treatment ?Patient Details ?Name: Micheal [name] ?MRN: [id number] ?DOB: [birth year] ?Today's Date: 01/25/2022 ? ? ?History of Present Illness Pt is 66 yo male admitted 01/18/22 with sepsis due to PNE and metabolic encephalopathy, acute on chronic subdural hematoma.  Pt with hx including dementia - nonverbal, major depressive disorder, and HTN. ? ?  ?PT Comments  ? ? Pt remains limited by cognition.  His transfer levels varied widely during treatment based on cognition (ex: mod A to stand when cued but then standing with min guard on his own randomly).  Pt did need increased assist today for ambulation (150' mod A with R drifting) but not able to follow any cues to correct balance. Did have assist of 2 for safety with line management and chair follow as pt not follow multimodal cues as well today.  Will continue acute PT to maintain mobility while hospitalized.  However, continue to recommend long term care without therapy at d/c as pt with no true rehab potential due to cognition. Pt may do better in familiar environment (from memory care);however, noted documentation of family/guardians concern over falls at facility- if facility not able to provide physical support may benefit from higher level long term care.  ?  ?Recommendations for follow up therapy are one component of a multi-disciplinary discharge planning process, led by the attending physician.  Recommendations may be updated based on patient status, additional functional criteria and insurance authorization. ? ?Follow Up Recommendations ? Long-term institutional care without follow-up therapy (return to memory care vs nursing home depending on level of assist provided) ?  ?  ?Assistance Recommended at Discharge Frequent or constant Supervision/Assistance  ?Patient can return home with the following A lot of help with walking and/or transfers;A lot of help with bathing/dressing/bathroom;Assistance with feeding;Direct supervision/assist for  medications management ?  ?Equipment Recommendations ? None recommended by PT  ?  ?Recommendations for Other Services   ? ? ?  ?Precautions / Restrictions Precautions ?Precautions: Fall  ?  ? ?Mobility ? Bed Mobility ?Overal bed mobility: Needs Assistance ?Bed Mobility: Supine to Sit, Sit to Supine ?  ?  ?Supine to sit: Supervision ?Sit to supine: Mod assist ?  ?General bed mobility comments: Initial supine to sit with mod A but due to pt not following commands. Pt performed 1 more time at end of session on his own when trying to get OOB.  Mod A to return to bed but b/c pt not following commands ?  ? ?Transfers ?Overall transfer level: Needs assistance ?Equipment used: 1 person hand held assist ?Transfers: Sit to/from Stand ?Sit to Stand: Min guard, Mod assist ?  ?  ?  ?  ?  ?General transfer comment: Performed sit to stand from bed and recliner with mod A when therapy trying to get pt to walk but then later just stood on his own with min guard for safety. ?  ? ?Ambulation/Gait ?Ambulation/Gait assistance: Mod assist, +2 safety/equipment ?Gait Distance (Feet): 150 Feet ?Assistive device: 1 person hand held assist, IV Pole ?Gait Pattern/deviations: Step-to pattern, Narrow base of support, Scissoring ?Gait velocity: slow ?  ?  ?General Gait Details: Had assist of 2 today for line management and chair follow as pt not following directions as well.  He ambulated 150' in hallway but tending to go to the right requiring min-mod A for balance.  Pt at times holding IV pole with L hand vs HHA.  He would reach for random items in hallway requiring assist of tech to get  him to release and keep walking ? ? ?Stairs ?  ?  ?  ?  ?  ? ? ?Wheelchair Mobility ?  ? ?Modified Rankin (Stroke Patients Only) ?  ? ? ?  ?Balance Overall balance assessment: Needs assistance ?Sitting-balance support: No upper extremity supported ?Sitting balance-Leahy Scale: Good ?  ?  ?Standing balance support: Single extremity supported ?Standing  balance-Leahy Scale: Poor ?Standing balance comment: Requiring HHA with gait.  Varied performance - min-mod A with ambulation with lean R, but then could stand at bedside on his own ?  ?  ?  ?  ?  ?  ?  ?  ?  ?  ?  ?  ? ?  ?Cognition Arousal/Alertness: Awake/alert ?Behavior During Therapy: Restless ?Overall Cognitive Status: History of cognitive impairments - at baseline ?  ?  ?  ?  ?  ?  ?  ?  ?  ?  ?  ?  ?  ?  ?  ?  ?General Comments: Pt likely at baseline cogntion - hx of dementia, non-verbal.  Pt not able to follow verbal commands - did follow general mobility commands at times with tactile cues ?  ?  ? ?  ?Exercises   ? ?  ?General Comments General comments (skin integrity, edema, etc.): Discussed limited/no rehab potential with MD and CM ?  ?  ? ?Pertinent Vitals/Pain Pain Assessment ?Pain Assessment: No/denies pain  ? ? ?Home Living   ?  ?  ?  ?  ?  ?  ?  ?  ?  ?   ?  ?Prior Function    ?  ?  ?   ? ?PT Goals (current goals can now be found in the care plan section) Progress towards PT goals: Not progressing toward goals - comment (limited by cognition) ? ?  ?Frequency ? ? ? Min 2X/week ? ? ? ?  ?PT Plan Current plan remains appropriate  ? ? ?Co-evaluation   ?  ?  ?  ?  ? ?  ?AM-PAC PT "6 Clicks" Mobility   ?Outcome Measure ? Help needed turning from your back to your side while in a flat bed without using bedrails?: A Lot (mod cues for all) ?Help needed moving from lying on your back to sitting on the side of a flat bed without using bedrails?: A Lot ?Help needed moving to and from a bed to a chair (including a wheelchair)?: A Lot ?Help needed standing up from a chair using your arms (e.g., wheelchair or bedside chair)?: A Lot ?Help needed to walk in hospital room?: A Lot ?Help needed climbing 3-5 steps with a railing? : A Lot ?6 Click Score: 12 ? ?  ?End of Session Equipment Utilized During Treatment: Gait belt ?Activity Tolerance: Patient tolerated treatment well;Other (comment) (limited due to  cognition) ?Patient left: in bed;with call bell/phone within reach;with bed alarm set (mittens in place, TV turned to sports per note in room) ?Nurse Communication: Mobility status ?PT Visit Diagnosis: Other abnormalities of gait and mobility (R26.89);Muscle weakness (generalized) (M62.81) ?  ? ? ?Time: 3794-3276 ?PT Time Calculation (min) (ACUTE ONLY): 26 min ? ?Charges:  $Gait Training: 8-22 mins ?$Therapeutic Activity: 8-22 mins          ?          ? ?Anise Salvo, PT ?Acute Rehab Services ?Pager [phone] ?[name] [phone] ? ? ? ?Billey Chang Larue Lightner ?01/25/2022, 11:36 AM ? ?

## 2022-01-25 NOTE — Progress Notes (Signed)
Crystal from [employer] called and requested Video call to see the pt get up and worked. The nursing did just, Crystal was satisfied seeing the pt got up and walked.  ?Will continue to monitor.  ?

## 2022-01-25 NOTE — TOC Progression Note (Signed)
Transition of Care (TOC) - Progression Note  ? ? ?Patient Details  ?Name: Micheal [name] ?MRN: [id number] ?Date of Birth: March 18, 1956 ? ?Transition of Care (TOC) CM/SW Contact  ?Medical [place], RN ?Phone Number: ?01/25/2022, 9:40 AM ? ?Clinical Narrative:    ?Received call from RN CM requesting assistance with family representative with questions regarding level of care. Family representative requesting to speak with [facility] supervisor.  ? ?TOC supervisor spoke to Micheal [name]-guardian of the estate who reports concerns related to patient returning to ALF where he currently resides. Reports she feels as though patient is more appropriate for long term level of care. Writer attempted to discuss the difference in levels of care and safety concerns associated with each however Micheal [name] continued to interrupt and speak over Clinical research associate. Writer questioned if [facility] supervisor could have time to explain the difference and Micheal [name] appeared to become frustrated asking for writers name and title which was graciously provided. Writer questioned patients current status as most recent PT eval reported patient was ambulating 300' with min assistance. Micheal [name] reports patient is currently lying in bed curled up in fetal position and was not ambulatory at all. TOC supervisor attempted to speak when Micheal [name] stopped conversation reporting "doctor had entered room and she would prefer to speak to him". Micheal [name] disconnected call.  ? ?TOC supervisor updated RN CM who reports she will speak with MD after conversation and update as needed.  ? ? ?  ?  ? ?Expected Discharge Plan and Services ?  ?  ?  ?  ?  ?                ?  ?  ?  ?  ?  ?  ?  ?  ?  ?  ? ? ?Social Determinants of Health (SDOH) Interventions ?  ? ?Readmission Risk Interventions ?   ? View : No data to display.  ?  ?  ?  ? ? ?

## 2022-01-26 DIAGNOSIS — F0280 Dementia in other diseases classified elsewhere without behavioral disturbance: Secondary | ICD-10-CM | POA: Diagnosis not present

## 2022-01-26 DIAGNOSIS — G3109 Other frontotemporal dementia: Secondary | ICD-10-CM | POA: Diagnosis not present

## 2022-01-26 LAB — BASIC METABOLIC PANEL
Anion gap: 5 (ref 5–15)
BUN: 13 mg/dL (ref 8–23)
CO2: 31 mmol/L (ref 22–32)
Calcium: 8.5 mg/dL — ABNORMAL LOW (ref 8.9–10.3)
Chloride: 105 mmol/L (ref 98–111)
Creatinine, Ser: 0.63 mg/dL (ref 0.61–1.24)
GFR, Estimated: >60 mL/min (ref >60)
Glucose, Bld: 96 mg/dL (ref 70–99)
Potassium: 3.8 mmol/L (ref 3.5–5.1)
Sodium: 141 mmol/L (ref 135–145)

## 2022-01-26 LAB — CBC
HCT: 40.5 % (ref 39.0–52.0)
Hemoglobin: 13.2 g/dL (ref 13.0–17.0)
MCH: 32.4 pg (ref 26.0–34.0)
MCHC: 32.6 g/dL (ref 30.0–36.0)
MCV: 99.5 fL (ref 80.0–100.0)
Platelets: 271 10*3/uL (ref 150–400)
RBC: 4.07 MIL/uL — ABNORMAL LOW (ref 4.22–5.81)
RDW: 12.7 % (ref 11.5–15.5)
WBC: 6.3 10*3/uL (ref 4.0–10.5)
nRBC: 0 % (ref 0.0–0.2)

## 2022-01-26 LAB — MAGNESIUM: Magnesium: 2.4 mg/dL (ref 1.7–2.4)

## 2022-01-26 LAB — GLUCOSE, CAPILLARY: Glucose-Capillary: 76 mg/dL (ref 70–99)

## 2022-01-26 MED ORDER — LINEZOLID 600 MG PO TABS
600.0000 mg | ORAL_TABLET | Freq: Two times a day (BID) | ORAL | Status: DC
  Filled 2022-01-26: qty 1

## 2022-01-26 MED ORDER — VANCOMYCIN HCL IN DEXTROSE 1-5 GM/200ML-% IV SOLN
1000.0000 mg | Freq: Two times a day (BID) | INTRAVENOUS | Status: DC
  Administered 2022-01-26 – 2022-01-29 (×6): 1000 mg via INTRAVENOUS
  Filled 2022-01-26 (×8): qty 200

## 2022-01-26 NOTE — Progress Notes (Signed)
Pharmacy Antibiotic Note ? ?Micheal [name] is a 66 y.o. male admitted on 01/18/2022 with  UTI and aspiration PNA .  Pharmacy has been consulted for Vancomycin dosing. ? ?Plan initially was vancomycin to be for 7 days then to transition to linezolid. Currently vancomycin is being ordered to resume. Not planning to resume Unasyn at the current time. Treating for MRSA UTI, treating for occult bacteremia ? ? ?ID: Sepsis - unk source (asp PNA v UTI). Con't abx to cover for both per Amin. LA WNL. No O2 requirement ?- Afebrile, WBC WNL, Scr<1 ? ?4/14 Cefepime >> 4/17 ?4/14 Vancomycin >>  ?4/17 Unasyn>> 4/21  ?4/14 Metronidazole >>4/17 ? ?4/14 Resp panel: Neg ?4/14 UCx: >100,000 MRSA ?4/14 Bcx x2: NGTD ?4/15 mrsa pcr (+) ? ?Plan: ?- Vanc 1gm IV q12h (rounded Scr 0.8, TBW, AUC 457, Cmin 12) ?  ? ?Height: 6' (182.9 cm) ?Weight: 63.2 kg (139 lb 5.3 oz) ?IBW/kg (Calculated) : 77.6 ? ?Temp (24hrs), Avg:97.5 ?F (36.4 ?C), Min:97.4 ?F (36.3 ?C), Max:97.6 ?F (36.4 ?C) ? ?Recent Labs  ?Lab 01/22/22 ?1884 01/23/22 ?1660 01/24/22 ?6301 01/25/22 ?6010 01/26/22 ?9323  ?WBC 6.3 5.8 4.9 6.4 6.3  ?CREATININE 0.46* 0.59* 0.62 0.61 0.63  ? ?  ?Estimated Creatinine Clearance: 82.3 mL/min (by C-G formula based on SCr of 0.63 mg/dL).   ? ?No Known Allergies ? ? ?Adalberto Cole, PharmD, BCPS ?01/26/2022 3:10 PM ? ? ? ?

## 2022-01-26 NOTE — TOC Progression Note (Signed)
Transition of Care (TOC) - Progression Note  ? ? ?Patient Details  ?Name: Micheal [name] ?MRN: [id number] ?Date of Birth: 30-May-1956 ? ?Transition of Care (TOC) CM/SW Contact  ?Tawanna Cooler, RN ?Phone Number: ?01/26/2022, 12:43 PM ? ?Clinical Narrative:    ? ?Received a message from attending asking if [facility] can accept patient back if he needs assistance with feeding.  CM unsure, called [facility], but had to leave a [employer].  Currently awaiting a return call.  ?

## 2022-01-26 NOTE — Plan of Care (Signed)
Plan of care reviewed. 

## 2022-01-26 NOTE — Progress Notes (Signed)
?PROGRESS NOTE ? ? ? ?Micheal [name]  [id number] DOB: 26-Sep-1956 DOA: 01/18/2022 ?PCP: No primary care provider on file.  ?Chief Complaint  ?Patient presents with  ? Altered Mental Status  ? Fever  ?  Pt arrived via EMS from [facility] facility. Staff reported pt has been altered from baseline, pt usually ambulatory.  ? ? ?Brief Narrative:  ?66 year old with history of HTN, major depressive disorder, nonverbal at baseline but usually ambulates brought to the hospital from [facility] facility for change in mental status and fever.  Initially there was concerns of aspiration pneumonia therefore started on IV vancomycin, Maxipime and Flagyl.  CT also showed possible early small bowel obstruction and case was discussed by ED physician with Dr. Dema Severin from general surgery who did not think patient has SBO.  Due to his recurrent ongoing dementia with delirium and readmissions, patient's sister is interested in discussion with palliative care.  Urine culture showed MRSA UTI. ? ?At this time, was planning for discharge to [facility] today, but waiting to hear back, some concerns regarding his ability to feed himself and whether they can do that there, will need to clarify.  Continuing vanc for now.   ?   ? ? ?Assessment & Plan: ?  ?Principal Problem: ?  Frontotemporal dementia ([place]) ?Active Problems: ?  Sepsis due to pneumonia [facility]) ?  MRSA UTI ?  Hypoglycemia ?  Acute on chronic intracranial subdural hematoma (HCC) ?  History of pulmonary embolus (PE) ?  MDD (major depressive disorder) ?  Protein-calorie malnutrition, severe ?  Goals of care, counseling/discussion ? ? ?Assessment and Plan: ?* Frontotemporal dementia ([place]) ?With delirium, now reportedly close to baseline ?CT head with decrease in size of acute on chronic L subdural hematoma (discussed by prior MD with nsgy, ok for anticoagulation prn) ?Delirium precautions ?Prn ativan ?Scheduled risperdal, cogentin, prn trazodone ?At home on long acting  injectable haldol and cogentin - per palliative care, recommended discussion long term regarding appropriateness and whether another med like risperdal (which would allow d/c cogentin) may be better option - considering risk of falls. ?Planning to discharge back to [facility].  There were concerns about his ability to feed himself (RN yesterday said he fed himself, but RN and tech today mentioned he needed help), will need to confirm they'll be able to feed him. Haven't heard back from [facility] yet.   ? ?Sepsis due to pneumonia [facility]) ?CT with findings c/w aspiration pneumonia ?MRSA PCR positive ?Negative covid, influenza ?Urine strep, legionella ?Blood culture NGx5 ?No sputum cx able to be collected to this point ?Currently on vanc/unasyn.  He's completed 8 days unasyn.  Will continue vanc in house and plan on Discharging with linezolid due to MRSA UTI. ? ? ?MRSA UTI ?Blood cultures negative, but will treat as occult bacteremia ?Follow echo -> EF 55-60% (MV normal, AV not well visualized) ?Discussed with ID who recommends 14 days abx, complete course with linezolid at discharge ? ?Hypoglycemia ?Improved, follow on AM BMP's ? ?History of pulmonary embolus (PE) ?Hx PE and right popliteal DVT, Nov 2022 ?No longer on anticoagulation due to fall risk ?LE [name] negative here for DVT ? ?Acute on chronic intracranial subdural hematoma (HCC) ?Noted ? ?Goals of care, counseling/discussion ?Planning for outpatient palliative care ? ? ?DVT prophylaxis: SCD ?Code Status: DNR ?Family Communication: none ?Disposition:  ? ?Status is: Inpatient ?Remains inpatient appropriate because: pending safe discharge ?  ?Consultants:  ?palliative ? ?Procedures:  ?Echo ?IMPRESSIONS  ? ? ?  1. Left ventricular ejection fraction, by estimation, is 55 to 60%. The  ?left ventricle has normal function. The left ventricle has no regional  ?wall motion abnormalities. Left ventricular diastolic parameters are  ?indeterminate.  ? 2. Right  ventricular systolic function is normal. The right ventricular  ?size is normal.  ? 3. The mitral valve is normal in structure. No evidence of mitral valve  ?regurgitation. No evidence of mitral stenosis.  ? 4. The aortic valve was not well visualized. Aortic valve regurgitation  ?is not visualized. No aortic stenosis is present.  ? 5. Aortic dilatation noted. There is mild dilatation of the aortic root,  ?measuring 39 mm.  ? 6. The inferior vena cava is normal in size with greater than 50%  ?respiratory variability, suggesting right atrial pressure of 3 mmHg.  ? ?LE [name] ?Summary:  ?RIGHT:  ?- There is no evidence of deep vein thrombosis in the lower extremity.  ?   ?- No cystic structure found in the popliteal fossa.  ?   ?LEFT:  ?- There is no evidence of deep vein thrombosis in the lower extremity.  ?   ?- No cystic structure found in the popliteal fossa. ? ?Antimicrobials:  ?Anti-infectives (From admission, onward)  ? ? Start     Dose/Rate Route Frequency Ordered Stop  ? 01/26/22 1600  vancomycin (VANCOCIN) IVPB 1000 mg/200 mL premix       ? 1,000 mg ?200 mL/hr over 60 Minutes Intravenous Every 12 hours 01/26/22 1516    ? 01/26/22 1545  linezolid (ZYVOX) tablet 600 mg  Status:  Discontinued       ? 600 mg Oral Every 12 hours 01/26/22 1457 01/26/22 1506  ? 01/25/22 0000  linezolid (ZYVOX) 600 MG tablet       ? 600 mg Oral 2 times daily 01/25/22 1420 01/31/22 2359  ? 01/21/22 1200  Ampicillin-Sulbactam (UNASYN) 3 g in sodium chloride 0.9 % 100 mL IVPB       ? 3 g ?200 mL/hr over 30 Minutes Intravenous Every 6 hours 01/21/22 1051 01/25/22 1750  ? 01/19/22 1000  vancomycin (VANCOCIN) IVPB 1000 mg/200 mL premix       ? 1,000 mg ?200 mL/hr over 60 Minutes Intravenous Every 12 hours 01/19/22 0152 01/25/22 2317  ? 01/19/22 0800  metroNIDAZOLE (FLAGYL) IVPB 500 mg  Status:  Discontinued       ? 500 mg ?100 mL/hr over 60 Minutes Intravenous Every 12 hours 01/19/22 0158 01/21/22 1050  ? 01/19/22 0400  ceFEPIme (MAXIPIME) 2  g in sodium chloride 0.9 % 100 mL IVPB  Status:  Discontinued       ? 2 g ?200 mL/hr over 30 Minutes Intravenous Every 8 hours 01/18/22 2346 01/21/22 1050  ? 01/18/22 1945  ceFEPIme (MAXIPIME) 2 g in sodium chloride 0.9 % 100 mL IVPB       ? 2 g ?200 mL/hr over 30 Minutes Intravenous  Once 01/18/22 1939 01/18/22 2122  ? 01/18/22 1945  metroNIDAZOLE (FLAGYL) IVPB 500 mg       ? 500 mg ?100 mL/hr over 60 Minutes Intravenous  Once 01/18/22 1939 01/18/22 2122  ? 01/18/22 1945  vancomycin (VANCOCIN) IVPB 1000 mg/200 mL premix  Status:  Discontinued       ? 1,000 mg ?200 mL/hr over 60 Minutes Intravenous  Once 01/18/22 1939 01/18/22 1942  ? 01/18/22 1945  vancomycin (VANCOREADY) IVPB 1500 mg/300 mL       ? 1,500 mg ?150 mL/hr over 120  Minutes Intravenous  Once 01/18/22 1942 01/19/22 0026  ? ?  ? ? ?Subjective: ?No complaints ?confused ? ?Objective: ?Vitals:  ? 01/25/22 0408 01/25/22 1719 01/25/22 2043 01/26/22 0434  ?BP: 128/77 128/78 (!) 119/92 137/83  ?Pulse: (!) 58 62 60 71  ?Resp: 18 17 18 18   ?Temp: (!) 97.4 ?F (36.3 ?C) 97.6 ?F (36.4 ?C) (!) 97.4 ?F (36.3 ?C) (!) 97.5 ?F (36.4 ?C)  ?TempSrc: Oral Oral Oral Oral  ?SpO2: 98% (!) 18% 97% 100%  ?Weight:      ?Height:      ? ? ?Intake/Output Summary (Last 24 hours) at 01/26/2022 1524 ?Last data filed at 01/26/2022 0901 ?Gross per 24 hour  ?Intake 575 ml  ?Output 500 ml  ?Net 75 ml  ? ?Filed Weights  ? 01/23/22 0700  ?Weight: 63.2 kg  ? ? ?Examination: ? ?General exam: Appears calm and comfortable  ?Respiratory system: unlabored ?Cardiovascular system: RRR ?Gastrointestinal system: Abdomen is nondistended, soft and nontender ?Central nervous system: disoriented, moving all extremities ?Extremities: no LEE ? ? ?Data Reviewed: I have personally reviewed following labs and imaging studies ? ?CBC: ?Recent Labs  ?Lab 01/22/22 ?[id number] 01/23/22 ?[id number] 01/24/22 ?[id number] 01/25/22 ?[id number] 01/26/22 ?[id number]  ?WBC 6.3 5.8 4.9 6.4 6.3  ?HGB 12.3* 13.5 9.7* 12.2* 13.2  ?HCT 36.9* 41.0 29.0* 37.0*  40.5  ?MCV 98.7 97.9 96.7 98.9 99.5  ?PLT 227 241 273 237 271  ? ? ?Basic Metabolic Panel: ?Recent Labs  ?Lab 01/22/22 ?[id number] 01/23/22 ?[id number] 01/24/22 ?[id number] 01/25/22 ?[id number] 01/26/22 ?[id number]  ?NA 140 142 142 143 141

## 2022-01-26 NOTE — Progress Notes (Addendum)
? ?                                                                                                                                                     ?                                                   ?Daily Progress Note  ? ?Patient Name: Micheal [name]       Date: 01/26/2022 ?DOB: 26-Jun-1956  Age: 66 y.o. MRN#: [id number] ?Attending Physician: Zigmund Daniel., * ?Primary Care Physician: No primary care provider on file. ?Admit Date: 01/18/2022 ? ?Reason for Consultation/Follow-up: Establishing goals of care ? ?Subjective: ?I saw and examined Micheal [name] today.  He remains minimally interactive but with no signs of distress. ? ?On arrival to the room, there were 2 other people in his room.  This included Micheal [name] from [employer] of guardianship who has financial guardianship over Micheal [name].  His sister, Micheal [name], had specifically asked yesterday for me to discuss his case with her and Micheal [name] (attorney for Corporation guardianship). ? ?We discussed concerns about Micheal [name] and his current functional status.  Micheal [name] and Micheal [name] are concerned about his ability to be cared for effectively at [facility].  Micheal [name] points out the fact that he has had multiple admissions or ED visits for falls including in February, March, and April of this year Micheal [name] had expressed concern to me that she thinks this is related to Haldol shot).  She expressed that she and Micheal [name] both feel that [facility] is not a safe discharge plan and she is expressed frustrated that the hospital will not assist in finding him a higher level of care when they feel this would be in his best interest for safety and he has means to private pay for higher level custodial care. ? ?She was appreciative of conversation regarding medical necessity for hospitalization and I provided her with the number for office of patient experience.  I also was sure to inform her that that he should have received a copy of his Medicare rights  as there was a question about what would be the next steps if they disagreed with plan for his discharge. ? ?Length of Stay: 8 ? ?Current Medications: ?Scheduled Meds:  ? benztropine  1 mg Oral BID  ? dextrose  25 mL Intravenous Once  ? feeding supplement  1 Container Oral BID WC  ? feeding supplement  237 mL Oral BID BM  ? guaiFENesin  600 mg Oral BID  ? mouth rinse  15 mL Mouth Rinse BID  ? multivitamin with minerals  1 tablet Oral Daily  ? mupirocin ointment  1 application. Nasal BID  ? risperiDONE  0.5 mg Oral QHS  ? ? ?Continuous Infusions: ? ? ? ?PRN Meds: ?acetaminophen **OR** acetaminophen, dextromethorphan-guaiFENesin, glucagon (human recombinant), hydrALAZINE, HYDROcodone-acetaminophen, ipratropium-albuterol, LORazepam, morphine injection, ondansetron **OR** ondansetron (ZOFRAN) IV, senna-docusate, traZODone ? ?Physical Exam         ?General: Sleepy today.  Smiles occasionally but remains with minimal interaction. ? ?He was resting comfortably and no encounter and therefore I did not disturb him for further exam today. ? ?Vital Signs: BP 137/83 (BP Location: Left Arm)   Pulse 71   Temp (!) 97.5 ?F (36.4 ?C) (Oral)   Resp 18   Ht 6' (1.829 m)   Wt 63.2 kg   SpO2 100%   BMI 18.90 kg/m?  ?SpO2: SpO2: 100 % ?O2 Device: O2 Device: Room Air ?O2 Flow Rate:   ? ?Intake/output summary:  ?Intake/Output Summary (Last 24 hours) at 01/26/2022 0913 ?Last data filed at 01/26/2022 0900 ?Gross per 24 hour  ?Intake 338 ml  ?Output 500 ml  ?Net -162 ml  ? ? ?LBM: Last BM Date :  (PTA) ?Baseline Weight: Weight: 63.2 kg ?Most recent weight: Weight: 63.2 kg ? ?     ?Palliative Assessment/Data: ? ? ? ?Flowsheet Rows   ? ?Flowsheet Row Most Recent Value  ?Intake Tab   ?Referral Department Hospitalist  ?Unit at Time of Referral Med/Surg Unit  ?Palliative Care Primary Diagnosis Neurology  ?Date Notified 01/21/22  ?Palliative Care Type New Palliative care  ?Reason for referral Clarify Goals of Care  ?Date of Admission 01/18/22   ?Date first seen by Palliative Care 01/22/22  ?# of days Palliative referral response time 1 Day(s)  ?# of days IP prior to Palliative referral 3  ?Clinical Assessment   ?Palliative Performance Scale Score 50%  ?Psychosocial & Spiritual Assessment   ?Palliative Care Outcomes   ?Patient/Family meeting held? Yes  ?Who was at the meeting? Sister  ?Palliative Care Outcomes Counseled regarding hospice  ? ?  ? ? ?Patient Active Problem List  ? Diagnosis Date Noted  ? Frontotemporal dementia with behavioral disturbance (HCC) 03/01/2020  ? Dehydration with hypernatremia 03/01/2020  ? Essential hypertension 03/01/2020  ? Sepsis due to pneumonia (HCC) 01/18/2022  ? MRSA UTI 01/25/2022  ? Hypoglycemia 01/25/2022  ? Frontotemporal dementia (HCC) 01/23/2022  ? Acute on chronic intracranial subdural hematoma (HCC) 01/23/2022  ? History of pulmonary embolus (PE) 01/23/2022  ? MDD (major depressive disorder) 01/23/2022  ? Protein-calorie malnutrition, severe 01/21/2022  ? Goals of care, counseling/discussion 01/23/2022  ? Unstable gait 08/06/2021  ? Weakness   ? ? ?Palliative Care Assessment & Plan  ? ?Patient Profile: ?66 y.o. male  with past medical history of hypertension, depressive disorder, documented to be nonverbal at baseline, long-term resident of [place] admitted on 01/18/2022 with concern for aspiration pneumonia and concern for bowel obstruction.  Surgery discussed with ED physician and feel not likely this is SBO.  Recommendation from primary hospitalist to his sister has been for hospice at time of discharge.  Palliative consulted for goals of care. ? ?Recommendations/Plan: ?DNR/DNI ?Discussed today with Micheal [name] (from Corporation of guardianship who has financial guardianship over Micheal [name] and who is assisting patient's sister, Micheal [name], with healthcare decisions).  She and Micheal [name] are frustrated with plan for him to return to [facility].  Unfortunately, I do not think there is much I can do from  palliative care perspective to assist as this is disposition issue regarding his long-term care placement.  We did  discuss why he is not a candidate for skilled rehab.  I did pass along number for the office of patient experience and also let them know that he should have a document outlining his rights under Medicare. ?Discussed with Dr. Lowell GuitarPowell and plan is to have PT reassess to determine if he is ambulatory as this would be a large factor in appropriateness to return to assisted living. ? ?Code Status: ? ?  ?Code Status Orders  ?(From admission, onward)  ?  ? ? ?  ? ?  Start     Ordered  ? 01/19/22 0842  Do not attempt resuscitation (DNR)  Continuous       ?Question Answer Comment  ?In the event of cardiac or respiratory ARREST Do not call a ?code blue?   ?In the event of cardiac or respiratory ARREST Do not perform Intubation, CPR, defibrillation or ACLS   ?In the event of cardiac or respiratory ARREST Use medication by any route, position, wound care, and other measures to relive pain and suffering. May use oxygen, suction and manual treatment of airway obstruction as needed for comfort.   ?  ? 01/19/22 0841  ? ?  ?  ? ?  ? ?Code Status History   ? ? Date Active Date Inactive Code Status Order ID Comments User Context  ? 01/18/2022 2346 01/19/2022 0841 Full Code [id number]  Tonye RoyaltyHugelmeyer, Alexis, DO ED  ? 12/06/2021 0414 12/06/2021 1348 DNR [id number]  Marily MemosMesner, Jason, MD ED  ? 08/06/2021 0142 08/15/2021 0021 DNR [id number]  Charlsie QuestPatel, Vishal R, MD ED  ? 03/01/2020 0738 03/10/2020 2146 DNR [id number]  Marinda ElkShalhoub, George J, MD ED  ? 03/01/2020 [id number] 03/01/2020 0738 DNR [id number]  Shalhoub, Deno LungerGeorge J, MD ED  ? ?  ? ? ?Thank you for allowing the Palliative Medicine Team to assist in the care of this patient. ? ?Total time: 55 minutes ? ?Romie MinusGene Dehlia Kilner, MD ? ?Please contact Palliative Medicine Team phone at [phone] for questions and concerns.  ? ? ? ? ? ?

## 2022-01-27 DIAGNOSIS — F0280 Dementia in other diseases classified elsewhere without behavioral disturbance: Secondary | ICD-10-CM | POA: Diagnosis not present

## 2022-01-27 DIAGNOSIS — G3109 Other frontotemporal dementia: Secondary | ICD-10-CM | POA: Diagnosis not present

## 2022-01-27 LAB — CBC WITH DIFFERENTIAL/PLATELET
Abs Immature Granulocytes: 0.1 10*3/uL — ABNORMAL HIGH (ref 0.00–0.07)
Basophils Absolute: 0.1 10*3/uL (ref 0.0–0.1)
Basophils Relative: 1 %
Eosinophils Absolute: 0.1 10*3/uL (ref 0.0–0.5)
Eosinophils Relative: 2 %
HCT: 38.9 % — ABNORMAL LOW (ref 39.0–52.0)
Hemoglobin: 12.4 g/dL — ABNORMAL LOW (ref 13.0–17.0)
Immature Granulocytes: 2 %
Lymphocytes Relative: 26 %
Lymphs Abs: 1.6 10*3/uL (ref 0.7–4.0)
MCH: 32 pg (ref 26.0–34.0)
MCHC: 31.9 g/dL (ref 30.0–36.0)
MCV: 100.5 fL — ABNORMAL HIGH (ref 80.0–100.0)
Monocytes Absolute: 0.4 10*3/uL (ref 0.1–1.0)
Monocytes Relative: 7 %
Neutro Abs: 3.8 10*3/uL (ref 1.7–7.7)
Neutrophils Relative %: 62 %
Platelets: 250 10*3/uL (ref 150–400)
RBC: 3.87 MIL/uL — ABNORMAL LOW (ref 4.22–5.81)
RDW: 12.9 % (ref 11.5–15.5)
WBC: 6.2 10*3/uL (ref 4.0–10.5)
nRBC: 0 % (ref 0.0–0.2)

## 2022-01-27 LAB — COMPREHENSIVE METABOLIC PANEL
ALT: 27 U/L (ref 0–44)
AST: 23 U/L (ref 15–41)
Albumin: 2.8 g/dL — ABNORMAL LOW (ref 3.5–5.0)
Alkaline Phosphatase: 59 U/L (ref 38–126)
Anion gap: 5 (ref 5–15)
BUN: 13 mg/dL (ref 8–23)
CO2: 30 mmol/L (ref 22–32)
Calcium: 8.4 mg/dL — ABNORMAL LOW (ref 8.9–10.3)
Chloride: 107 mmol/L (ref 98–111)
Creatinine, Ser: 0.6 mg/dL — ABNORMAL LOW (ref 0.61–1.24)
GFR, Estimated: >60 mL/min (ref >60)
Glucose, Bld: 101 mg/dL — ABNORMAL HIGH (ref 70–99)
Potassium: 3.4 mmol/L — ABNORMAL LOW (ref 3.5–5.1)
Sodium: 142 mmol/L (ref 135–145)
Total Bilirubin: 0.7 mg/dL (ref 0.3–1.2)
Total Protein: 5.7 g/dL — ABNORMAL LOW (ref 6.5–8.1)

## 2022-01-27 LAB — MAGNESIUM: Magnesium: 2.5 mg/dL — ABNORMAL HIGH (ref 1.7–2.4)

## 2022-01-27 LAB — PHOSPHORUS: Phosphorus: 3.1 mg/dL (ref 2.5–4.6)

## 2022-01-27 NOTE — Progress Notes (Signed)
?PROGRESS NOTE ? ? ? ?Micheal GlassingBradford [name]  ZOX:[id number] DOB: June 01, 1956 DOA: 01/18/2022 ?PCP: No primary care provider on file.  ?Chief Complaint  ?Patient presents with  ? Altered Mental Status  ? Fever  ?  Pt [facility]. Staff reported pt has been altered from baseline, pt usually ambulatory.  ? ? ?Brief Narrative:  ?66 year old with history of HTN, major depressive disorder, nonverbal at baseline but usually ambulates brought to the hospital [facility] for change in mental status and fever.  Initially there was concerns of aspiration pneumonia therefore started on IV vancomycin, Maxipime and Flagyl.  CT also showed possible early small bowel obstruction and case was discussed by ED physician with Dr. Cliffton AstersWhite from general surgery who did not think patient has SBO.  Due to his recurrent ongoing dementia with delirium and readmissions, patient's sister is interested in discussion with palliative care.  Urine culture showed MRSA UTI. ? ?At this time, was planning for discharge [facility], but waiting to hear back, some concerns regarding his ability to feed himself and whether they can do that there, will need to clarify.  Continuing vanc for now.   ?   ? ? ?Assessment & Plan: ?  ?Principal Problem: ?  Frontotemporal dementia (HCC) ?Active Problems: ?  Sepsis due to pneumonia [facility](HCC) ?  MRSA UTI ?  Hypoglycemia ?  Acute on chronic intracranial subdural hematoma (HCC) ?  History of pulmonary embolus (PE) ?  MDD (major depressive disorder) ?  Protein-calorie malnutrition, severe ?  Goals of care, counseling/discussion ? ? ?Assessment and Plan: ?* Frontotemporal dementia (HCC) ?With delirium, now reportedly close to baseline ?CT head with decrease in size of acute on chronic L subdural hematoma (discussed by prior MD with nsgy, ok for anticoagulation prn) ?Delirium precautions ?Prn ativan ?Scheduled risperdal, cogentin, prn trazodone ?At home on long acting  injectable haldol and cogentin - per palliative care, recommended discussion long term regarding appropriateness and whether another med like risperdal (which would allow d/c cogentin) may be better option - considering risk of falls. ?[facility] to [facility].  There were concerns about his ability to feed himself (RN yesterday said he fed himself on 4/21, but hasn't done this over the weekend), will need to confirm they'll be able to feed him. [facility].  Concerns from sister about his safety there.   ? ?Sepsis due to pneumonia [facility](HCC) ?CT with findings c/w aspiration pneumonia ?MRSA PCR positive ?Negative covid, influenza ?Urine strep, legionella ?Blood culture NGx5 ?No sputum cx able to be collected to this point ?Currently on vanc/unasyn.  He's completed 8 days unasyn.  Will continue vanc in house and plan on Discharging with linezolid due to MRSA UTI. ? ? ?MRSA UTI ?Blood cultures negative, but will treat as occult bacteremia ?Follow echo -> EF 55-60% (MV normal, AV not well visualized) ?Discussed with ID who recommends 14 days abx, complete course with linezolid at discharge ? ?Hypoglycemia ?Improved, follow on AM BMP's ? ?History of pulmonary embolus (PE) ?Hx PE and right popliteal DVT, Nov 2022 ?No longer on anticoagulation due to fall risk ?LE US negative here for DVT ? ?Acute on chronic intracranial subdural hematoma (HCC) ?Noted ? ?Goals of care, counseling/discussion ?Planning for outpatient palliative care ? ? ?DVT prophylaxis: SCD ?Code Status: DNR ?Family Communication: none ?Disposition:  ? ?Status is: Inpatient ?Remains inpatient appropriate because: pending safe discharge ?  ?Consultants:  ?palliative ? ?  Procedures:  ?Echo ?IMPRESSIONS  ? ? ? 1. Left ventricular ejection fraction, by estimation, is 55 to 60%. The  ?left ventricle has normal function. The left ventricle has no regional  ?wall motion abnormalities. Left ventricular diastolic  parameters are  ?indeterminate.  ? 2. Right ventricular systolic function is normal. The right ventricular  ?size is normal.  ? 3. The mitral valve is normal in structure. No evidence of mitral valve  ?regurgitation. No evidence of mitral stenosis.  ? 4. The aortic valve was not well visualized. Aortic valve regurgitation  ?is not visualized. No aortic stenosis is present.  ? 5. Aortic dilatation noted. There is mild dilatation of the aortic root,  ?measuring 39 mm.  ? 6. The inferior vena cava is normal in size with greater than 50%  ?respiratory variability, suggesting right atrial pressure of 3 mmHg.  ? ?LE [name] ?Summary:  ?RIGHT:  ?- There is no evidence of deep vein thrombosis in the lower extremity.  ?   ?- No cystic structure found in the popliteal fossa.  ?   ?LEFT:  ?- There is no evidence of deep vein thrombosis in the lower extremity.  ?   ?- No cystic structure found in the popliteal fossa. ? ?Antimicrobials:  ?Anti-infectives (From admission, onward)  ? ? Start     Dose/Rate Route Frequency Ordered Stop  ? 01/26/22 1600  vancomycin (VANCOCIN) IVPB 1000 mg/200 mL premix       ? 1,000 mg ?200 mL/hr over 60 Minutes Intravenous Every 12 hours 01/26/22 1516    ? 01/26/22 1545  linezolid (ZYVOX) tablet 600 mg  Status:  Discontinued       ? 600 mg Oral Every 12 hours 01/26/22 1457 01/26/22 1506  ? 01/25/22 0000  linezolid (ZYVOX) 600 MG tablet       ? 600 mg Oral 2 times daily 01/25/22 1420 01/31/22 2359  ? 01/21/22 1200  Ampicillin-Sulbactam (UNASYN) 3 g in sodium chloride 0.9 % 100 mL IVPB       ? 3 g ?200 mL/hr over 30 Minutes Intravenous Every 6 hours 01/21/22 1051 01/25/22 1750  ? 01/19/22 1000  vancomycin (VANCOCIN) IVPB 1000 mg/200 mL premix       ? 1,000 mg ?200 mL/hr over 60 Minutes Intravenous Every 12 hours 01/19/22 0152 01/25/22 2317  ? 01/19/22 0800  metroNIDAZOLE (FLAGYL) IVPB 500 mg  Status:  Discontinued       ? 500 mg ?100 mL/hr over 60 Minutes Intravenous Every 12 hours 01/19/22 0158 01/21/22  1050  ? 01/19/22 0400  ceFEPIme (MAXIPIME) 2 g in sodium chloride 0.9 % 100 mL IVPB  Status:  Discontinued       ? 2 g ?200 mL/hr over 30 Minutes Intravenous Every 8 hours 01/18/22 2346 01/21/22 1050  ? 01/18/22 1945  ceFEPIme (MAXIPIME) 2 g in sodium chloride 0.9 % 100 mL IVPB       ? 2 g ?200 mL/hr over 30 Minutes Intravenous  Once 01/18/22 1939 01/18/22 2122  ? 01/18/22 1945  metroNIDAZOLE (FLAGYL) IVPB 500 mg       ? 500 mg ?100 mL/hr over 60 Minutes Intravenous  Once 01/18/22 1939 01/18/22 2122  ? 01/18/22 1945  vancomycin (VANCOCIN) IVPB 1000 mg/200 mL premix  Status:  Discontinued       ? 1,000 mg ?200 mL/hr over 60 Minutes Intravenous  Once 01/18/22 1939 01/18/22 1942  ? 01/18/22 1945  vancomycin (VANCOREADY) IVPB 1500 mg/300 mL       ?  1,500 mg ?150 mL/hr over 120 Minutes Intravenous  Once 01/18/22 1942 01/19/22 0026  ? ?  ? ? ?Subjective: ?No intelligble speech today after he woke up ? ?Objective: ?Vitals:  ? 01/26/22 1547 01/26/22 2026 01/27/22 0447 01/27/22 1423  ?BP: (!) 161/83 121/79 120/87 108/68  ?Pulse: 73 75 86 71  ?Resp: 18 18 18 15   ?Temp: 98.2 ?F (36.8 ?C) 97.6 ?F (36.4 ?C) 97.7 ?F (36.5 ?C) (!) 97.3 ?F (36.3 ?C)  ?TempSrc:  Oral Oral Oral  ?SpO2: 100% 96% 98% 98%  ?Weight:      ?Height:      ? ? ?Intake/Output Summary (Last 24 hours) at 01/27/2022 1740 ?Last data filed at 01/27/2022 1727 ?Gross per 24 hour  ?Intake 1549 ml  ?Output 2150 ml  ?Net -601 ml  ? ?Filed Weights  ? 01/23/22 0700  ?Weight: 63.2 kg  ? ? ?Examination: ? ?General: No acute distress. ?Cardiovascular: RRR ?Lungs: unlabored ?Abdomen: Soft, nontender, nondistended  ?Neurological: moving all extremities, opens eyes, but unintelligible speech ?Extremities: No clubbing or cyanosis. No edema ? ?Data Reviewed: I have personally reviewed following labs and imaging studies ? ?CBC: ?Recent Labs  ?Lab 01/23/22 ?01/25/22 01/24/22 ?01/26/22 01/25/22 ?01/27/22 01/26/22 ?01/28/22 01/27/22 ?01/29/22  ?WBC 5.8 4.9 6.4 6.3 6.2  ?NEUTROABS  --   --   --   --  3.8   ?HGB 13.5 9.7* 12.2* 13.2 12.4*  ?HCT 41.0 29.0* 37.0* 40.5 38.9*  ?MCV 97.9 96.7 98.9 99.5 100.5*  ?PLT 241 273 237 271 250  ? ? ?Basic Metabolic Panel: ?Recent Labs  ?Lab 01/23/22 ?01/25/22 01/24/22 ?01/26/22 04/21/

## 2022-01-28 ENCOUNTER — Encounter (HOSPITAL_COMMUNITY): Payer: Self-pay | Admitting: Family Medicine

## 2022-01-28 DIAGNOSIS — F0280 Dementia in other diseases classified elsewhere without behavioral disturbance: Secondary | ICD-10-CM | POA: Diagnosis not present

## 2022-01-28 DIAGNOSIS — G3109 Other frontotemporal dementia: Secondary | ICD-10-CM | POA: Diagnosis not present

## 2022-01-28 LAB — GLUCOSE, CAPILLARY: Glucose-Capillary: 88 mg/dL (ref 70–99)

## 2022-01-28 NOTE — TOC Progression Note (Addendum)
Transition of Care (TOC) - Progression Note  ? ? ?Patient Details  ?Name: Micheal [name] ?MRN: [id number] ?Date of Birth: [birth year] ? ?Transition of Care (TOC) CM/SW Contact  ?Tresia Revolorio, Meriam Sprague, RN ?Phone Number: ?01/28/2022, 10:20 AM ? ?Clinical Narrative:    ? ?Was asked by MD to ensure that [facility] can assist with feeding pt. Spoke with Environmental education officer at [employer]. She confirms that they do have pts there that need assistance with feeding. She states that pt was already on finger foods because he does not use utensils. She states that they are able to help him with feeding. ? ?Crystal watched pt walk with RN via Facetime this past Friday. She stated she was comfortable with caring for him at his current mobility level. ? ?Faxed pt out to the 3 locked SNFs in the area. Have reached out to Risk Management to get advice on this case. ? ?   ?Expected Discharge Date: 01/25/22               ?  ? Readmission Risk Interventions ?   ? View : No data to display.  ?  ?  ?  ? ? ?

## 2022-01-28 NOTE — Progress Notes (Signed)
?PROGRESS NOTE ? ? ? ?Micheal GlassingBradford Rowley  ZOX:[id number] DOB: 1956/02/22 DOA: 01/18/2022 ?PCP: No primary care provider on file.  ?Chief Complaint  ?Patient presents with  ? Altered Mental Status  ? Fever  ?  Pt arrived via EMS from [facility]. Staff reported pt has been altered from baseline, pt usually ambulatory.  ? ? ?Brief Narrative:  ?66 year old with history of HTN, major depressive disorder, nonverbal at baseline but usually ambulates brought to the hospital [facility] for change in mental status and fever.  Initially there was concerns of aspiration pneumonia therefore started on IV vancomycin, Maxipime and Flagyl.  CT also showed possible early small bowel obstruction and case was discussed by ED physician with Dr. Cliffton AstersWhite from general surgery who did not think patient has SBO.  Due to his recurrent ongoing dementia with delirium and readmissions, patient's sister is interested in discussion with palliative care.  Urine culture showed MRSA UTI. ? ?At this time, was planning for [facility], but waiting to hear back, some concerns regarding his ability to feed himself and whether they can do that there, will need to clarify.  Continuing vanc for now.   ?   ? ? ?Assessment & Plan: ?  ?Principal Problem: ?  Frontotemporal dementia (HCC) ?Active Problems: ?  Sepsis due to pneumonia [facility](HCC) ?  MRSA UTI ?  Hypoglycemia ?  Acute on chronic intracranial subdural hematoma (HCC) ?  History of pulmonary embolus (PE) ?  MDD (major depressive disorder) ?  Protein-calorie malnutrition, severe ?  Goals of care, counseling/discussion ? ? ?Assessment and Plan: ?* Frontotemporal dementia (HCC) ?With delirium, now reportedly close to baseline ?CT head with decrease in size of acute on chronic L subdural hematoma (discussed by prior MD with nsgy, ok for anticoagulation prn) ?Delirium precautions ?Prn ativan ?Scheduled risperdal, cogentin, prn trazodone ?At home on long acting  injectable haldol and cogentin - per palliative care, recommended discussion long term regarding appropriateness and whether another med like risperdal (which would allow d/c cogentin) may be better option - considering risk of falls. ? ?Initially planning to [facility] back to [facility].  Sister Eunice BlaseDebbie and the cooperation of guardianship has concerns about him returning.  When I spoke to them on Friday, main concerns seem to have been his inability to feed himself and the change in his ability to ambulate.  I spoke with [facility] Friday, they noted before he came to hospital, he was Nene Aranas frequent walker, he was getting up and walking without assistance and without any assistive device and was able to feed himself (double portion finger food).  I asked about their ability to help and assist with ambulating, etc and on Friday (4/21) was told essentially there was no 1:1 care.  On Friday, it was reported to me he was feeding himself, but this was not the case over the weekend - he's requiring total assistance here.  He's on Esme Durkin dysphagia 3 diet, will see about getting him finger foods that he might do better with.  He walked 150' with mod [employer]as with R drifting (with assist of 2 for safety) on 4/21 (staff from [facility] later watched him on video and they thought he'd be ok to return).  At this time, [facility] willing for him to come back with the noted concerns (and believes they'll be able to manage him), but family and cooperation of guardianship still has significant concerns regarding his change in status and need for  higher level of care.  I appreciate assistance of TOC with difficult case, they've faxed him out to local locked facilities, will see if anything comes available.     ? ?Sepsis due to pneumonia [facility]) ?CT with findings c/w aspiration pneumonia ?MRSA PCR positive ?Negative covid, influenza ?Urine strep, legionella ?Blood culture NGx5 ?No sputum cx able to be collected to this  point ?Currently on vanc/unasyn.  He's completed 8 days unasyn.  Will continue vanc in house and plan on Discharging with linezolid due to MRSA UTI. ? ? ?MRSA UTI ?Blood cultures negative, but will treat as occult bacteremia ?Follow echo -> EF 55-60% (MV normal, AV not well visualized) ?Discussed with ID who recommends 14 days abx, complete course with linezolid at discharge ? ?Hypoglycemia ?Improved, follow on AM BMP's ? ?History of pulmonary embolus (PE) ?Hx PE and right popliteal DVT, Nov 2022 ?No longer on anticoagulation due to fall risk ?LE [name] negative here for DVT ? ?Acute on chronic intracranial subdural hematoma (HCC) ?Noted ? ?Goals of care, counseling/discussion ?Planning for outpatient palliative care ? ? ?DVT prophylaxis: SCD ?Code Status: DNR ?Family Communication: discussed with Debbie and Dorian - discussed we've faxed him out to local locked facilities, if no one accepts him, [name] has reviewed his current status and believes they're able to care for him in his current state so it maybe our only option. ?Disposition:  ? ?Status is: Inpatient ?Remains inpatient appropriate because: pending safe discharge ?  ?Consultants:  ?palliative ? ?Procedures:  ?Echo ?IMPRESSIONS  ? ? ? 1. Left ventricular ejection fraction, by estimation, is 55 to 60%. The  ?left ventricle has normal function. The left ventricle has no regional  ?wall motion abnormalities. Left ventricular diastolic parameters are  ?indeterminate.  ? 2. Right ventricular systolic function is normal. The right ventricular  ?size is normal.  ? 3. The mitral valve is normal in structure. No evidence of mitral valve  ?regurgitation. No evidence of mitral stenosis.  ? 4. The aortic valve was not well visualized. Aortic valve regurgitation  ?is not visualized. No aortic stenosis is present.  ? 5. Aortic dilatation noted. There is mild dilatation of the aortic root,  ?measuring 39 mm.  ? 6. The inferior vena cava is normal in size with  greater than 50%  ?respiratory variability, suggesting right atrial pressure of 3 mmHg.  ? ?LE [name] ?Summary:  ?RIGHT:  ?- There is no evidence of deep vein thrombosis in the lower extremity.  ?   ?- No cystic structure found in the popliteal fossa.  ?   ?LEFT:  ?- There is no evidence of deep vein thrombosis in the lower extremity.  ?   ?- No cystic structure found in the popliteal fossa. ? ?Antimicrobials:  ?Anti-infectives (From admission, onward)  ? ? Start     Dose/Rate Route Frequency Ordered Stop  ? 01/26/22 1600  vancomycin (VANCOCIN) IVPB 1000 mg/200 mL premix       ? 1,000 mg ?200 mL/hr over 60 Minutes Intravenous Every 12 hours 01/26/22 1516    ? 01/26/22 1545  linezolid (ZYVOX) tablet 600 mg  Status:  Discontinued       ? 600 mg Oral Every 12 hours 01/26/22 1457 01/26/22 1506  ? 01/25/22 0000  linezolid (ZYVOX) 600 MG tablet       ? 600 mg Oral 2 times daily 01/25/22 1420 01/31/22 2359  ? 01/21/22 1200  Ampicillin-Sulbactam (UNASYN) 3 g in sodium chloride 0.9 % 100 mL IVPB       ?  3 g ?200 mL/hr over 30 Minutes Intravenous Every 6 hours 01/21/22 1051 01/25/22 1750  ? 01/19/22 1000  vancomycin (VANCOCIN) IVPB 1000 mg/200 mL premix       ? 1,000 mg ?200 mL/hr over 60 Minutes Intravenous Every 12 hours 01/19/22 0152 01/25/22 2317  ? 01/19/22 0800  metroNIDAZOLE (FLAGYL) IVPB 500 mg  Status:  Discontinued       ? 500 mg ?100 mL/hr over 60 Minutes Intravenous Every 12 hours 01/19/22 0158 01/21/22 1050  ? 01/19/22 0400  ceFEPIme (MAXIPIME) 2 g in sodium chloride 0.9 % 100 mL IVPB  Status:  Discontinued       ? 2 g ?200 mL/hr over 30 Minutes Intravenous Every 8 hours 01/18/22 2346 01/21/22 1050  ? 01/18/22 1945  ceFEPIme (MAXIPIME) 2 g in sodium chloride 0.9 % 100 mL IVPB       ? 2 g ?200 mL/hr over 30 Minutes Intravenous  Once 01/18/22 1939 01/18/22 2122  ? 01/18/22 1945  metroNIDAZOLE (FLAGYL) IVPB 500 mg       ? 500 mg ?100 mL/hr over 60 Minutes Intravenous  Once 01/18/22 1939 01/18/22 2122  ? 01/18/22 1945   vancomycin (VANCOCIN) IVPB 1000 mg/200 mL premix  Status:  Discontinued       ? 1,000 mg ?200 mL/hr over 60 Minutes Intravenous  Once 01/18/22 1939 01/18/22 1942  ? 01/18/22 1945  vancomycin (VANCOREADY) IVPB 1500 mg/300

## 2022-01-28 NOTE — [place] FL2 (Signed)
?[place] MEDICAID FL2 LEVEL OF CARE SCREENING TOOL  ?  ? ?IDENTIFICATION  ?Patient Name: ?Micheal [name] Birthdate: [birth year] Sex: male Admission Date (Current Location): ?01/18/2022  ?[place] and [place] Number: ? [place] ?  Facility and Address:  ?[facility],  [address], [place] ?     Provider Number: ?[id number]  ?Attending Physician Name and Address:  ?Elodia Florence., * ? Relative Name and Phone Number:  ?  ?   ?Current Level of Care: ?Hospital Recommended Level of Care: ?[place] (secured unit) Prior Approval Number: ?  ? ?Date Approved/Denied: ?  PASRR Number: ?  ? ?Discharge Plan: ?SNF ?  ? ?Current Diagnoses: ?Patient Active Problem List  ? Diagnosis Date Noted  ? MRSA UTI 01/25/2022  ? Hypoglycemia 01/25/2022  ? Frontotemporal dementia ([place]) 01/23/2022  ? Acute on chronic intracranial subdural hematoma (HCC) 01/23/2022  ? History of pulmonary embolus (PE) 01/23/2022  ? MDD (major depressive disorder) 01/23/2022  ? Goals of care, counseling/discussion 01/23/2022  ? Protein-calorie malnutrition, severe 01/21/2022  ? Sepsis due to pneumonia ([place]) 01/18/2022  ? Unstable gait 08/06/2021  ? Weakness   ? Frontotemporal dementia with behavioral disturbance ([place]) 03/01/2020  ? Dehydration with hypernatremia 03/01/2020  ? Essential hypertension 03/01/2020  ? ? ?Orientation RESPIRATION BLADDER Height & Weight   ?  ? (non verbal, not oriented at all) ? Normal Incontinent Weight: 63.2 kg ?Height:  6' (182.9 cm)  ?BEHAVIORAL SYMPTOMS/MOOD NEUROLOGICAL BOWEL NUTRITION STATUS  ?    Incontinent  (Regular diet/finger foods/double portion)  ?AMBULATORY STATUS COMMUNICATION OF NEEDS Skin   ?Limited Assist Does not communicate  (redness to head and sacrum) ?  ?  ?  ?    ?     ?     ? ? ?Personal Care Assistance Level of Assistance  ?Bathing, Feeding, Dressing Bathing Assistance: Maximum assistance ?Feeding assistance: Maximum assistance ?Dressing Assistance: Maximum  assistance ?   ? ?Functional Limitations Info  ?Sight, Hearing, Speech (unknown, pt is non-verbal)   ?  ?   ? ? ?SPECIAL CARE FACTORS FREQUENCY  ?PT (By licensed PT), OT (By licensed OT)   ?  ?PT Frequency: 2 x weekly ?OT Frequency: 2 x weekly ?  ?  ?  ?   ? ? ?Contractures Contractures Info: Not present  ? ? ?Additional Factors Info  ?Code Status, Allergies Code Status Info: DNR ?Allergies Info: none known ?  ?  ?  ?   ? ?Current Medications (01/28/2022):  This is the current hospital active medication list ?Current Facility-Administered Medications  ?Medication Dose Route Frequency Provider Last Rate Last Admin  ? acetaminophen (TYLENOL) tablet 650 mg  650 mg Oral Q6H PRN Hugelmeyer, Alexis, DO      ? Or  ? acetaminophen (TYLENOL) suppository 650 mg  650 mg Rectal Q6H PRN Hugelmeyer, Alexis, DO      ? benztropine (COGENTIN) tablet 1 mg  1 mg Oral BID Amin, Ankit Chirag, MD   1 mg at 01/28/22 0909  ? dextromethorphan-guaiFENesin (MUCINEX DM) 30-600 MG per 12 hr tablet 1 tablet  1 tablet Oral BID PRN Amin, Ankit Chirag, MD      ? feeding supplement (BOOST / RESOURCE BREEZE) liquid 1 Container  1 Container Oral BID WC Oswald Hillock, MD   1 Container at 01/28/22 [phone]  ? feeding supplement (ENSURE ENLIVE / ENSURE PLUS) liquid 237 mL  237 mL Oral BID BM Darrick Meigs, Marge Duncans, MD   237 mL  at 01/28/22 0909  ? glucagon (human recombinant) (GLUCAGEN) injection 1 mg  1 mg Intravenous Once PRN Amin, Ankit Chirag, MD      ? guaiFENesin (MUCINEX) 12 hr tablet 600 mg  600 mg Oral BID Hugelmeyer, Alexis, DO   600 mg at 01/28/22 0909  ? hydrALAZINE (APRESOLINE) injection 10 mg  10 mg Intravenous Q4H PRN Amin, Jeanella Flattery, MD      ? HYDROcodone-acetaminophen (NORCO/VICODIN) 5-325 MG per tablet 1-2 tablet  1-2 tablet Oral Q4H PRN Hugelmeyer, Alexis, DO   2 tablet at 01/26/22 0901  ? ipratropium-albuterol (DUONEB) 0.5-2.5 (3) MG/3ML nebulizer solution 3 mL  3 mL Nebulization Q4H PRN Amin, Ankit Chirag, MD      ? LORazepam (ATIVAN) injection  0.5 mg  0.5 mg Intravenous Q4H PRN Amin, Ankit Chirag, MD      ? MEDLINE mouth rinse  15 mL Mouth Rinse BID Amin, Ankit Chirag, MD   15 mL at 01/28/22 0909  ? morphine (PF) 2 MG/ML injection 1 mg  1 mg Intravenous Q4H PRN Hugelmeyer, Alexis, DO      ? multivitamin with minerals tablet 1 tablet  1 tablet Oral Daily Damita Lack, MD   1 tablet at 01/28/22 0909  ? ondansetron (ZOFRAN) tablet 4 mg  4 mg Oral Q6H PRN Hugelmeyer, Alexis, DO      ? Or  ? ondansetron (ZOFRAN) injection 4 mg  4 mg Intravenous Q6H PRN Hugelmeyer, Alexis, DO      ? risperiDONE (RISPERDAL) tablet 0.5 mg  0.5 mg Oral QHS Amin, Ankit Chirag, MD   0.5 mg at 01/27/22 2218  ? senna-docusate (Senokot-S) tablet 1 tablet  1 tablet Oral QHS PRN Amin, Ankit Chirag, MD      ? traZODone (DESYREL) tablet 50 mg  50 mg Oral QHS PRN Damita Lack, MD   50 mg at 01/21/22 2054  ? vancomycin (VANCOCIN) IVPB 1000 mg/200 mL premix  1,000 mg Intravenous Q12H Elodia Florence., MD 200 mL/hr at 01/28/22 0431 1,000 mg at 01/28/22 0431  ? ? ? ?Discharge Medications: ?Please see discharge summary for a list of discharge medications. ? ?Relevant Imaging Results: ? ?Relevant Lab Results: ? ? ?Additional Information ?SS# [id number] Pfizer covid vaccination x 2. ? ?Milton Sagona, Marjie Skiff, RN ? ? ? ? ?

## 2022-01-28 NOTE — Progress Notes (Signed)
Chaplain received a referral from Palliative Care to provide support to family.  Family was not at bedside when chaplain arrived and Leroy Sea was not alert.  Will attempt see her tomorrow and if not, will call her by phone. ? ?[employer], Bcc ?Pager, [phone] ? ?

## 2022-01-29 DIAGNOSIS — F0280 Dementia in other diseases classified elsewhere without behavioral disturbance: Secondary | ICD-10-CM | POA: Diagnosis not present

## 2022-01-29 DIAGNOSIS — G3109 Other frontotemporal dementia: Secondary | ICD-10-CM | POA: Diagnosis not present

## 2022-01-29 LAB — CREATININE, SERUM
Creatinine, Ser: 0.52 mg/dL — ABNORMAL LOW (ref 0.61–1.24)
GFR, Estimated: >60 mL/min (ref >60)

## 2022-01-29 LAB — VANCOMYCIN, TROUGH: Vancomycin Tr: 15 ug/mL (ref 15–20)

## 2022-01-29 LAB — VANCOMYCIN, PEAK: Vancomycin Pk: 30 ug/mL (ref 30–40)

## 2022-01-29 MED ORDER — VANCOMYCIN HCL 750 MG/150ML IV SOLN
750.0000 mg | Freq: Two times a day (BID) | INTRAVENOUS | Status: DC
  Administered 2022-01-29 – 2022-01-30 (×2): 750 mg via INTRAVENOUS
  Filled 2022-01-29 (×4): qty 150

## 2022-01-29 NOTE — Progress Notes (Signed)
Physical Therapy Treatment ?Patient Details ?Name: Micheal [name] ?MRN: [id number] ?DOB: [birth year] ?Today's Date: 01/29/2022 ? ? ?History of Present Illness Pt is 66 yo male admitted 01/18/22 with sepsis due to PNE and metabolic encephalopathy, acute on chronic subdural hematoma.  Pt with hx including dementia - nonverbal, major depressive disorder, and HTN. ? ?  ?PT Comments  ? ? Pt participated fairly well. He continues to require multimodal cueing (primarily tactile/guidance cues) for mobility-did not follow verbal commands. He is unsteady and at risk for falls when mobilizing. He could benefit from increased mobility if nursing is able to walk with him (will need +2 handhold assistance for safety). Will continue to follow.  ?   ?Recommendations for follow up therapy are one component of a multi-disciplinary discharge planning process, led by the attending physician.  Recommendations may be updated based on patient status, additional functional criteria and insurance authorization. ? ?Follow Up Recommendations ? Long-term institutional care without follow-up therapy ?  ?  ?Assistance Recommended at Discharge Frequent or constant Supervision/Assistance  ?Patient can return home with the following A lot of help with walking and/or transfers;A lot of help with bathing/dressing/bathroom;Assistance with feeding;Direct supervision/assist for medications management ?  ?Equipment Recommendations ? None recommended by PT  ?  ?Recommendations for Other Services   ? ? ?  ?Precautions / Restrictions Precautions ?Precautions: Fall ?Restrictions ?Weight Bearing Restrictions: No  ?  ? ?Mobility ? Bed Mobility ?Overal bed mobility: Needs Assistance ?Bed Mobility: Supine to Sit, Sit to Supine ?  ?  ?Supine to sit: Min assist, HOB elevated ?Sit to supine: Mod assist; +2 safety/equipment ?  ?General bed mobility comments: Increased assistance for sit to supine due to pt a little resistive to returning to bed. Cueing required. ?   ? ?Transfers ?Overall transfer level: Needs assistance ?Equipment used: 2 person hand held assist ?Transfers: Sit to/from Stand ?Sit to Stand: Min assist; +2 safety/equipment ?  ?  ?  ?  ?  ?General transfer comment: Assist to rise, steady, and for descent to bed. Unsteady. Cueing required. ?  ? ?Ambulation/Gait ?Ambulation/Gait assistance: Min assist, +2 safety/equipment ?Gait Distance (Feet): 350 Feet ?Assistive device: 2 person hand held assist ?Gait Pattern/deviations: Narrow base of support, Scissoring ?  ?  ?  ?General Gait Details: Walked mostly with 2 HHA but able to walk with 1 HHA during the last 75 feet of distance. Unsteady/fall risk. Tolerated distance well. ? ? ?Stairs ?  ?  ?  ?  ?  ? ? ?Wheelchair Mobility ?  ? ?Modified Rankin (Stroke Patients Only) ?  ? ? ?  ?Balance Overall balance assessment: Needs assistance ?  ?  ?  ?  ?Standing balance support: Single extremity supported, Bilateral upper extremity supported ?Standing balance-Leahy Scale: Poor ?  ?  ?  ?  ?  ?  ?  ?  ?  ?  ?  ?  ?  ? ?  ?Cognition Arousal/Alertness: Awake/alert ?Behavior During Therapy: Restless ?Overall Cognitive Status: History of cognitive impairments - at baseline ?  ?  ?  ?  ?  ?  ?  ?  ?  ?  ?  ?  ?  ?  ?  ?  ?General Comments: Pt likely at baseline cogntion - hx of dementia, minimal verbalization.  Follows multimodal cueing (primarily tactile/guidance) ?  ?  ? ?  ?Exercises   ? ?  ?General Comments   ?  ?  ? ?Pertinent Vitals/Pain Pain Assessment ?Pain Assessment: Faces ?  Faces Pain Scale: No hurt  ? ? ?Home Living   ?  ?  ?  ?  ?  ?  ?  ?  ?  ?   ?  ?Prior Function    ?  ?  ?   ? ?PT Goals (current goals can now be found in the care plan section) Progress towards PT goals: Progressing toward goals (but limited by cognition) ? ?  ?Frequency ? ? ? Min 2X/week ? ? ? ?  ?PT Plan Current plan remains appropriate  ? ? ?Co-evaluation   ?  ?  ?  ?  ? ?  ?AM-PAC PT "6 Clicks" Mobility   ?Outcome Measure ? Help needed turning  from your back to your side while in a flat bed without using bedrails?: A Lot ?Help needed moving from lying on your back to sitting on the side of a flat bed without using bedrails?: A Lot ?Help needed moving to and from a bed to a chair (including a wheelchair)?: A Lot ?Help needed standing up from a chair using your arms (e.g., wheelchair or bedside chair)?: A Lot ?Help needed to walk in hospital room?: A Lot ?Help needed climbing 3-5 steps with a railing? : A Lot ?6 Click Score: 12 ? ?  ?End of Session Equipment Utilized During Treatment: Gait belt ?Activity Tolerance: Patient tolerated treatment well ?Patient left: in bed;with call bell/phone within reach;with bed alarm set (mittens on/tv on) ?  ?PT Visit Diagnosis: Other abnormalities of gait and mobility (R26.89);Difficulty in walking, not elsewhere classified (R26.2) ?  ? ? ?Time: 8341-9622 ?PT Time Calculation (min) (ACUTE ONLY): 17 min ? ?Charges:  $Gait Training: 8-22 mins          ?          ? ? ? ? ? ?Faye Ramsay, PT ?Acute Rehabilitation  ?Office: [phone] ?Pager: [phone] ? ?  ? ?

## 2022-01-29 NOTE — TOC Progression Note (Signed)
Transition of Care (TOC) - Progression Note  ? ? ?Patient Details  ?Name: Micheal [name] ?MRN: [id number] ?Date of Birth: [birth year] ? ?Transition of Care (TOC) CM/SW Contact  ?Mikella Linsley, Meriam Sprague, RN ?Phone Number: ?01/29/2022, 2:27 PM ? ?Clinical Narrative:    ?2 out of 3 locked SNF facilities have declined pt. Awaiting response from last locked SNF. MD made aware. Pt walked 311ft min assist today with physical therapy. [facility] still able to care for pt at this level of mobility. ? ? ?  ? ? ?

## 2022-01-29 NOTE — Progress Notes (Signed)
?PROGRESS NOTE ? ? ? ?Micheal [name]  [id number] DOB: [birth year] DOA: 01/18/2022 ?PCP: No primary care provider on file.  ?Chief Complaint  ?Patient presents with  ? Altered Mental Status  ? Fever  ?  Pt arrived via EMS from [facility] facility. Staff reported pt has been altered from baseline, pt usually ambulatory.  ? ? ?Brief Narrative:  ?66 year old with history of HTN, major depressive disorder, nonverbal at baseline but usually ambulates brought to the hospital from [facility] facility for change in mental status and fever.  Initially there was concerns of aspiration pneumonia therefore started on IV vancomycin, Maxipime and Flagyl.  CT also showed possible early small bowel obstruction and case was discussed by ED physician with Dr. Dema Severin from general surgery who did not think patient has SBO.  Due to his recurrent ongoing dementia with delirium and readmissions, patient's sister is interested in discussion with palliative care.  Urine culture showed MRSA UTI. ? ?At this time, medically ready for discharge.  [facility] willing for him to return despite decline.  We've faxed him out to local locked SNF's due to concern from family and cooperation of guardianship.  If no acceptance, will likely return [facility].   ?   ? ?Assessment & Plan: ?  ?Principal Problem: ?  Frontotemporal dementia ([place]) ?Active Problems: ?  Sepsis due to pneumonia [facility]) ?  MRSA UTI ?  Hypoglycemia ?  Acute on chronic intracranial subdural hematoma (HCC) ?  History of pulmonary embolus (PE) ?  MDD (major depressive disorder) ?  Protein-calorie malnutrition, severe ?  Goals of care, counseling/discussion ? ? ?Assessment and Plan: ?* Frontotemporal dementia ([place]) ?With delirium, now reportedly close to baseline ?CT head with decrease in size of acute on chronic L subdural hematoma (discussed by prior MD with nsgy, ok for anticoagulation prn) ?Delirium precautions ?Prn ativan ?Scheduled risperdal, cogentin, prn  trazodone ?At home on long acting injectable haldol and cogentin - per palliative care, recommended discussion long term regarding appropriateness and whether another med like risperdal (which would allow d/c cogentin) may be better option - considering risk of falls.  At this point, would recommend considering discontinuing long acting haldol at discharge and considering Risperdal instead.   ? ?Initially planning to discharge back to [facility].  Sister Micheal [name] and the cooperation of guardianship has concerns about him returning.  When I spoke to them on Friday (4/21), main concerns seem to have been his inability to feed himself and the change in his ability to ambulate.  I spoke with [facility] Friday, they noted before he came to hospital, he was Micheal [name] frequent walker, he was getting up and walking without assistance and without any assistive device and was able to feed himself (double portion finger food).  I asked about their ability to help and assist with ambulating, etc and on Friday (4/21) was told essentially there was no 1:1 care.  On Friday, it was reported to me he was feeding himself, but this was not the case over the weekend - he's requiring total assistance here -> per discussion with nursing this has improved today on finger foods.  He walked 150' with mod Micheal [name] with R drifting (with assist of 2 for safety) on 4/21 (staff from [facility] later watched him on video and they thought he'd be ok to return).  At this time, [place] willing for him to come back with the noted concerns (and believes they'll be able to manage him), but family  and cooperation of guardianship still has significant concerns regarding his change in status and need for higher level of care.  I appreciate assistance of TOC with difficult case, they've faxed him out to local locked facilities, will see if anything comes available.     ? ?Sepsis due to pneumonia [facility]) ?CT with findings c/w aspiration pneumonia ?MRSA  PCR positive ?Negative covid, influenza ?Urine strep, legionella ?Blood culture NGx5 ?No sputum cx able to be collected to this point ?Currently on vanc/unasyn.  He's completed 8 days unasyn.  Will continue vanc in house and plan on Discharging with linezolid due to MRSA UTI.  (vanc 4/14 - present).  **Attention to linezolid rx on discharge, this will need to be adjusted for 14 day total abx course (3 more days)** ? ? ?MRSA UTI ?Blood cultures negative, but will treat as occult bacteremia ?Follow echo -> EF 55-60% (MV normal, AV not well visualized) ?Discussed with ID who recommends 14 days abx, complete course with linezolid at discharge (as above, linezolid rx will need to be adjusted at discharge to complete 14 day course). ? ?Hypoglycemia ?Improved, follow on AM BMP's ? ?History of pulmonary embolus (PE) ?Hx PE and right popliteal DVT, Nov 2022 ?No longer on anticoagulation due to fall risk ?LE [name] negative here for DVT ? ?Acute on chronic intracranial subdural hematoma (HCC) ?Noted ? ?Goals of care, counseling/discussion ?Planning for outpatient palliative care ? ? ?DVT prophylaxis: SCD ?Code Status: DNR ?Family Communication: none  ?Disposition:  ? ?Status is: Inpatient ?Remains inpatient appropriate because: pending safe discharge ?  ?Consultants:  ?palliative ? ?Procedures:  ?Echo ?IMPRESSIONS  ? ? ? 1. Left ventricular ejection fraction, by estimation, is 55 to 60%. The  ?left ventricle has normal function. The left ventricle has no regional  ?wall motion abnormalities. Left ventricular diastolic parameters are  ?indeterminate.  ? 2. Right ventricular systolic function is normal. The right ventricular  ?size is normal.  ? 3. The mitral valve is normal in structure. No evidence of mitral valve  ?regurgitation. No evidence of mitral stenosis.  ? 4. The aortic valve was not well visualized. Aortic valve regurgitation  ?is not visualized. No aortic stenosis is present.  ? 5. Aortic dilatation noted. There is  mild dilatation of the aortic root,  ?measuring 39 mm.  ? 6. The inferior vena cava is normal in size with greater than 50%  ?respiratory variability, suggesting right atrial pressure of 3 mmHg.  ? ?LE [name] ?Summary:  ?RIGHT:  ?- There is no evidence of deep vein thrombosis in the lower extremity.  ?   ?- No cystic structure found in the popliteal fossa.  ?   ?LEFT:  ?- There is no evidence of deep vein thrombosis in the lower extremity.  ?   ?- No cystic structure found in the popliteal fossa. ? ?Antimicrobials:  ?Anti-infectives (From admission, onward)  ? ? Start     Dose/Rate Route Frequency Ordered Stop  ? 01/26/22 1600  vancomycin (VANCOCIN) IVPB 1000 mg/200 mL premix       ? 1,000 mg ?200 mL/hr over 60 Minutes Intravenous Every 12 hours 01/26/22 1516    ? 01/26/22 1545  linezolid (ZYVOX) tablet 600 mg  Status:  Discontinued       ? 600 mg Oral Every 12 hours 01/26/22 1457 01/26/22 1506  ? 01/25/22 0000  linezolid (ZYVOX) 600 MG tablet       ? 600 mg Oral 2 times daily 01/25/22 1420 01/31/22 2359  ?  01/21/22 1200  Ampicillin-Sulbactam (UNASYN) 3 g in sodium chloride 0.9 % 100 mL IVPB       ? 3 g ?200 mL/hr over 30 Minutes Intravenous Every 6 hours 01/21/22 1051 01/25/22 1750  ? 01/19/22 1000  vancomycin (VANCOCIN) IVPB 1000 mg/200 mL premix       ? 1,000 mg ?200 mL/hr over 60 Minutes Intravenous Every 12 hours 01/19/22 0152 01/25/22 2317  ? 01/19/22 0800  metroNIDAZOLE (FLAGYL) IVPB 500 mg  Status:  Discontinued       ? 500 mg ?100 mL/hr over 60 Minutes Intravenous Every 12 hours 01/19/22 0158 01/21/22 1050  ? 01/19/22 0400  ceFEPIme (MAXIPIME) 2 g in sodium chloride 0.9 % 100 mL IVPB  Status:  Discontinued       ? 2 g ?200 mL/hr over 30 Minutes Intravenous Every 8 hours 01/18/22 2346 01/21/22 1050  ? 01/18/22 1945  ceFEPIme (MAXIPIME) 2 g in sodium chloride 0.9 % 100 mL IVPB       ? 2 g ?200 mL/hr over 30 Minutes Intravenous  Once 01/18/22 1939 01/18/22 2122  ? 01/18/22 1945  metroNIDAZOLE (FLAGYL) IVPB 500 mg        ? 500 mg ?100 mL/hr over 60 Minutes Intravenous  Once 01/18/22 1939 01/18/22 2122  ? 01/18/22 1945  vancomycin (VANCOCIN) IVPB 1000 mg/200 mL premix  Status:  Discontinued       ? 1,000 mg ?200 mL/hr

## 2022-01-29 NOTE — Care Management Important Message (Signed)
Important Message ? ?Patient Details IM Letter placed in Patients room. ?Name: Micheal [name] ?MRN: [id number] ?Date of Birth: Jun 17, 1956 ? ? ?Medicare Important Message Given:  Yes ? ? ? ? ?Caren Macadam ?01/29/2022, 12:47 PM ?

## 2022-01-29 NOTE — Progress Notes (Signed)
Pharmacy Antibiotic Note ? ?Micheal [name] is a 66 y.o. male with dementia admitted on 01/18/2022 with sepsis due to PNA.  Found to have MRSA in urine.  Pharmacy has been consulted for vancomycin dosing. Per ID note 4/21, planning 14 days of therapy since treating as occult bacteremia. ? ?Day #11 Vancomycin  ?- Afebrile ?- WBC 6.2 on 4/23 ?- SCr 0.52, stable ? ?Plan: ?Check vancomycin peak and trough today to calculate AUC (goal 400-550 ?Plan is for transition to linezolid at discharge to complete therapy, last day would be Friday 4/28. ? ?Height: 6' (182.9 cm) ?Weight: 63.2 kg (139 lb 5.3 oz) ?IBW/kg (Calculated) : 77.6 ? ?Temp (24hrs), Avg:97.8 ?F (36.6 ?C), Min:97.5 ?F (36.4 ?C), Max:98 ?F (36.7 ?C) ? ?Recent Labs  ?Lab 01/23/22 ?8242 01/24/22 ?3536 01/25/22 ?1443 01/26/22 ?1540 01/27/22 ?0867  ?WBC 5.8 4.9 6.4 6.3 6.2  ?CREATININE 0.59* 0.62 0.61 0.63 0.60*  ?  ?Estimated Creatinine Clearance: 82.3 mL/min (A) (by C-G formula based on SCr of 0.6 mg/dL (L)).   ? ?No Known Allergies ? ?Antimicrobials this admission: ?4/14 Cefepime >> 4/17 ?4/14 Vancomycin >> 4/21, resume 4/22 ?4/17 Unasyn>> 4/21 ?4/14 Metronidazole >>4/17 ? ?Dose adjustments this admission: ?4/25 Vanc peak at 0745 = 30, Vanc trough at 1530 = ___ ? ?Microbiology results: ?4/14 Resp panel: Neg ?4/14 UCx: >100,000 MRSA ?4/14 Bcx x2: NGF ?4/15 mrsa pcr (+) ? ?Thank you for allowing pharmacy to be a part of this patient?s care. ? ?Loralee Pacas, PharmD, BCPS ?Pharmacy: [phone] ?01/29/2022 8:09 AM ? ?

## 2022-01-29 NOTE — Progress Notes (Signed)
Pharmacy: Re- vancomycin ? ?Patient's a 66 y.o M currently on vancomycin for occult bacteremia. ? ?Vancomycin peak level = 30 ?Vancomycin trough level = 15 ?Calc AUC 577 (goal 400-550) ? ?Plan: ?- adjust vancomycin dose to 750 mg IV q12h ? ? ?Dorna Leitz, PharmD, BCPS ?01/29/2022 5:28 PM ? ?

## 2022-01-30 DIAGNOSIS — G3109 Other frontotemporal dementia: Secondary | ICD-10-CM | POA: Diagnosis not present

## 2022-01-30 DIAGNOSIS — F0280 Dementia in other diseases classified elsewhere without behavioral disturbance: Secondary | ICD-10-CM | POA: Diagnosis not present

## 2022-01-30 LAB — BASIC METABOLIC PANEL
Anion gap: 5 (ref 5–15)
BUN: 15 mg/dL (ref 8–23)
CO2: 31 mmol/L (ref 22–32)
Calcium: 8.4 mg/dL — ABNORMAL LOW (ref 8.9–10.3)
Chloride: 106 mmol/L (ref 98–111)
Creatinine, Ser: 0.6 mg/dL — ABNORMAL LOW (ref 0.61–1.24)
GFR, Estimated: >60 mL/min (ref >60)
Glucose, Bld: 92 mg/dL (ref 70–99)
Potassium: 4.1 mmol/L (ref 3.5–5.1)
Sodium: 142 mmol/L (ref 135–145)

## 2022-01-30 LAB — MAGNESIUM: Magnesium: 2.5 mg/dL — ABNORMAL HIGH (ref 1.7–2.4)

## 2022-01-30 MED ORDER — LINEZOLID 600 MG PO TABS
600.0000 mg | ORAL_TABLET | Freq: Two times a day (BID) | ORAL | 0 refills | Status: AC
Start: 2022-01-31 — End: 2022-02-02

## 2022-01-30 NOTE — Plan of Care (Signed)
Patient ID: Micheal [name], male   DOB: Aug 17, 1956, 66 y.o.   MRN: [id number] ? ?Problem: Education: ?Goal: Knowledge of General Education information will improve ?Description: Including pain rating scale, medication(s)/side effects and non-pharmacologic comfort measures ?Outcome: Adequate for Discharge ?  ?Problem: Health Behavior/Discharge Planning: ?Goal: Ability to manage health-related needs will improve ?Outcome: Adequate for Discharge ?  ?Problem: Clinical Measurements: ?Goal: Ability to maintain clinical measurements within normal limits will improve ?Outcome: Adequate for Discharge ?Goal: Will remain free from infection ?Outcome: Adequate for Discharge ?Goal: Diagnostic test results will improve ?Outcome: Adequate for Discharge ?Goal: Respiratory complications will improve ?Outcome: Adequate for Discharge ?Goal: Cardiovascular complication will be avoided ?Outcome: Adequate for Discharge ?  ?Problem: Activity: ?Goal: Risk for activity intolerance will decrease ?Outcome: Adequate for Discharge ?  ?Problem: Nutrition: ?Goal: Adequate nutrition will be maintained ?Outcome: Adequate for Discharge ?  ?Problem: Coping: ?Goal: Level of anxiety will decrease ?Outcome: Adequate for Discharge ?  ?Problem: Elimination: ?Goal: Will not experience complications related to bowel motility ?Outcome: Adequate for Discharge ?  ?Problem: Pain Managment: ?Goal: General experience of comfort will improve ?Outcome: Adequate for Discharge ?  ?Problem: Safety: ?Goal: Ability to remain free from injury will improve ?Outcome: Adequate for Discharge ?  ?Problem: Skin Integrity: ?Goal: Risk for impaired skin integrity will decrease ?Outcome: Adequate for Discharge ?  ? ? ?Lidia Collum, RN ? ?

## 2022-01-30 NOTE — [place] FL2 (Signed)
?[place] MEDICAID FL2 LEVEL OF CARE SCREENING TOOL  ?  ? ?IDENTIFICATION  ?Patient Name: ?Micheal [name] Birthdate: 1956/06/06 Sex: male Admission Date (Current Location): ?01/18/2022  ?[place] and [place] Number: ? [place] ?  Facility and Address:  ?[facility],  [address] [place], [place] ?     Provider Number: ?[id number]  ?Attending Physician Name and Address:  ?Darliss Cheney, MD ? Relative Name and Phone Number:  ?  ?   ?Current Level of Care: ?Hospital Recommended Level of Care: ?Other (Comment) (Memory care/secured unit) Prior Approval Number: ?  ? ?Date Approved/Denied: ?  PASRR Number: ?  ? ?Discharge Plan: ?Other (Comment) (Memory care/secured unit) ?  ? ?Current Diagnoses: ?Patient Active Problem List  ? Diagnosis Date Noted  ? MRSA UTI 01/25/2022  ? Hypoglycemia 01/25/2022  ? Frontotemporal dementia ([place]) 01/23/2022  ? Acute on chronic intracranial subdural hematoma (HCC) 01/23/2022  ? History of pulmonary embolus (PE) 01/23/2022  ? MDD (major depressive disorder) 01/23/2022  ? Goals of care, counseling/discussion 01/23/2022  ? Protein-calorie malnutrition, severe 01/21/2022  ? Sepsis due to pneumonia ([place]) 01/18/2022  ? Unstable gait 08/06/2021  ? Weakness   ? 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 Dehydration with hypernatremia 03/01/2020  ? Essential hypertension 03/01/2020  ? ? ?Orientation RESPIRATION BLADDER Height & Weight   ?  ? (Non-verbal/not oriented) ? Normal Incontinent Weight: 63.2 kg ?Height:  6' (182.9 cm)  ?BEHAVIORAL SYMPTOMS/MOOD NEUROLOGICAL BOWEL NUTRITION STATUS  ?    Incontinent Diet (Regular diet/ finger foods/ double portion)  ?AMBULATORY STATUS COMMUNICATION OF NEEDS Skin   ?Limited Assist Does not communicate Bruising (scattered redness to sacrum) ?  ?  ?  ?    ?     ?     ? ? ?Personal Care Assistance Level of Assistance  ?Bathing, Feeding, Dressing Bathing Assistance: Limited assistance ?Feeding assistance: Limited  assistance ?Dressing Assistance: Limited assistance ?   ? ?Functional Limitations Info  ?Sight, Hearing, Speech (Unknown, he is non-verbal)   ?  ?   ? ? ?SPECIAL CARE FACTORS FREQUENCY  ?PT (By licensed PT), OT (By licensed OT)   ?  ?PT Frequency: 3 x weekly ?OT Frequency: 3 x weekly ?  ?  ?  ?   ? ? ?Contractures Contractures Info: Not present  ? ? ?Additional Factors Info  ?Code Status, Allergies Code Status Info: DNR ?Allergies Info: None ?  ?  ?  ?   ? ?Current Medications (01/30/2022):  This is the current hospital active medication list ?Current Facility-Administered Medications  ?Medication Dose Route Frequency Provider Last Rate Last Admin  ? acetaminophen (TYLENOL) tablet 650 mg  650 mg Oral Q6H PRN Hugelmeyer, Alexis, DO      ? Or  ? acetaminophen (TYLENOL) suppository 650 mg  650 mg Rectal Q6H PRN Hugelmeyer, Alexis, DO      ? benztropine (COGENTIN) tablet 1 mg  1 mg Oral BID Amin, Ankit Chirag, MD   1 mg at 01/30/22 1043  ? dextromethorphan-guaiFENesin (MUCINEX DM) 30-600 MG per 12 hr tablet 1 tablet  1 tablet Oral BID PRN Amin, Ankit Chirag, MD      ? feeding supplement (BOOST / RESOURCE BREEZE) liquid 1 Container  1 Container Oral BID WC Oswald Hillock, MD   1 Container at 01/30/22 [id number]  ? feeding supplement (ENSURE ENLIVE / ENSURE PLUS) liquid 237 mL  237 mL Oral BID BM Oswald Hillock, MD   237 mL at 01/30/22  1044  ? glucagon (human recombinant) (GLUCAGEN) injection 1 mg  1 mg Intravenous Once PRN Amin, Ankit Chirag, MD      ? guaiFENesin (MUCINEX) 12 hr tablet 600 mg  600 mg Oral BID Hugelmeyer, Alexis, DO   600 mg at 01/30/22 1044  ? hydrALAZINE (APRESOLINE) injection 10 mg  10 mg Intravenous Q4H PRN Amin, Jeanella Flattery, MD      ? HYDROcodone-acetaminophen (NORCO/VICODIN) 5-325 MG per tablet 1-2 tablet  1-2 tablet Oral Q4H PRN Hugelmeyer, Alexis, DO   2 tablet at 01/26/22 0901  ? ipratropium-albuterol (DUONEB) 0.5-2.5 (3) MG/3ML nebulizer solution 3 mL  3 mL Nebulization Q4H PRN Amin, Ankit Chirag, MD       ? LORazepam (ATIVAN) injection 0.5 mg  0.5 mg Intravenous Q4H PRN Amin, Ankit Chirag, MD   0.5 mg at 01/30/22 0050  ? MEDLINE mouth rinse  15 mL Mouth Rinse BID Amin, Ankit Chirag, MD   15 mL at 01/30/22 1044  ? morphine (PF) 2 MG/ML injection 1 mg  1 mg Intravenous Q4H PRN Hugelmeyer, Alexis, DO      ? multivitamin with minerals tablet 1 tablet  1 tablet Oral Daily Amin, Jeanella Flattery, MD   1 tablet at 01/30/22 1044  ? ondansetron (ZOFRAN) tablet 4 mg  4 mg Oral Q6H PRN Hugelmeyer, Alexis, DO      ? Or  ? ondansetron (ZOFRAN) injection 4 mg  4 mg Intravenous Q6H PRN Hugelmeyer, Alexis, DO      ? risperiDONE (RISPERDAL) tablet 0.5 mg  0.5 mg Oral QHS Amin, Ankit Chirag, MD   0.5 mg at 01/29/22 2137  ? senna-docusate (Senokot-S) tablet 1 tablet  1 tablet Oral QHS PRN Amin, Ankit Chirag, MD      ? traZODone (DESYREL) tablet 50 mg  50 mg Oral QHS PRN Damita Lack, MD   50 mg at 01/21/22 2054  ? vancomycin (VANCOREADY) IVPB 750 mg/150 mL  750 mg Intravenous Q12H Lynelle Doctor, RPH 150 mL/hr at 01/30/22 0618 750 mg at 01/30/22 [id number]  ? ? ? ?Discharge Medications: ?Please see discharge summary for a list of discharge medications. ? ?Relevant Imaging Results: ? ?Relevant Lab Results: ? ? ?Additional Information ?SS# [id number] Pfizer covid vaccination x 2. ? ?Aran Menning, Marjie Skiff, RN ? ? ? ? ?

## 2022-01-30 NOTE — Progress Notes (Signed)
Patient ID: Micheal [name], male   DOB: [birth year], 66 y.o.   MRN: [id number] ?Attempted to call report to [facility] x 3. Left detailed voice message. Awaiting return call. ? ?Lidia Collum, RN ? ?

## 2022-01-30 NOTE — TOC Progression Note (Addendum)
Transition of Care (TOC) - Progression Note  ? ? ?Patient Details  ?Name: Micheal [name] ?MRN: [id number] ?Date of Birth: [birth year] ? ?Transition of Care (TOC) CM/SW Contact  ?Zarra Geffert, Marjie Skiff, RN ?Phone Number: ?01/30/2022, 11:44 AM ? ?Clinical Narrative:    ?Spoke with sister Micheal [name] via phone along with [facility] supervisor Micheal [name] to give update on disposition planning. 2 out of the 3 area locked SNFs declined pt and the 3rd has still not responded. Micheal [name] was given the Medicare star rating of the remaining facility and said "do not send him there". It was clarified that she understood he would then need to go back to [facility]. She agreed to this. MD informed. ? ? ?Addendum: [id number] FL2 and DC summary faxed to [place] [facility]. Crystal looked over paperwork and is ready for pt to come back. [facility] called for transport. Yellow DNR on the front of the chart. RN to call report to [phone]. ?  ?

## 2022-01-30 NOTE — Discharge Summary (Signed)
PatientPhysician Discharge Summary  ?Micheal [name] OLM:[id number] DOB: [birth year] DOA: 01/18/2022 ? ?PCP: No primary care provider on file. ? ?Admit date: 01/18/2022 ?Discharge date: 01/30/2022 ?30 Day Unplanned Readmission Risk Score   ? ?Flowsheet Row ED to Hosp-Admission (Current) from 01/18/2022 in [place] [facility] EAST ONCOLOGY  ?30 Day Unplanned Readmission Risk Score (%) 26.26 Filed at 01/30/2022 0801  ? ?  ? ? This score is the patient's risk of an unplanned readmission within 30 days of being discharged (0 -100%). The score is based on dignosis, age, lab data, medications, orders, and past utilization.   ?Low:  0-14.9   Medium: 15-21.9   High: 22-29.9   Extreme: 30 and above ? ?  ? ?  ? ? ? ?Admitted From: Long-term care facility/memory care unit/ [facility] facility ?Disposition:  Long-term care facility/memory care unit/ [facility] facility ? ?Recommendations for Outpatient Follow-up:  ?Follow up with PCP in 1-2 weeks ?Please obtain BMP/CBC in one week ?Please follow up with your PCP on the following pending results: ?Unresulted Labs (From admission, onward)  ? ? None  ? ?  ?  ? ? ?Home Health: None ?Equipment/Devices: None ? ?Discharge Condition: Stable ?CODE STATUS: DNR ?Diet recommendation: Cardiac ? ?Subjective: Seen and examined.  Patient alert but not oriented to time severe dementia.  Appears comfortable and stable. ? ?Brief/Interim Summary: 66 year old with history of HTN, major depressive disorder, nonverbal at baseline but usually ambulates brought to the hospital from [facility] facility for change in mental status and fever.  Initially there was concerns of aspiration pneumonia therefore started on IV vancomycin, Maxipime and Flagyl.  CT also showed possible early small bowel obstruction and case was discussed by ED physician with Dr. Cliffton Asters from general surgery who did not think patient has SBO.  Due to his recurrent ongoing dementia with delirium and readmissions, patient's sister  requested palliative care consultation.  Urine culture showed MRSA UTI.  Patient was continued on IV vancomycin.  He has received 12 days of that and now he is being transition to oral linezolid for 2 more days to complete 14 days.  Detailed hospitalization as below. ?  ?* Frontotemporal dementia (HCC) ?With delirium, now reportedly close to baseline ?CT head with decrease in size of acute on chronic L subdural hematoma (discussed by prior MD with nsgy, ok for anticoagulation prn) ?Delirium precautions ?Prn ativan ?Scheduled risperdal, cogentin, prn trazodone ?At home on long acting injectable haldol and cogentin - per palliative care, recommended discussion long term regarding appropriateness and whether another med like risperdal (which would allow d/c cogentin) may be better option - considering risk of falls.  At this point, would recommend considering discontinuing long acting haldol at discharge and considering Risperdal instead.   ?  ?Initially planning to discharge back to [facility].  Sister [name] and the cooperation of guardianship had concerns about him returning.  Per previous hospitalist based on his discussion with the family, main concerns seem to have been his inability to feed himself and the change in his ability to ambulate.  He also spoke with [facility] Friday, they noted before he came to hospital, he was a frequent walker, he was getting up and walking without assistance and without any assistive device and was able to feed himself (double portion finger food).  He asked about their ability to help and assist with ambulating, etc and on Friday (4/21) was told essentially there was no 1:1 care.  On Friday, it was reported that he  was feeding himself, but this was not the case over the weekend - he's requiring total assistance here - At this time, [facility] willing for him to come back with the noted concerns (and believes they'll be able to manage him), but family and  cooperation of guardianship still had significant concerns regarding his change in status and need for higher level of care.  Per their request, referrals were sent to 3 different memory care units and unfortunately 2 of them had declined him and the 1 that has noticed more advanced is on the low side.  Over TOC discussed with the sister today and informed about floor at another facility and based on that, sister has now stated that she would not want the patient to go to that lower ranking facility and instead she is now agreeable to discharge [facility].  Based on this, patient is being discharged. ?  ?Sepsis due to pneumonia [facility](HCC) ?CT with findings c/w aspiration pneumonia ?MRSA PCR positive ?Negative covid, influenza ?Urine strep, legionella ?Blood culture NGx5 ?No sputum cx able to be collected to this point ?.  He's completed 8 days unasyn.  ?  ?History of pulmonary embolus (PE) ?Hx PE and right popliteal DVT, Nov 2022 ?No longer on anticoagulation due to fall risk ?LE US negative here for DVT ?  ?Acute on chronic intracranial subdural hematoma (HCC) ?Noted ?  ?Goals of care, counseling/discussion ?Planning for outpatient palliative care ? ?At this time, medically ready for discharge.  [facility] willing for him to return despite decline.  We've faxed him out to local locked SNF's due to concern from family and cooperation of guardianship.  If no acceptance, will likely [facility].   ? ?Discharge plan was discussed with patient and/or family member and they verbalized understanding and agreed with it.  ?Discharge Diagnoses:  ?Principal Problem: ?  Frontotemporal dementia (HCC) ?Active Problems: ?  Sepsis due to pneumonia [facility](HCC) ?  MRSA UTI ?  Hypoglycemia ?  Acute on chronic intracranial subdural hematoma (HCC) ?  History of pulmonary embolus (PE) ?  MDD (major depressive disorder) ?  Protein-calorie malnutrition, severe ?  Goals of care,  counseling/discussion ? ? ? ?Discharge Instructions ? ?Discharge Instructions   ? ? Call MD for:  difficulty breathing, headache or visual disturbances   Complete by: As directed ?  ? Call MD for:  extreme fatigue   Complete by: As directed ?  ? Call MD for:  hives   Complete by: As directed ?  ? Call MD for:  persistant dizziness or light-headedness   Complete by: As directed ?  ? Call MD for:  persistant nausea and vomiting   Complete by: As directed ?  ? Call MD for:  redness, tenderness, or signs of infection (pain, swelling, redness, odor or green/yellow discharge around incision site)   Complete by: As directed ?  ? Call MD for:  severe uncontrolled pain   Complete by: As directed ?  ? Call MD for:  temperature >100.4   Complete by: As directed ?  ? Diet - low sodium heart healthy   Complete by: As directed ?  ? Discharge instructions   Complete by: As directed ?  ? You were seen for pneumonia. ? ?You also had MRSA in your urine.   ? ?We've treated you with antibiotics for your urine infection and pneumonia.  We've given you an extended course for your UTI at discharge due to concern for  possible occult bacteremia with MRSA UTI.  This should be followed outpatient. ? ?Please discuss your antipsychotics with your primary prescribers outpatient.  Could consider transition to risperdal instead of haldol (palliative care recommended readdressing this with outpatient provider).  Transition off of haldol could allow stopping cogentin which could contribute to falls and delirium as well. ? ?Return for new, recurrent, or worsening symptoms. ? ?Please ask your PCP to request records from this hospitalization so they know what was done and what the next steps will be.  ? Increase activity slowly   Complete by: As directed ?  ? No wound care   Complete by: As directed ?  ? ?  ? ?Allergies as of 01/30/2022   ?No Known Allergies ?  ? ?  ?Medication List  ?  ? ?STOP taking these medications   ? ?clopidogrel 75 MG  tablet ?Commonly known as: PLAVIX ?  ? ?  ? ?TAKE these medications   ? ?acetaminophen 500 MG tablet ?Commonly known as: TYLENOL ?Take 500 mg by mouth every 6 (six) hours as needed for headache, fever or mild pain. ?  ?acetaminophen 325 MG tablet ?Commonl

## 2022-01-30 NOTE — Progress Notes (Signed)
Patient left via PT[facility], IV removed before coming on to shift. Mits removed. Primofit removed. VS taken before departure. AVS completed. Patient tolerated transfer to stretcher well. Paperwork give to [employer]. [facility] facility called to inform of arrival at 2058. Voicemail box is full, unsuccessful attempt. ?

## 2022-03-09 ENCOUNTER — Encounter (HOSPITAL_COMMUNITY): Payer: Self-pay | Admitting: Internal Medicine

## 2022-03-09 ENCOUNTER — Inpatient Hospital Stay (HOSPITAL_COMMUNITY)
Admission: EM | Admit: 2022-03-09 | Discharge: 2022-03-13 | DRG: 640 | Disposition: A | Discharge status: Nursing Home (Includes ICF) | Payer: Medicare Other | Source: Skilled Nursing Facility | Attending: Internal Medicine | Admitting: Internal Medicine

## 2022-03-09 ENCOUNTER — Emergency Department (HOSPITAL_COMMUNITY): Payer: Medicare Other

## 2022-03-09 ENCOUNTER — Observation Stay (HOSPITAL_COMMUNITY): Payer: Medicare Other

## 2022-03-09 DIAGNOSIS — G9340 Encephalopathy, unspecified: Secondary | ICD-10-CM

## 2022-03-09 DIAGNOSIS — D638 Anemia in other chronic diseases classified elsewhere: Secondary | ICD-10-CM | POA: Diagnosis present

## 2022-03-09 DIAGNOSIS — I10 Essential (primary) hypertension: Secondary | ICD-10-CM | POA: Diagnosis present

## 2022-03-09 DIAGNOSIS — G9341 Metabolic encephalopathy: Secondary | ICD-10-CM | POA: Diagnosis present

## 2022-03-09 DIAGNOSIS — G3109 Other frontotemporal dementia: Secondary | ICD-10-CM | POA: Diagnosis present

## 2022-03-09 DIAGNOSIS — I6203 Nontraumatic chronic subdural hemorrhage: Secondary | ICD-10-CM | POA: Diagnosis present

## 2022-03-09 DIAGNOSIS — F0280 Dementia in other diseases classified elsewhere without behavioral disturbance: Secondary | ICD-10-CM | POA: Diagnosis present

## 2022-03-09 DIAGNOSIS — E860 Dehydration: Principal | ICD-10-CM | POA: Diagnosis present

## 2022-03-09 DIAGNOSIS — Z66 Do not resuscitate: Secondary | ICD-10-CM | POA: Diagnosis present

## 2022-03-09 DIAGNOSIS — Z820 Family history of epilepsy and other diseases of the nervous system: Secondary | ICD-10-CM

## 2022-03-09 DIAGNOSIS — E538 Deficiency of other specified B group vitamins: Secondary | ICD-10-CM | POA: Diagnosis present

## 2022-03-09 DIAGNOSIS — R4182 Altered mental status, unspecified: Secondary | ICD-10-CM | POA: Diagnosis not present

## 2022-03-09 DIAGNOSIS — R451 Restlessness and agitation: Principal | ICD-10-CM

## 2022-03-09 DIAGNOSIS — I6201 Nontraumatic acute subdural hemorrhage: Secondary | ICD-10-CM

## 2022-03-09 DIAGNOSIS — D696 Thrombocytopenia, unspecified: Secondary | ICD-10-CM | POA: Diagnosis present

## 2022-03-09 HISTORY — DX: Nontraumatic chronic subdural hemorrhage: I62.03

## 2022-03-09 LAB — CBC WITH DIFFERENTIAL/PLATELET
Abs Immature Granulocytes: 0.02 10*3/uL (ref 0.00–0.07)
Basophils Absolute: 0 10*3/uL (ref 0.0–0.1)
Basophils Relative: 1 %
Eosinophils Absolute: 0.1 10*3/uL (ref 0.0–0.5)
Eosinophils Relative: 2 %
HCT: 38.3 % — ABNORMAL LOW (ref 39.0–52.0)
Hemoglobin: 12.5 g/dL — ABNORMAL LOW (ref 13.0–17.0)
Immature Granulocytes: 0 %
Lymphocytes Relative: 27 %
Lymphs Abs: 1.6 10*3/uL (ref 0.7–4.0)
MCH: 32.9 pg (ref 26.0–34.0)
MCHC: 32.6 g/dL (ref 30.0–36.0)
MCV: 100.8 fL — ABNORMAL HIGH (ref 80.0–100.0)
Monocytes Absolute: 0.5 10*3/uL (ref 0.1–1.0)
Monocytes Relative: 9 %
Neutro Abs: 3.6 10*3/uL (ref 1.7–7.7)
Neutrophils Relative %: 61 %
Platelets: 146 10*3/uL — ABNORMAL LOW (ref 150–400)
RBC: 3.8 MIL/uL — ABNORMAL LOW (ref 4.22–5.81)
RDW: 13.5 % (ref 11.5–15.5)
WBC: 5.9 10*3/uL (ref 4.0–10.5)
nRBC: 0 % (ref 0.0–0.2)

## 2022-03-09 LAB — COMPREHENSIVE METABOLIC PANEL
ALT: 16 U/L (ref 0–44)
AST: 18 U/L (ref 15–41)
Albumin: 3.6 g/dL (ref 3.5–5.0)
Alkaline Phosphatase: 57 U/L (ref 38–126)
Anion gap: 4 — ABNORMAL LOW (ref 5–15)
BUN: 20 mg/dL (ref 8–23)
CO2: 31 mmol/L (ref 22–32)
Calcium: 8.9 mg/dL (ref 8.9–10.3)
Chloride: 109 mmol/L (ref 98–111)
Creatinine, Ser: 0.68 mg/dL (ref 0.61–1.24)
GFR, Estimated: >60 mL/min (ref >60)
Glucose, Bld: 90 mg/dL (ref 70–99)
Potassium: 3.9 mmol/L (ref 3.5–5.1)
Sodium: 144 mmol/L (ref 135–145)
Total Bilirubin: 0.8 mg/dL (ref 0.3–1.2)
Total Protein: 5.9 g/dL — ABNORMAL LOW (ref 6.5–8.1)

## 2022-03-09 LAB — CBG MONITORING, ED: Glucose-Capillary: 82 mg/dL (ref 70–99)

## 2022-03-09 LAB — URINALYSIS, ROUTINE W REFLEX MICROSCOPIC
Bilirubin Urine: NEGATIVE
Glucose, UA: NEGATIVE mg/dL
Hgb urine dipstick: NEGATIVE
Ketones, ur: NEGATIVE mg/dL
Leukocytes,Ua: NEGATIVE
Nitrite: NEGATIVE
Protein, ur: NEGATIVE mg/dL
Specific Gravity, Urine: 1.016 (ref 1.005–1.030)
pH: 6 (ref 5.0–8.0)

## 2022-03-09 LAB — TSH: TSH: 1.302 u[IU]/mL (ref 0.350–4.500)

## 2022-03-09 LAB — MAGNESIUM: Magnesium: 2.3 mg/dL (ref 1.7–2.4)

## 2022-03-09 LAB — ETHANOL: Alcohol, Ethyl (B): <10 mg/dL (ref <10)

## 2022-03-09 LAB — AMMONIA: Ammonia: 13 umol/L (ref 9–35)

## 2022-03-09 LAB — RAPID URINE DRUG SCREEN, HOSP PERFORMED
Amphetamines: NOT DETECTED
Barbiturates: NOT DETECTED
Benzodiazepines: NOT DETECTED
Cocaine: NOT DETECTED
Opiates: NOT DETECTED
Tetrahydrocannabinol: NOT DETECTED

## 2022-03-09 LAB — LACTIC ACID, PLASMA: Lactic Acid, Venous: 1.2 mmol/L (ref 0.5–1.9)

## 2022-03-09 MED ORDER — LACTATED RINGERS IV SOLN: INTRAVENOUS | Status: AC

## 2022-03-09 MED ORDER — BENZTROPINE MESYLATE 0.5 MG PO TABS
1.0000 mg | ORAL_TABLET | Freq: Two times a day (BID) | ORAL | Status: DC
  Administered 2022-03-10 – 2022-03-13 (×7): 1 mg via ORAL
  Filled 2022-03-09 (×7): qty 2

## 2022-03-09 MED ORDER — ACETAMINOPHEN 325 MG PO TABS: 650.0000 mg | ORAL_TABLET | Freq: Four times a day (QID) | ORAL | Status: DC | PRN

## 2022-03-09 MED ORDER — ACETAMINOPHEN 650 MG RE SUPP: 650.0000 mg | Freq: Four times a day (QID) | RECTAL | Status: DC | PRN
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
## 2022-03-09 NOTE — ED Notes (Signed)
Pt has = strong grip strength

## 2022-03-09 NOTE — Progress Notes (Signed)
Called in regards to this patients head CT which showed a known minimal left chronic SDH which has decreased in size since his last scan and his shift has also decreased. I do not think this is the source of his confusion. This is not surgical. Would recommend hospitalist admission for further workup of confusion.

## 2022-03-10 ENCOUNTER — Observation Stay (HOSPITAL_COMMUNITY): Payer: Medicare Other

## 2022-03-10 DIAGNOSIS — I6201 Nontraumatic acute subdural hemorrhage: Secondary | ICD-10-CM

## 2022-03-10 DIAGNOSIS — Z7189 Other specified counseling: Secondary | ICD-10-CM | POA: Diagnosis not present

## 2022-03-10 DIAGNOSIS — I10 Essential (primary) hypertension: Secondary | ICD-10-CM | POA: Diagnosis present

## 2022-03-10 DIAGNOSIS — Z66 Do not resuscitate: Secondary | ICD-10-CM | POA: Diagnosis present

## 2022-03-10 DIAGNOSIS — R4182 Altered mental status, unspecified: Secondary | ICD-10-CM | POA: Diagnosis present

## 2022-03-10 DIAGNOSIS — Z820 Family history of epilepsy and other diseases of the nervous system: Secondary | ICD-10-CM | POA: Diagnosis not present

## 2022-03-10 DIAGNOSIS — G3109 Other frontotemporal dementia: Secondary | ICD-10-CM | POA: Diagnosis present

## 2022-03-10 DIAGNOSIS — D638 Anemia in other chronic diseases classified elsewhere: Secondary | ICD-10-CM | POA: Diagnosis present

## 2022-03-10 DIAGNOSIS — E860 Dehydration: Secondary | ICD-10-CM | POA: Diagnosis present

## 2022-03-10 DIAGNOSIS — E538 Deficiency of other specified B group vitamins: Secondary | ICD-10-CM | POA: Diagnosis present

## 2022-03-10 DIAGNOSIS — I6203 Nontraumatic chronic subdural hemorrhage: Secondary | ICD-10-CM | POA: Diagnosis present

## 2022-03-10 DIAGNOSIS — G9340 Encephalopathy, unspecified: Secondary | ICD-10-CM | POA: Diagnosis not present

## 2022-03-10 DIAGNOSIS — F0280 Dementia in other diseases classified elsewhere without behavioral disturbance: Secondary | ICD-10-CM | POA: Diagnosis present

## 2022-03-10 DIAGNOSIS — F03918 Unspecified dementia, unspecified severity, with other behavioral disturbance: Secondary | ICD-10-CM | POA: Diagnosis not present

## 2022-03-10 DIAGNOSIS — D696 Thrombocytopenia, unspecified: Secondary | ICD-10-CM | POA: Diagnosis present

## 2022-03-10 DIAGNOSIS — G9341 Metabolic encephalopathy: Secondary | ICD-10-CM | POA: Diagnosis present

## 2022-03-10 LAB — COMPREHENSIVE METABOLIC PANEL
ALT: 14 U/L (ref 0–44)
AST: 18 U/L (ref 15–41)
Albumin: 3.3 g/dL — ABNORMAL LOW (ref 3.5–5.0)
Alkaline Phosphatase: 51 U/L (ref 38–126)
Anion gap: 5 (ref 5–15)
BUN: 14 mg/dL (ref 8–23)
CO2: 28 mmol/L (ref 22–32)
Calcium: 8.6 mg/dL — ABNORMAL LOW (ref 8.9–10.3)
Chloride: 109 mmol/L (ref 98–111)
Creatinine, Ser: 0.59 mg/dL — ABNORMAL LOW (ref 0.61–1.24)
GFR, Estimated: >60 mL/min (ref >60)
Glucose, Bld: 68 mg/dL — ABNORMAL LOW (ref 70–99)
Potassium: 3.5 mmol/L (ref 3.5–5.1)
Sodium: 142 mmol/L (ref 135–145)
Total Bilirubin: 0.9 mg/dL (ref 0.3–1.2)
Total Protein: 5.5 g/dL — ABNORMAL LOW (ref 6.5–8.1)

## 2022-03-10 LAB — CBC WITH DIFFERENTIAL/PLATELET
Abs Immature Granulocytes: 0.01 10*3/uL (ref 0.00–0.07)
Basophils Absolute: 0 10*3/uL (ref 0.0–0.1)
Basophils Relative: 0 %
Eosinophils Absolute: 0.1 10*3/uL (ref 0.0–0.5)
Eosinophils Relative: 2 %
HCT: 39.8 % (ref 39.0–52.0)
Hemoglobin: 13.1 g/dL (ref 13.0–17.0)
Immature Granulocytes: 0 %
Lymphocytes Relative: 29 %
Lymphs Abs: 1.3 10*3/uL (ref 0.7–4.0)
MCH: 33.1 pg (ref 26.0–34.0)
MCHC: 32.9 g/dL (ref 30.0–36.0)
MCV: 100.5 fL — ABNORMAL HIGH (ref 80.0–100.0)
Monocytes Absolute: 0.4 10*3/uL (ref 0.1–1.0)
Monocytes Relative: 9 %
Neutro Abs: 2.6 10*3/uL (ref 1.7–7.7)
Neutrophils Relative %: 60 %
Platelets: 120 10*3/uL — ABNORMAL LOW (ref 150–400)
RBC: 3.96 MIL/uL — ABNORMAL LOW (ref 4.22–5.81)
RDW: 13.1 % (ref 11.5–15.5)
WBC: 4.5 10*3/uL (ref 4.0–10.5)
nRBC: 0 % (ref 0.0–0.2)

## 2022-03-10 LAB — MAGNESIUM: Magnesium: 2.4 mg/dL (ref 1.7–2.4)

## 2022-03-10 LAB — MRSA NEXT GEN BY PCR, NASAL: MRSA by PCR Next Gen: DETECTED — AB

## 2022-03-10 LAB — PROTIME-INR
INR: 1.1 (ref 0.8–1.2)
Prothrombin Time: 14.5 seconds (ref 11.4–15.2)

## 2022-03-10 MED ORDER — CHLORHEXIDINE GLUCONATE CLOTH 2 % EX PADS
6.0000 | MEDICATED_PAD | Freq: Every day | CUTANEOUS | Status: DC
  Administered 2022-03-11 – 2022-03-13 (×3): 6 via TOPICAL

## 2022-03-10 MED ORDER — LACTATED RINGERS IV SOLN
INTRAVENOUS | Status: AC
Start: 2022-03-10 — End: 2022-03-10

## 2022-03-10 MED ORDER — FOLIC ACID 1 MG PO TABS
1.0000 mg | ORAL_TABLET | Freq: Every day | ORAL | Status: DC
  Administered 2022-03-10 – 2022-03-13 (×3): 1 mg via ORAL
  Filled 2022-03-10 (×4): qty 1

## 2022-03-10 MED ORDER — MUPIROCIN 2 % EX OINT
1.0000 "application " | TOPICAL_OINTMENT | Freq: Two times a day (BID) | CUTANEOUS | Status: DC
  Administered 2022-03-10 – 2022-03-13 (×7): 1 via NASAL
  Filled 2022-03-10 (×2): qty 22

## 2022-03-10 NOTE — Assessment & Plan Note (Signed)
 #)   Acute encephalopathy: Relative to baseline frontotemporal dementia, memory care facility staff convey concerned that the patient is exhibiting evidence over the course the last day of confusion, agitation, relative to this baseline mental status, as further qualified above.  No overt source for this potential acute encephalopathy superimposed frontotemporal dementia.  As described above, there is potential for an acute component relating to patient's chronic subdural hematoma as observed on today CT head.  This is felt to be unlikely to be a significant contributor towards the patient's altered mental status superimposed on frontotemporal dementia, but we will closely monitor via pursuit to repeat CT head tomorrow as well as monitoring of interval neurologic status, as further detailed below.   Differential for patient's acute encephalopathy superimposed on frontal temporal dementia includes potential metabolic contribution from dehydration in the setting of acute prerenal azotemia as well as physical exam evidence to suggest mild dehydration.   No overt source of underlying infection identified at this time, including urinalysis that was not suggestive of UTI, while chest x-ray showed no evidence of acute cardiopulmonary process.  Urinary drug screen pan negative, will serum ethanol level less than 10 and ammonia level noted to be 13.  No additional overt metabolic/electrolyte contributions at this time.  No obvious contributory pharmacologic factors.    Plan: fall precautions. Repeat CMP/CBC in the AM. Check magnesium level. check TSH.  Further evaluation and management of suspected dehydration including generalized lipids, as further detailed below.  Every 4 hours neurochecks x3 occurrences.  Check INR.  Repeat noncontrast CT head in the morning.

## 2022-03-10 NOTE — Evaluation (Signed)
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 No pertinent surgical history. HPI:  Patient is a 66 y.o. male with PMH: frontotemporal dementia, chronic SDH who was admitted to [facility] on 03/09/22 with acute encephalopathy after presenting from memory care facility for evaluation of AMS, including increased agitation.    Assessment / Plan / Recommendation  Clinical Impression  Patient presents with what appears to be a primary cognitive-based oral phase dysphagia. No overt s/s aspiration or penetration were observed. Patient able to drink via cup and with straw but unable to utilize spoon to eat ice cream. No coughing or throat clearing observed with thin liquids (water). Patient fed self ice cream by 'drinking' it after SLP softened it up. Mastication of cracker was mildly delayed but trace-mild amount of oral residuals post initial swallows cleared with sip of drink. SLP is recommending to continue with regular solids, thin liquids but to consider finger foods as patient seems to do best when he is feeding himself, and he is not able to utilize utensils due to cognitive impairment. SLP will plan to f/u at least one more time to ensure toleration of diet. SLP Visit Diagnosis: Dysphagia, oral phase (R13.11)    Aspiration Risk  No limitations;Mild aspiration risk    Diet Recommendation  Regular;Thin liquid   Liquid Administration via: Cup;Straw Medication Administration: Crushed with puree Supervision: Patient able to self feed;Full supervision/cueing for compensatory strategies Postural Changes: Seated upright at 90 degrees    Other  Recommendations Oral Care Recommendations: Oral care BID;Staff/trained caregiver to provide oral care    Recommendations for follow up therapy are one component of a multi-disciplinary discharge planning process, led by the attending physician.  Recommendations may be updated based on patient status, additional functional criteria and insurance authorization.  Follow up Recommendations Skilled nursing-short term rehab (<3 hours/day)      Assistance Recommended at Discharge Frequent or constant Supervision/Assistance  Functional Status Assessment    Frequency and Duration min 1 x/week  1 week       Prognosis Prognosis for Safe Diet Advancement: Good Barriers to Reach Goals: Cognitive deficits      Swallow Study   General Date of Onset: 03/10/22 HPI: Patient is a 66 y.o. male with PMH: frontotemporal dementia, chronic SDH who was admitted to [facility] on 03/09/22 with acute encephalopathy after presenting from memory care facility for evaluation of AMS, including increased agitation. Type of Study: Bedside Swallow Evaluation Previous Swallow Assessment: during recent admission April 2023 Diet Prior to this Study: Regular;Thin liquids Temperature Spikes Noted: No Respiratory Status: Room air History of Recent Intubation: No Behavior/Cognition: Alert;Cooperative;Requires cueing;Distractible Oral Cavity Assessment: Within Functional Limits Oral Care Completed by SLP: No Oral Cavity - Dentition: Adequate natural dentition Vision: Functional for self-feeding Self-Feeding Abilities: Able to feed self;Needs set up;Needs assist Patient Positioning: Upright in bed Baseline Vocal Quality: Other (comment) (normal during limited times  voicing/verbalizations were  observed) Volitional Cough: Cognitively unable to elicit Volitional Swallow: Unable to elicit    Oral/Motor/Sensory Function Overall Oral Motor/Sensory Function: Within functional limits   Ice Chips     Thin Liquid Thin Liquid: Within functional limits Presentation: Straw;Self Fed    Nectar Thick     Honey Thick     Puree Puree: Within functional limits Presentation: Self Fed   Solid     Solid: Impaired Oral Phase Impairments: Impaired mastication;Poor awareness of bolus      Angela Nevin, MA, CCC-SLP Speech Therapy

## 2022-03-10 NOTE — Plan of Care (Signed)
  Problem: Nutrition: Goal: Adequate nutrition will be maintained Outcome: Progressing   Problem: Coping: Goal: Level of anxiety will decrease Outcome: Progressing   

## 2022-03-10 NOTE — TOC Initial Note (Signed)
Transition of Care [facility]) - Initial/Assessment Note    Patient Details  Name: Micheal [name] MRN: [id number] Date of Birth: [birth year]  Transition of Care [facility]) CM/SW Contact:    Darleene Cleaver, LCSW Phone Number: 03/10/2022, 6:05 PM  Clinical Narrative:                  Patient is a 66 year old male from [facility] [place] [facility].  Patient is disoriented x4, assessment completed by speaking to patient's sister [name] [phone].  Per patient's sister they are working on trying to get patient to a different memory care ALF.  Per sister, [name] is not able to provide the support that patient needs anymore due to his dementia getting worse.  Patient was brought to hospital because he went to another resident's room and tried to take their television.  Patient's sister has been working on trying to find him a different ALF that he can go to.  Patient's sister stated they have spoken to [facility], and a nurse is supposed to be coming to evaluate him at hospital on Monday.  Patient's sister stated what happens if [facility] does not accept patient, CSW informed her that he will have to return back to [facility], stay with her and have home health, or go to a SNF.  Patient's sister expressed understanding.  CSW to have weekday [facility] worker follow up with patient's sister tomorrow to discuss if [facility] will accept patient or not.  Expected Discharge Plan: Assisted Living Barriers to Discharge: Continued Medical Work up   Patient Goals and CMS Choice Patient states their goals for this hospitalization and ongoing recovery are:: Per sister plan is for patient to go to a different Memory Care ALF. CMS Medicare.gov Compare Post Acute Care list provided to:: Patient Represenative (must comment) Choice offered to / list presented to : Sibling, HC POA / Guardian  Expected Discharge Plan and Services Expected Discharge Plan: Assisted Living     Post Acute Care Choice:  Home Health Living arrangements for the past 2 months: Assisted Living Facility                                      Prior Living Arrangements/Services Living arrangements for the past 2 months: Assisted Living Facility Lives with:: Facility Resident Patient language and need for interpreter reviewed:: Yes Do you feel safe going back to the place where you live?: No   Per Patient's sister they want him to go to a different ALF.  Need for Family Participation in Patient Care: Yes (Comment) Care giver support system in place?: Yes (comment)   Criminal Activity/Legal Involvement Pertinent to Current Situation/Hospitalization: No - Comment as needed  Activities of Daily Living      Permission Sought/Granted Permission sought to share information with : Case Manager, Facility Medical sales representative, Family Supports Permission granted to share information with : Yes, Verbal Permission Granted  Share Information with NAME: Baird Cancer [phone]  [phone]  [place]   [phone]           Emotional Assessment Appearance:: Appears stated age   Affect (typically observed): Accepting, Appropriate, Stable Orientation: : Oriented to Self Alcohol / Substance Use: Not Applicable Psych Involvement: Yes (comment)  Admission diagnosis:  Acute encephalopathy [G93.40] Patient Active Problem List   Diagnosis Date Noted   Chronic subdural hematoma (HCC) 03/10/2022   Dehydration 03/10/2022  Anemia of chronic disease 03/10/2022   Acute encephalopathy 03/09/2022   MRSA UTI 01/25/2022   Hypoglycemia 01/25/2022   Frontotemporal dementia (HCC) 01/23/2022   Acute on chronic intracranial subdural hematoma (HCC) 01/23/2022   History of pulmonary embolus (PE) 01/23/2022   MDD (major depressive disorder) 01/23/2022   Goals of care, counseling/discussion 01/23/2022   Protein-calorie malnutrition, severe 01/21/2022   Sepsis due to pneumonia (HCC) 01/18/2022    Unstable gait 08/06/2021   Weakness    Frontotemporal dementia with behavioral disturbance (HCC) 03/01/2020   Dehydration with hypernatremia 03/01/2020   Essential hypertension 03/01/2020   PCP:  System, Provider Not In Pharmacy:  No Pharmacies Listed    Social Determinants of Health (SDOH) Interventions    Readmission Risk Interventions     View : No data to display.

## 2022-03-10 NOTE — Assessment & Plan Note (Signed)
 #)   Dehydration: Clinical suspicion for such, including the appearance of dry oral mucous membranes as well as laboratory findings notable for acute prerenal azotemia. Appears to be in the setting of   mild decrease in oral intake concomitant with report of acute encephalopathy superimposed on frontal temporal dementia.  Presenting labs reflect interval increase in serum creatinine, but not to the threshold for meeting criteria for acute kidney injury. no e/o associated hypotension.   Plan: Monitor strict I's and O's.  Daily weights.  Repeat BMP in the morning. IVF's in form of lactated Ringer's at 50 cc/h x 10 hours.

## 2022-03-10 NOTE — Assessment & Plan Note (Signed)
  #)   Chronic anemia: Documented history of such, associated with baseline hemoglobin range of 11-13, with presenting hemoglobin found to be consistent with his baseline range.  Suspect element of anemia of chronic disease.   Plan: Check INR.  Repeat CBC in the morning.

## 2022-03-10 NOTE — Assessment & Plan Note (Signed)
 #)   Chronic subdural hematoma: Documented history of such, with CT head performed in the ED today to evaluate report of acute encephalopathy superimposed on frontal temporal dementia showing interval decline in the size of chronic left subdural hematoma, but also noting a small underlying acute component cannot be excluded.  Of note, not on any blood thinners as an outpatient, and no recent trauma.  patient's case and imaging with d/w on-call neurosurgery service, Verlin Dike, NP, who agreed that small underlying acute component of the patient's chronic subdural hematoma cannot be eliminated per review of today CT head.  Neurosurgery confirmed no indication for neurosurgical intervention at this time, but rather recommended repeat CT head in the morning, and was amenable to the patient being admitted to [facility] long as opposed to [facility].   Plan: Per discussions with neurosurgery service, repeat CT head has been ordered for the morning.  Serial neurochecks.  Monitor on telemetry, including for evidence of bradycardia as well as monitoring for evidence of worsening hypertension.  Repeat CBC in the morning.  Check INR.

## 2022-03-10 NOTE — H&P (Signed)
History and Physical    PLEASE NOTE THAT DRAGON DICTATION SOFTWARE WAS USED IN THE CONSTRUCTION OF THIS NOTE.   Micheal [name] TZG:[id number] DOB: 08/10/1956 DOA: 03/09/2022  PCP: System, Provider Not In (will further assess) Patient coming from: memory care facilty  I have personally briefly reviewed patient's old medical records in [facility]  Chief Complaint: Altered mental status  HPI: Micheal [name] is a 66 y.o. male with medical history significant for frontotemporal dementia, chronic subdural hematoma, who is admitted to [facility] on 03/09/2022 with acute encephalopathy after presenting from memory care facility to [facility] ED for evaluation of altered mental status.   The context of the patient's altered mental status, the following history is provided by memory care facility staff, my discussions with the EDP, and via chart review.  In the context of baseline frontotemporal dementia in which the patient is reportedly nearly nonverbal at baseline, occasionally answering yes or no to questions posed to him, the patient resides in a memory care facility.  Associated staff verbalized their concern that the patient appeared confused relative to his baseline mental status over the course the last day, and he was also appearing more agitated than normal.  Within this context, memory care facility staff reported that the patient, in an unsolicited fashion, entered the room of one of the other residents of the memory care facility, and attempted to leave with the patient's television, attempting to pull the television off the wall, coaxial cables and all.  Memory care facility staff conveyed that this is Dr. For this patient.  Recent/preceding trauma.  No overt recent fevers.  No associated any recent vomiting, diarrhea.   Medical history is also notable for chronic subdural hematoma.  He is not on any blood thinners, and no known history of underlying liver disease.     ED Course:   Vital signs in the ED were notable for the following: Afebrile; heart rate 65-81; blood pressure 134/86 -150/94; respiratory rate 15-19, oxygen saturation 97 to 100% on room air.  Labs were notable for the following: CMP notable for the following: Bicarbonate 31, BUN 20, creatinine 0.68 compared to most recent prior serum creatinine data point of 0.60 on 01/30/2022, BUN/creatinine ratio 29.4, glucose 90, calcium, corrected for mild hypoalbuminemia noted to be 9.3, albumin 3.6, other liver enzymes found to be within normal limits.  Ammonia 13, serum ethanol level less than 10.  CBC notable for white blood cell count 5900, hemoglobin 12.5 compared to 12.4 on 01/27/2022.  Lactic acid 1.2.  Urinalysis showed no white blood cells, leukocyte esterase/nitrate negative, specific gravity 1.016.  Urinary drug screen pan negative.  Imaging and additional notable ED work-up: EKG showed sinus rhythm with heart rate 70, normal intervals, no evidence of T wave or ST changes, including no evidence of ST elevation.  Chest x-ray showed evidence of acute cardiopulmonary process, including no evidence of infiltrate, edema, effusion, or pneumothorax.  Noncontrast CT head, in comparison to most recent prior CT head from 01/18/2022, showed interval decrease in size of chronic 6 mm left subdural hematoma, will also noting that a small underlying acute component cannot be excluded; today CT head also noted interval decrease in the 2 mm left to right midline shift previously observed on CT scan from 01/18/2022.  EDP discussed the patient's case and imaging with on-call neurosurgery service, Verlin Dike, NP, who agreed that small underlying acute component of the patient's chronic subdural hematoma cannot be eliminated per review of today CT head.  Neurosurgery confirmed no indication for neurosurgical intervention at this time, but rather recommended repeat CT head in the morning, and was amenable to the patient being admitted to  [facility] long as opposed to [facility].  While in the ED, the following were administered: (No medications or IV fluids administered)  Subsequently, the patient was admitted for overnight observation for further evaluation and management of presenting acute encephalopathy superimposed on front temporal dementia.     Review of Systems: As per HPI otherwise 10 point review of systems negative.   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 No pertinent surgical history.  Social History:  reports that he has never smoked. He has never used smokeless tobacco. He reports that he does not drink alcohol and does not use drugs.   No Known Allergies  Family History  Problem Relation Age of Onset   Dementia Father    Parkinsonism Father    Dementia Paternal Aunt    Dementia Paternal Uncle    Dementia Paternal Grandfather     Family history reviewed and not pertinent    Prior to Admission medications   Medication Sig Start Date End Date Taking? Authorizing Provider  acetaminophen (TYLENOL) 325 MG tablet Take 2 tablets (650 mg total) by mouth every 4 (four) hours as needed for mild pain (or temp > 37.5 C (99.5 F)). 08/14/21  Yes Lonia Blood, MD  benztropine (COGENTIN) 1 MG tablet Take 1 tablet (1 mg total) by mouth 2 (two) times daily. 08/14/21  Yes Lonia Blood, MD  guaifenesin (ROBITUSSIN) 100 MG/5ML syrup Take 200 mg by mouth every 6 (six) hours as needed for cough.   Yes [provider]  haloperidol decanoate (HALDOL DECANOATE) 50 MG/ML injection Inject 50 mg into the muscle every 28 (twenty-eight) days.   Yes [provider]  loperamide  (IMODIUM) 2 MG capsule Take 2 mg by mouth as needed for diarrhea or loose stools (max 8 doses/ 24 hours).   Yes [provider]  Neomycin-Bacitracin-Polymyxin (TRIPLE ANTIBIOTIC) [phone] OINT Apply 1 application topically daily as needed (minor skin tears/abrasians).   Yes [provider]  NON FORMULARY Take 1 Can by mouth 3 (three) times daily. Mighty Shakes   Yes [provider]  polyethylene glycol (MIRALAX / GLYCOLAX) 17 g packet Take 17 g by mouth daily as needed for mild constipation. Patient taking differently: Take 17 g by mouth daily as needed (constipation). 03/07/20  Yes Zannie Cove, MD     Objective    Physical Exam: Vitals:   03/09/22 1902 03/09/22 1915 03/09/22 1958 03/09/22 2156  BP:  (!) 153/118 (!) 142/116 (!) 131/54  Pulse: 72 67 65 64  Resp: 17 19 15 16   Temp:    97.8 F (36.6 C)  TempSrc:    Oral  SpO2: 100% 100% 100% 100%  Weight:    62.5 kg  Height:    6' (1.829 m)    General: appears to be stated age; alert; non-verbal Skin: warm, dry, no rash Head:  AT/[place] Mouth:  Oral mucosa membranes appear dry, normal dentition Neck: supple; trachea midline Heart:  RRR; did not appreciate any M/R/G Lungs: CTAB,  did not appreciate any wheezes, rales, or rhonchi Abdomen: + BS; soft, ND, NT Vascular: 2+ pedal pulses b/l; 2+ radial pulses b/l Extremities: no peripheral edema, no muscle wasting Neuro: In the setting of the patient's current mental status and associated inability to follow instructions, unable to perform full neurologic exam at this time.  As such, assessment of strength, sensation, and cranial nerves is limited at this time. Patient noted to spontaneously move all 4 extremities. No tremors.      Labs on Admission: I have personally reviewed following labs and imaging studies  CBC: Recent Labs  Lab 03/09/22 1730  WBC 5.9  NEUTROABS 3.6  HGB 12.5*  HCT 38.3*  MCV 100.8*  PLT 146*   Basic Metabolic Panel: Recent  Labs  Lab 03/09/22 1730  NA 144  K 3.9  CL 109  CO2 31  GLUCOSE 90  BUN 20  CREATININE 0.68  CALCIUM 8.9  MG 2.3   GFR: Estimated Creatinine Clearance: 81.4 mL/min (by C-G formula based on SCr of 0.68 mg/dL). Liver Function Tests: Recent Labs  Lab 03/09/22 1730  AST 18  ALT 16  ALKPHOS 57  BILITOT 0.8  PROT 5.9*  ALBUMIN 3.6   No results for input(s): LIPASE, AMYLASE in the last 168 hours. Recent Labs  Lab 03/09/22 1730  AMMONIA 13   Coagulation Profile: No results for input(s): INR, PROTIME in the last 168 hours. Cardiac Enzymes: No results for input(s): CKTOTAL, CKMB, CKMBINDEX, TROPONINI in the last 168 hours. BNP (last 3 results) No results for input(s): PROBNP in the last 8760 hours. HbA1C: No results for input(s): HGBA1C in the last 72 hours. CBG: Recent Labs  Lab 03/09/22 1605  GLUCAP 82   Lipid Profile: No results for input(s): CHOL, HDL, LDLCALC, TRIG, CHOLHDL, LDLDIRECT in the last 72 hours. Thyroid Function Tests: Recent Labs    03/09/22 2233  TSH 1.302   Anemia Panel: No results for input(s): VITAMINB12, FOLATE, FERRITIN, TIBC, IRON, RETICCTPCT in the last 72 hours. Urine analysis:    Component Value Date/Time   COLORURINE YELLOW 03/09/2022 1900   APPEARANCEUR CLEAR 03/09/2022 1900   LABSPEC 1.016 03/09/2022 1900   PHURINE 6.0 03/09/2022 1900   GLUCOSEU NEGATIVE 03/09/2022 1900   HGBUR NEGATIVE 03/09/2022 1900   BILIRUBINUR NEGATIVE 03/09/2022 1900   KETONESUR NEGATIVE 03/09/2022 1900   PROTEINUR NEGATIVE 03/09/2022 1900   NITRITE NEGATIVE 03/09/2022 1900   LEUKOCYTESUR NEGATIVE 03/09/2022 1900    Radiological Exams on Admission: CT Head Wo Contrast  Result Date: 03/09/2022 CLINICAL DATA:  Altered mental status, nontraumatic (Ped 0-17y) EXAM: CT HEAD WITHOUT CONTRAST TECHNIQUE: Contiguous axial images were obtained from the base of the skull through the vertex without intravenous contrast. RADIATION DOSE REDUCTION: This exam was  performed according to the departmental dose-optimization program which includes automated exposure control, adjustment of the mA and/or kV according to patient size and/or use of iterative reconstruction technique. COMPARISON:  CT head 01/18/2022, CT head 12/06/2021, CT head 03/01/2020 BRAIN: BRAIN Cerebral ventricle sizes are concordant with the degree of cerebral volume loss. Patchy and confluent areas of decreased attenuation are noted throughout the deep and periventricular white matter of the cerebral hemispheres bilaterally, compatible with chronic microvascular ischemic disease. No evidence of large-territorial acute infarction. No parenchymal hemorrhage. No mass lesion. Interval decrease in size of a slightly heterogeneous chronic 6 mm left subdural hematoma (5:45). Similar-appearing chronic calcifications along the left centrum semiovale. Interval decrease in 2 mm left-to-right midline shift (from 4 mm). No mass  effect or midline shift. No hydrocephalus. Basilar cisterns are patent. Vascular: No hyperdense vessel. Skull: No acute fracture or focal lesion. Sinuses/Orbits: Paranasal sinuses and mastoid air cells are clear. The orbits are unremarkable. Other: None. IMPRESSION: Interval decrease in size of a slightly heterogeneous chronic 6 mm left subdural hematoma. Small underlying acute component is not excluded. Interval decrease in 2 mm left-to-right midline shift Electronically Signed   By: Tish Frederickson M.D.   On: 03/09/2022 17:09   DG Chest Portable 1 View  Result Date: 03/09/2022 CLINICAL DATA:  Altered mental status. EXAM: PORTABLE CHEST 1 VIEW COMPARISON:  Radiograph and CT 01/18/2022 FINDINGS: Resolved right basilar opacity from prior exams. No radiographic residual. No new or acute airspace disease. Stable heart size and mediastinal contours. No pulmonary edema, pleural effusion, or pneumothorax. On limited assessment, no acute osseous abnormalities are seen. IMPRESSION: No acute chest  findings. Resolved right basilar opacity from prior exams. Electronically Signed   By: Narda Rutherford M.D.   On: 03/09/2022 17:02     EKG: Independently reviewed, with result as described above.    Assessment/Plan    Principal Problem:   Acute encephalopathy Active Problems:   Chronic subdural hematoma (HCC)   Dehydration   Anemia of chronic disease      #) Acute encephalopathy: Relative to baseline frontotemporal dementia, memory care facility staff convey concerned that the patient is exhibiting evidence over the course the last day of confusion, agitation, relative to this baseline mental status, as further qualified above.  No overt source for this potential acute encephalopathy superimposed frontotemporal dementia.  As described above, there is potential for an acute component relating to patient's chronic subdural hematoma as observed on today CT head.  This is felt to be unlikely to be a significant contributor towards the patient's altered mental status superimposed on frontotemporal dementia, but we will closely monitor via pursuit to repeat CT head tomorrow as well as monitoring of interval neurologic status, as further detailed below.   Differential for patient's acute encephalopathy superimposed on frontal temporal dementia includes potential metabolic contribution from dehydration in the setting of acute prerenal azotemia as well as physical exam evidence to suggest mild dehydration.   No overt source of underlying infection identified at this time, including urinalysis that was not suggestive of UTI, while chest x-ray showed no evidence of acute cardiopulmonary process.  Urinary drug screen pan negative, will serum ethanol level less than 10 and ammonia level noted to be 13.  No additional overt metabolic/electrolyte contributions at this time.  No obvious contributory pharmacologic factors.    Plan: fall precautions. Repeat CMP/CBC in the AM. Check magnesium level. check  TSH.  Further evaluation and management of suspected dehydration including generalized lipids, as further detailed below.  Every 4 hours neurochecks x3 occurrences.  Check INR.  Repeat noncontrast CT head in the morning.        #) Chronic subdural hematoma: Documented history of such, with CT head performed in the ED today to evaluate report of acute encephalopathy superimposed on frontal temporal dementia showing interval decline in the size of chronic left subdural hematoma, but also noting a small underlying acute component cannot be excluded.  Of note, not on any blood thinners as an outpatient, and no recent trauma.  patient's case and imaging with d/w on-call neurosurgery service, Verlin Dike, NP, who agreed that small underlying acute component of the patient's chronic subdural hematoma cannot be eliminated per review of today CT head.  Neurosurgery confirmed  no indication for neurosurgical intervention at this time, but rather recommended repeat CT head in the morning, and was [facility]   Plan: Per discussions with neurosurgery service, repeat CT head has been ordered for the morning.  Serial neurochecks.  Monitor on telemetry, including for evidence of bradycardia as well as monitoring for evidence of worsening hypertension.  Repeat CBC in the morning.  Check INR.          #) Dehydration: Clinical suspicion for such, including the appearance of dry oral mucous membranes as well as laboratory findings notable for acute prerenal azotemia. Appears to be in the setting of   mild decrease in oral intake concomitant with report of acute encephalopathy superimposed on frontal temporal dementia.  Presenting labs reflect interval increase in serum creatinine, but not to the threshold for meeting criteria for acute kidney injury. no e/o associated hypotension.   Plan: Monitor strict I's and O's.  Daily weights.  Repeat  BMP in the morning. IVF's in form of lactated Ringer's at 50 cc/h x 10 hours.         #) Chronic anemia: Documented history of such, associated with baseline hemoglobin range of 11-13, with presenting hemoglobin found to be consistent with his baseline range.  Suspect element of anemia of chronic disease.   Plan: Check INR.  Repeat CBC in the morning.       DVT prophylaxis: SCD's   Code Status: DNR; as noted/documented during previous hospitalizations Family Communication: none Disposition Plan: Per Rounding Team Consults called: Case/imaging discussed with on-call neurosurgical service, as above;  Admission status: Observation   PLEASE NOTE THAT DRAGON DICTATION SOFTWARE WAS USED IN THE CONSTRUCTION OF THIS NOTE.   Chaney BornJustin B Alyssha Housh DO [facility] From 7PM - 7AM   03/10/2022, 3:00 AM

## 2022-03-10 NOTE — Progress Notes (Addendum)
PROGRESS NOTE    Micheal [name]  HAL:[id number] DOB: 06-25-1956 DOA: 03/09/2022 PCP: System, Provider Not In   Brief Narrative:  66 year old male with history of frontotemporal dementia, chronic subdural hematoma presented with altered mental status from memory care facility.  Patient is reportedly nearly nonverbal at baseline, occasionally answering yes or no questions.  Patient appeared more confused to the facility staff over the last day prior to presentation and was appearing more agitated than normal.  On presentation, blood work was mostly unremarkable UA was negative.  Chest x-ray showed no acute cardiopulmonary process.  CT of the head showed interval decrease in size of chronic 6 mm left subdural hematoma, small acute component could not be excluded.  Neurosurgery on-call was consulted by ED provider: Neurosurgery recommended no neurosurgical intervention and repeat CT of the head in the morning.  Assessment & Plan:   Acute possibly metabolic encephalopathy History of frontotemporal dementia Chronic subdural hematoma -Patient apparently was more agitated compared to baseline as per facility staff. -Imaging CT of the head was as above.  On-call neurosurgery recommended no neurosurgical intervention and repeat CT of the head in the morning. -Repeat CT of the head today showed no significant intracranial mass effect with findings stable compared to yesterday. -Monitor mental status.  Fall precautions.  PT eval. -No evidence of any kind of infection.  Blood work essentially unremarkable.  Chest x-ray and UA negative for infection as well. -Continue benztropine -Palliative care consultation for goals of care discussion  Dehydration -Treated with IV fluids and subsequently discussed.  Encourage oral intake  Thrombocytopenia -Questionable cause.  Monitor.  No signs of bleeding   DVT prophylaxis: Start Lovenox Code Status: DNR Family Communication: Sister on phone 03/10/22 Disposition  Plan: Status is: Observation The patient will require care spanning > 2 midnights and should be moved to inpatient because: Need for PT eval.    Consultants: ED provider discussed with neurosurgery on-call on 03/09/2022  Procedures: None  Antimicrobials: None   Subjective: Patient seen and examined at bedside.  Awake, nonverbal.  No fever, seizures or agitation reported by nursing staff.  Objective: Vitals:   03/10/22 0500 03/10/22 0612 03/10/22 1003 03/10/22 1014  BP:  (!) 146/63 (!) 141/88   Pulse:  (!) 54 61 91  Resp:  16 16   Temp:  (!) 97.5 F (36.4 C) 97.6 F (36.4 C) 97.6 F (36.4 C)  TempSrc:  Axillary Axillary Oral  SpO2:  100% (!) 80% 90%  Weight: 62.5 kg     Height:        Intake/Output Summary (Last 24 hours) at 03/10/2022 1059 Last data filed at 03/10/2022 0926 Gross per 24 hour  Intake 205.54 ml  Output 2000 ml  Net -1794.46 ml   Filed Weights   03/09/22 2156 03/10/22 0500  Weight: 62.5 kg 62.5 kg    Examination:  General exam: Appears calm and comfortable.  Currently on room air.  Looks chronically ill and deconditioned. Respiratory system: Bilateral decreased breath sounds at bases with some scattered crackles Cardiovascular system: S1 & S2 heard, mild intermittent bradycardia present Gastrointestinal system: Abdomen is nondistended, soft and nontender. Normal bowel sounds heard. Extremities: No cyanosis, clubbing, edema  Central nervous system: Awake, nonverbal.  No focal neurological deficits. Moving extremities Skin: No rashes, lesions or ulcers Psychiatry: Could not be assessed because of mental status.  No signs of agitation currently.    Data Reviewed: I have personally reviewed following labs and imaging studies  CBC: Recent Labs  Lab 03/09/22 1730 03/10/22 0827  WBC 5.9 4.5  NEUTROABS 3.6 2.6  HGB 12.5* 13.1  HCT 38.3* 39.8  MCV 100.8* 100.5*  PLT 146* 120*   Basic Metabolic Panel: Recent Labs  Lab 03/09/22 1730  03/10/22 0517  NA 144 142  K 3.9 3.5  CL 109 109  CO2 31 28  GLUCOSE 90 68*  BUN 20 14  CREATININE 0.68 0.59*  CALCIUM 8.9 8.6*  MG 2.3 2.4   GFR: Estimated Creatinine Clearance: 81.4 mL/min (A) (by C-G formula based on SCr of 0.59 mg/dL (L)). Liver Function Tests: Recent Labs  Lab 03/09/22 1730 03/10/22 0517  AST 18 18  ALT 16 14  ALKPHOS 57 51  BILITOT 0.8 0.9  PROT 5.9* 5.5*  ALBUMIN 3.6 3.3*   No results for input(s): LIPASE, AMYLASE in the last 168 hours. Recent Labs  Lab 03/09/22 1730  AMMONIA 13   Coagulation Profile: Recent Labs  Lab 03/10/22 0517  INR 1.1   Cardiac Enzymes: No results for input(s): CKTOTAL, CKMB, CKMBINDEX, TROPONINI in the last 168 hours. BNP (last 3 results) No results for input(s): PROBNP in the last 8760 hours. HbA1C: No results for input(s): HGBA1C in the last 72 hours. CBG: Recent Labs  Lab 03/09/22 1605  GLUCAP 82   Lipid Profile: No results for input(s): CHOL, HDL, LDLCALC, TRIG, CHOLHDL, LDLDIRECT in the last 72 hours. Thyroid Function Tests: Recent Labs    03/09/22 2233  TSH 1.302   Anemia Panel: No results for input(s): VITAMINB12, FOLATE, FERRITIN, TIBC, IRON, RETICCTPCT in the last 72 hours. Sepsis Labs: Recent Labs  Lab 03/09/22 1730  LATICACIDVEN 1.2    No results found for this or any previous visit (from the past 240 hour(s)).       Radiology Studies: CT HEAD WO CONTRAST (5MM)  Result Date: 03/10/2022 CLINICAL DATA:  66 year old male with altered mental status and small left subdural hematoma. EXAM: CT HEAD WITHOUT CONTRAST TECHNIQUE: Contiguous axial images were obtained from the base of the skull through the vertex without intravenous contrast. RADIATION DOSE REDUCTION: This exam was performed according to the departmental dose-optimization program which includes automated exposure control, adjustment of the mA and/or kV according to patient size and/or use of iterative reconstruction technique.  COMPARISON:  Head CT 03/09/2022 and earlier. FINDINGS: Brain: Small mixed density left subdural hematoma is stable, 4 mm or less throughout the left hemisphere. This was up to 15 mm in April. There is trace rightward midline shift. No new intracranial hemorrhage identified. Stable gray-white matter differentiation throughout the brain. No cortically based acute infarct identified. No ventriculomegaly. Normal basilar cisterns. Vascular: Mild Calcified atherosclerosis at the skull base. Skull: Stable, intact. Sinuses/Orbits: Visualized paranasal sinuses and mastoids are stable and well aerated. Other: No acute orbit or scalp soft tissue finding. IMPRESSION: 1. Significantly regressed since April mixed density left side subdural hematoma, stable from yesterday. No significant intracranial mass effect. 2. No new intracranial abnormality. Electronically Signed   By: Odessa FlemingH  Hall M.D.   On: 03/10/2022 09:19   CT Head Wo Contrast  Result Date: 03/09/2022 CLINICAL DATA:  Altered mental status, nontraumatic (Ped 0-17y) EXAM: CT HEAD WITHOUT CONTRAST TECHNIQUE: Contiguous axial images were obtained from the base of the skull through the vertex without intravenous contrast. RADIATION DOSE REDUCTION: This exam was performed according to the departmental dose-optimization program which includes automated exposure control, adjustment of the mA and/or kV according to patient size and/or use of iterative reconstruction technique. COMPARISON:  CT head  01/18/2022, CT head 12/06/2021, CT head 03/01/2020 BRAIN: BRAIN Cerebral ventricle sizes are concordant with the degree of cerebral volume loss. Patchy and confluent areas of decreased attenuation are noted throughout the deep and periventricular white matter of the cerebral hemispheres bilaterally, compatible with chronic microvascular ischemic disease. No evidence of large-territorial acute infarction. No parenchymal hemorrhage. No mass lesion. Interval decrease in size of a slightly  heterogeneous chronic 6 mm left subdural hematoma (5:45). Similar-appearing chronic calcifications along the left centrum semiovale. Interval decrease in 2 mm left-to-right midline shift (from 4 mm). No mass effect or midline shift. No hydrocephalus. Basilar cisterns are patent. Vascular: No hyperdense vessel. Skull: No acute fracture or focal lesion. Sinuses/Orbits: Paranasal sinuses and mastoid air cells are clear. The orbits are unremarkable. Other: None. IMPRESSION: Interval decrease in size of a slightly heterogeneous chronic 6 mm left subdural hematoma. Small underlying acute component is not excluded. Interval decrease in 2 mm left-to-right midline shift Electronically Signed   By: Tish Frederickson M.D.   On: 03/09/2022 17:09   DG Chest Portable 1 View  Result Date: 03/09/2022 CLINICAL DATA:  Altered mental status. EXAM: PORTABLE CHEST 1 VIEW COMPARISON:  Radiograph and CT 01/18/2022 FINDINGS: Resolved right basilar opacity from prior exams. No radiographic residual. No new or acute airspace disease. Stable heart size and mediastinal contours. No pulmonary edema, pleural effusion, or pneumothorax. On limited assessment, no acute osseous abnormalities are seen. IMPRESSION: No acute chest findings. Resolved right basilar opacity from prior exams. Electronically Signed   By: Narda Rutherford M.D.   On: 03/09/2022 17:02        Scheduled Meds:  benztropine  1 mg Oral BID   Continuous Infusions:        Glade Lloyd, MD [facility] 03/10/2022, 10:59 AM

## 2022-03-11 ENCOUNTER — Other Ambulatory Visit: Payer: Self-pay

## 2022-03-11 DIAGNOSIS — Z7189 Other specified counseling: Secondary | ICD-10-CM

## 2022-03-11 DIAGNOSIS — G9340 Encephalopathy, unspecified: Secondary | ICD-10-CM | POA: Diagnosis not present

## 2022-03-11 DIAGNOSIS — F03918 Unspecified dementia, unspecified severity, with other behavioral disturbance: Secondary | ICD-10-CM | POA: Diagnosis not present

## 2022-03-11 DIAGNOSIS — Z515 Encounter for palliative care: Secondary | ICD-10-CM

## 2022-03-11 DIAGNOSIS — I6203 Nontraumatic chronic subdural hemorrhage: Secondary | ICD-10-CM | POA: Diagnosis not present

## 2022-03-11 DIAGNOSIS — E860 Dehydration: Secondary | ICD-10-CM | POA: Diagnosis not present

## 2022-03-11 DIAGNOSIS — E538 Deficiency of other specified B group vitamins: Secondary | ICD-10-CM

## 2022-03-11 DIAGNOSIS — D638 Anemia in other chronic diseases classified elsewhere: Secondary | ICD-10-CM | POA: Diagnosis not present

## 2022-03-11 LAB — CBC WITH DIFFERENTIAL/PLATELET
Abs Immature Granulocytes: 0.01 10*3/uL (ref 0.00–0.07)
Basophils Absolute: 0 10*3/uL (ref 0.0–0.1)
Basophils Relative: 1 %
Eosinophils Absolute: 0.1 10*3/uL (ref 0.0–0.5)
Eosinophils Relative: 2 %
HCT: 39.8 % (ref 39.0–52.0)
Hemoglobin: 13.1 g/dL (ref 13.0–17.0)
Immature Granulocytes: 0 %
Lymphocytes Relative: 30 %
Lymphs Abs: 1.8 10*3/uL (ref 0.7–4.0)
MCH: 32.8 pg (ref 26.0–34.0)
MCHC: 32.9 g/dL (ref 30.0–36.0)
MCV: 99.7 fL (ref 80.0–100.0)
Monocytes Absolute: 0.5 10*3/uL (ref 0.1–1.0)
Monocytes Relative: 8 %
Neutro Abs: 3.5 10*3/uL (ref 1.7–7.7)
Neutrophils Relative %: 59 %
Platelets: 147 10*3/uL — ABNORMAL LOW (ref 150–400)
RBC: 3.99 MIL/uL — ABNORMAL LOW (ref 4.22–5.81)
RDW: 13.2 % (ref 11.5–15.5)
WBC: 6 10*3/uL (ref 4.0–10.5)
nRBC: 0 % (ref 0.0–0.2)

## 2022-03-11 LAB — COMPREHENSIVE METABOLIC PANEL
ALT: 14 U/L (ref 0–44)
AST: 16 U/L (ref 15–41)
Albumin: 3.2 g/dL — ABNORMAL LOW (ref 3.5–5.0)
Alkaline Phosphatase: 57 U/L (ref 38–126)
Anion gap: 4 — ABNORMAL LOW (ref 5–15)
BUN: 14 mg/dL (ref 8–23)
CO2: 27 mmol/L (ref 22–32)
Calcium: 8.6 mg/dL — ABNORMAL LOW (ref 8.9–10.3)
Chloride: 112 mmol/L — ABNORMAL HIGH (ref 98–111)
Creatinine, Ser: 0.57 mg/dL — ABNORMAL LOW (ref 0.61–1.24)
GFR, Estimated: >60 mL/min (ref >60)
Glucose, Bld: 94 mg/dL (ref 70–99)
Potassium: 3.6 mmol/L (ref 3.5–5.1)
Sodium: 143 mmol/L (ref 135–145)
Total Bilirubin: 0.7 mg/dL (ref 0.3–1.2)
Total Protein: 5.4 g/dL — ABNORMAL LOW (ref 6.5–8.1)

## 2022-03-11 LAB — URINE CULTURE: Culture: NO GROWTH

## 2022-03-11 LAB — MAGNESIUM: Magnesium: 2.3 mg/dL (ref 1.7–2.4)

## 2022-03-11 LAB — VITAMIN B12: Vitamin B-12: 216 pg/mL (ref 180–914)

## 2022-03-11 MED ORDER — ENOXAPARIN SODIUM 40 MG/0.4ML IJ SOSY
40.0000 mg | PREFILLED_SYRINGE | INTRAMUSCULAR | Status: DC
  Administered 2022-03-11 – 2022-03-13 (×3): 40 mg via SUBCUTANEOUS
  Filled 2022-03-11 (×3): qty 0.4

## 2022-03-11 MED ORDER — CYANOCOBALAMIN 1000 MCG/ML IJ SOLN
1000.0000 ug | Freq: Once | INTRAMUSCULAR | Status: AC
  Administered 2022-03-11: 1000 ug via INTRAMUSCULAR
  Filled 2022-03-11: qty 1

## 2022-03-11 NOTE — Progress Notes (Signed)
PROGRESS NOTE    Micheal [name]  [id number] DOB: 06/20/1956 DOA: 03/09/2022 PCP: System, Provider Not In   Brief Narrative:  66 year old male with history of frontotemporal dementia, chronic subdural hematoma presented with altered mental status from memory care facility.  Patient is reportedly nearly nonverbal at baseline, occasionally answering yes or no questions.  Patient appeared more confused to the facility staff over the last day prior to presentation and was appearing more agitated than normal.  On presentation, blood work was mostly unremarkable UA was negative.  Chest x-ray showed no acute cardiopulmonary process.  CT of the head showed interval decrease in size of chronic 6 mm left subdural hematoma, small acute component could not be excluded.  Neurosurgery on-call was consulted by ED provider: Neurosurgery recommended no neurosurgical intervention and repeat CT of the head in the morning.  Assessment & Plan:   Acute possibly metabolic encephalopathy History of frontotemporal dementia Chronic subdural hematoma -Patient apparently was more agitated compared to baseline as per facility staff. -Imaging CT of the head was as above.  On-call neurosurgery recommended no neurosurgical intervention and repeat CT of the head in the morning. -Repeat CT of the head on 03/10/2022 showed no significant intracranial mass effect with findings stable compared to yesterday. -Monitor mental status.  Fall precautions.  PT eval. -No evidence of any kind of infection.  Blood work essentially unremarkable.  Chest x-ray and UA negative for infection as well. -Continue benztropine -Palliative care consultation for goals of care discussion -Diet as per SLP recommendations -Patient's sister does not want patient to return back to current memory care facility.  TOC following.  Possible vitamin B12 deficiency -Vitamin B12 216 which is at the lower limit of normal.  Will start  supplementation.  Dehydration -Treated with IV fluids and subsequently discussed.  Encourage oral intake  Thrombocytopenia -Questionable cause.  Improving.  Monitor intermittently.  No signs of bleeding   DVT prophylaxis: Start Lovenox Code Status: DNR Family Communication: Sister on phone 03/10/22 Disposition Plan: Status is: inpatient because: Need for PT eval. need for memory care facility placement    Consultants: ED provider discussed with neurosurgery on-call on 03/09/2022  Procedures: None  Antimicrobials: None   Subjective: Patient seen and examined at bedside.  No agitation, fever, vomiting or seizures reported by nursing staff. Objective: Vitals:   03/10/22 1320 03/10/22 2015 03/11/22 0346 03/11/22 0548  BP:  (!) 163/99  112/68  Pulse:  67  (!) 50  Resp:  16  16  Temp: 97.7 F (36.5 C) (!) 97.5 F (36.4 C)  98.2 F (36.8 C)  TempSrc: Axillary Oral  Oral  SpO2:  99%  100%  Weight:   60.8 kg   Height:        Intake/Output Summary (Last 24 hours) at 03/11/2022 0746 Last data filed at 03/11/2022 [id number] Gross per 24 hour  Intake 1183.6 ml  Output 3650 ml  Net -2466.4 ml    Filed Weights   03/09/22 2156 03/10/22 0500 03/11/22 0346  Weight: 62.5 kg 62.5 kg 60.8 kg    Examination:  General: On room air.  No distress.  Chronically ill looking and deconditioned. ENT/neck: No thyromegaly.  JVD is not elevated  respiratory: Decreased breath sounds at bases bilaterally with some crackles; no wheezing CVS: S1-S2 heard, rate controlled Abdominal: Soft, nontender, slightly distended; no organomegaly, normal bowel sounds are heard Extremities: Trace lower extremity edema; no cyanosis  CNS: Sleepy, wakes up slightly; nonverbal.  No focal neurologic deficit.  Moves  extremities Lymph: No obvious lymphadenopathy Skin: No obvious ecchymosis/lesions  psych: Could not be assessed because of mental status  musculoskeletal: No obvious joint swelling/deformity    Data  Reviewed: I have personally reviewed following labs and imaging studies  CBC: Recent Labs  Lab 03/09/22 1730 03/10/22 0827 03/11/22 0509  WBC 5.9 4.5 6.0  NEUTROABS 3.6 2.6 3.5  HGB 12.5* 13.1 13.1  HCT 38.3* 39.8 39.8  MCV 100.8* 100.5* 99.7  PLT 146* 120* 147*    Basic Metabolic Panel: Recent Labs  Lab 03/09/22 1730 03/10/22 0517 03/11/22 0509  NA 144 142 143  K 3.9 3.5 3.6  CL 109 109 112*  CO2 31 28 27   GLUCOSE 90 68* 94  BUN 20 14 14   CREATININE 0.68 0.59* 0.57*  CALCIUM 8.9 8.6* 8.6*  MG 2.3 2.4 2.3    GFR: Estimated Creatinine Clearance: 79.2 mL/min (A) (by C-G formula based on SCr of 0.57 mg/dL (L)). Liver Function Tests: Recent Labs  Lab 03/09/22 1730 03/10/22 0517 03/11/22 0509  AST 18 18 16   ALT 16 14 14   ALKPHOS 57 51 57  BILITOT 0.8 0.9 0.7  PROT 5.9* 5.5* 5.4*  ALBUMIN 3.6 3.3* 3.2*    No results for input(s): LIPASE, AMYLASE in the last 168 hours. Recent Labs  Lab 03/09/22 1730  AMMONIA 13    Coagulation Profile: Recent Labs  Lab 03/10/22 0517  INR 1.1    Cardiac Enzymes: No results for input(s): CKTOTAL, CKMB, CKMBINDEX, TROPONINI in the last 168 hours. BNP (last 3 results) No results for input(s): PROBNP in the last 8760 hours. HbA1C: No results for input(s): HGBA1C in the last 72 hours. CBG: Recent Labs  Lab 03/09/22 1605  GLUCAP 82    Lipid Profile: No results for input(s): CHOL, HDL, LDLCALC, TRIG, CHOLHDL, LDLDIRECT in the last 72 hours. Thyroid Function Tests: Recent Labs    03/09/22 2233  TSH 1.302    Anemia Panel: Recent Labs    03/11/22 0509  VITAMINB12 216   Sepsis Labs: Recent Labs  Lab 03/09/22 1730  LATICACIDVEN 1.2     Recent Results (from the past 240 hour(s))  MRSA Next Gen by PCR, Nasal     Status: Abnormal   Collection Time: 03/10/22 10:27 AM   Specimen: Nasal Mucosa; Nasal Swab  Result Value Ref Range Status   MRSA by PCR Next Gen DETECTED (A) NOT DETECTED Final    Comment:  (NOTE) The GeneXpert MRSA Assay (FDA approved for NASAL specimens only), is one component of a comprehensive MRSA colonization surveillance program. It is not intended to diagnose MRSA infection nor to guide or monitor treatment for MRSA infections. Test performance is not FDA approved in patients less than 80 years old. Performed at [facility], [place] [address] [place], [place] [address]          Radiology Studies: CT HEAD WO CONTRAST (5MM)  Result Date: 03/10/2022 CLINICAL DATA:  66 year old male with altered mental status and small left subdural hematoma. EXAM: CT HEAD WITHOUT CONTRAST TECHNIQUE: Contiguous axial images were obtained from the base of the skull through the vertex without intravenous contrast. RADIATION DOSE REDUCTION: This exam was performed according to the departmental dose-optimization program which includes automated exposure control, adjustment of the mA and/or kV according to patient size and/or use of iterative reconstruction technique. COMPARISON:  Head CT 03/09/2022 and earlier. FINDINGS: Brain: Small mixed density left subdural hematoma is stable, 4 mm or less throughout the left hemisphere. This was  up to 15 mm in April. There is trace rightward midline shift. No new intracranial hemorrhage identified. Stable gray-white matter differentiation throughout the brain. No cortically based acute infarct identified. No ventriculomegaly. Normal basilar cisterns. Vascular: Mild Calcified atherosclerosis at the skull base. Skull: Stable, intact. Sinuses/Orbits: Visualized paranasal sinuses and mastoids are stable and well aerated. Other: No acute orbit or scalp soft tissue finding. IMPRESSION: 1. Significantly regressed since April mixed density left side subdural hematoma, stable from yesterday. No significant intracranial mass effect. 2. No new intracranial abnormality. Electronically Signed   By: Genevie Ann M.D.   On: 03/10/2022 09:19   CT Head Wo  Contrast  Result Date: 03/09/2022 CLINICAL DATA:  Altered mental status, nontraumatic (Ped 0-17y) EXAM: CT HEAD WITHOUT CONTRAST TECHNIQUE: Contiguous axial images were obtained from the base of the skull through the vertex without intravenous contrast. RADIATION DOSE REDUCTION: This exam was performed according to the departmental dose-optimization program which includes automated exposure control, adjustment of the mA and/or kV according to patient size and/or use of iterative reconstruction technique. COMPARISON:  CT head 01/18/2022, CT head 12/06/2021, CT head 03/01/2020 BRAIN: BRAIN Cerebral ventricle sizes are concordant with the degree of cerebral volume loss. Patchy and confluent areas of decreased attenuation are noted throughout the deep and periventricular white matter of the cerebral hemispheres bilaterally, compatible with chronic microvascular ischemic disease. No evidence of large-territorial acute infarction. No parenchymal hemorrhage. No mass lesion. Interval decrease in size of a slightly heterogeneous chronic 6 mm left subdural hematoma (5:45). Similar-appearing chronic calcifications along the left centrum semiovale. Interval decrease in 2 mm left-to-right midline shift (from 4 mm). No mass effect or midline shift. No hydrocephalus. Basilar cisterns are patent. Vascular: No hyperdense vessel. Skull: No acute fracture or focal lesion. Sinuses/Orbits: Paranasal sinuses and mastoid air cells are clear. The orbits are unremarkable. Other: None. IMPRESSION: Interval decrease in size of a slightly heterogeneous chronic 6 mm left subdural hematoma. Small underlying acute component is not excluded. Interval decrease in 2 mm left-to-right midline shift Electronically Signed   By: Iven Finn M.D.   On: 03/09/2022 17:09   DG Chest Portable 1 View  Result Date: 03/09/2022 CLINICAL DATA:  Altered mental status. EXAM: PORTABLE CHEST 1 VIEW COMPARISON:  Radiograph and CT 01/18/2022 FINDINGS: Resolved  right basilar opacity from prior exams. No radiographic residual. No new or acute airspace disease. Stable heart size and mediastinal contours. No pulmonary edema, pleural effusion, or pneumothorax. On limited assessment, no acute osseous abnormalities are seen. IMPRESSION: No acute chest findings. Resolved right basilar opacity from prior exams. Electronically Signed   By: Keith Rake M.D.   On: 03/09/2022 17:02        Scheduled Meds:  benztropine  1 mg Oral BID   Chlorhexidine Gluconate Cloth  6 each Topical [id number]   folic acid  1 mg Oral Daily   mupirocin ointment  1 application. Nasal BID   Continuous Infusions:        Aline August, MD [facility] 03/11/2022, 7:46 AM

## 2022-03-11 NOTE — TOC Progression Note (Signed)
Transition of Care [facility]) - Progression Note    Patient Details  Name: Micheal [name] MRN: [id number] Date of Birth: [birth year]  Transition of Care [facility]) CM/SW Contact  Welles Walthall, Juliann Pulse, RN Phone Number: 03/11/2022, 3:46 PM  Clinical Narrative:  [facility] S[facility] Melissa-will confirm tomorrow since power is out currently-able to accept.     Expected Discharge Plan: Assisted Living Barriers to Discharge: Continued Medical Work up  Expected Discharge Plan and Services Expected Discharge Plan: Assisted Living     Post Acute Care Choice: [place] arrangements for the past 2 months: Assisted Living Facility                                       Social Determinants of Health (SDOH) Interventions    Readmission Risk Interventions     View : No data to display.

## 2022-03-11 NOTE — Progress Notes (Addendum)
PT Cancellation Note  Patient Details Name: Micheal [name] MRN: [id number] DOB: [birth year]   Cancelled Treatment:    Reason Eval/Treat Not Completed: Other (comment) Daughter present and requests PT to wait to work with pt until after new ALF evaluation.   Addend 1415 - RN reports pt not following commands at this time and states not a good time for PT  Kati L Payson 03/11/2022, 11:53 AM Thomasene Mohair PT, DPT Acute Rehabilitation Services Pager: [phone] Office: [phone]

## 2022-03-11 NOTE — Consult Note (Addendum)
Consultation Note Date: 03/11/2022   Patient Name: Micheal [name]  DOB: January 26, 1956  MRN: [id number]  Age / Sex: 66 y.o., male  PCP: System, Provider Not In Referring Physician: Aline August, MD  Reason for Consultation: Establishing goals of care  HPI/Patient Profile: 66 y.o. male  with past medical history of hypertension, depressive disorder, documented to be nonverbal at baseline, long-term resident of [place] admitted on 03/09/2022 with encephalopathy.  Workup revealed continued chronic subdural, dehydration, and anemia.  Palliative consulted for goals of care.  Clinical Assessment and Goals of Care: Palliative care consult received.  Chart reviewed including personal review of pertinent labs and imaging.  I know Mr. Sedor and his sister/HCPOA, Jackelyn Poling, from prior admission.  I met with Jackelyn Poling today shortly after he was evaluated for another assisted living facility.  Debbie understands that Leroy Sea that he has long-term, terminal illness with his advanced dementia.  She is familiar with difference between palliative care and hospice care.  At this time, she is very much wanting to work on placement at a different assisted living facility.  She has been in contact with a new physician who is invested in trying to work to see if a regimen other than haldol may be effective for Brad.   SUMMARY OF RECOMMENDATIONS   -DNR/DNI -Primary need at this time is for placement.  -While Leroy Sea is noted to have decreased appetite and functional status in the hospital, in the community he has been maintaining his nutrition and remains ambulatory.  His is sister is not interested in hospice services at this point and, with his reported nutrition and functional status at baseline, I do not think that he is likely to qualify for hospice services long term.  Discharge Planning: To be determined- Sister hopeful for bed at  [facility]     Primary Diagnoses: Present on Admission:  Acute encephalopathy  Chronic subdural hematoma (HCC)  Dehydration  Anemia of chronic disease   I have reviewed the medical record, interviewed the patient and family, and examined the patient. The following aspects are pertinent.  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    Other frontotemporal neurocognitive disorder [facility])    Social History   Socioeconomic History   Marital status: Widowed    Spouse name: Not on file   Number of children: Not on file   Years of education: Not on file   Highest education level: Not on file  Occupational History   Not on file  Tobacco Use   Smoking status: Never   Smokeless tobacco: Never  Vaping Use   Vaping Use: Never used  Substance and Sexual Activity   Alcohol use: Never   Drug use: Never   Sexual activity: Not on file  Other Topics Concern  Not on file  Social History Narrative   Pt is right handed   Lives in 2 story home with his wife, Tamela Oddi   Has 2 adult children   Bachelors Degree in IT security   Currently working as Art therapist with [employer] Strain: Not on file  Food Insecurity: Not on file  Transportation Needs: Not on file  Physical Activity: Not on file  Stress: Not on file  Social Connections: Not on file   Family History  Problem Relation Age of Onset   Dementia Father    Parkinsonism Father    Dementia Paternal Aunt    Dementia Paternal Uncle    Dementia Paternal Grandfather    Scheduled Meds:  benztropine  1 mg Oral BID   Chlorhexidine Gluconate Cloth  6 each Topical Q0600   enoxaparin (LOVENOX) injection  40 mg Subcutaneous [id number]    folic acid  1 mg Oral Daily   mupirocin ointment  1 application. Nasal BID   Continuous Infusions:   PRN Meds:.acetaminophen **OR** acetaminophen Medications Prior to Admission:  Prior to Admission medications   Medication Sig Start Date End Date Taking? Authorizing Provider  acetaminophen (TYLENOL) 325 MG tablet Take 2 tablets (650 mg total) by mouth every 4 (four) hours as needed for mild pain (or temp > 37.5 C (99.5 F)). 08/14/21  Yes Cherene Altes, MD  acetaminophen (TYLENOL) 500 MG tablet Take 500 mg by mouth every 6 (six) hours as needed for headache, fever or mild pain.   Yes [provider]  alum & mag hydroxide-simeth (MAALOX/MYLANTA) 200-200-20 MG/5ML suspension Take 30 mLs by mouth every 6 (six) hours as needed for indigestion or heartburn.   Yes [provider]  benztropine (COGENTIN) 1 MG tablet Take 1 tablet (1 mg total) by mouth 2 (two) times daily. 08/14/21  Yes Cherene Altes, MD  guaifenesin (ROBITUSSIN) 100 MG/5ML syrup Take 200 mg by mouth every 6 (six) hours as needed for cough.   Yes [provider]  haloperidol decanoate (HALDOL DECANOATE) 50 MG/ML injection Inject 50 mg into the muscle every 28 (twenty-eight) days.   Yes [provider]  loperamide (IMODIUM) 2 MG capsule Take 2 mg by mouth as needed for diarrhea or loose stools (max 8 doses/ 24 hours).   Yes [provider]  magnesium hydroxide (MILK OF MAGNESIA) 400 MG/5ML suspension Take 30 mLs by mouth at bedtime as needed (constipation).   Yes [provider]  Neomycin-Bacitracin-Polymyxin (TRIPLE ANTIBIOTIC) [phone] OINT Apply 1 application topically daily as needed (minor skin tears/abrasians).   Yes [provider]  NON FORMULARY Take 1 Can by mouth 3 (three) times daily. Mighty Shakes   Yes [provider]  nystatin (MYCOSTATIN/NYSTOP) powder Apply 1 application topically 2 (two) times daily.   Yes [provider]   polyethylene glycol (MIRALAX / GLYCOLAX) 17 g packet Take 17 g by mouth daily as needed for mild constipation. Patient taking differently: Take 17 g by mouth daily as needed (constipation). 03/07/20  Yes Domenic Polite, MD  clopidogrel (PLAVIX) 75 MG tablet Take 75 mg by mouth daily. Patient not taking: Reported on 01/18/2022    [provider]   No Known Allergies Review of Systems Unable to obtain  Physical Exam General: Alert, awake, in no acute distress.  Minimal interaction. HEENT: No bruits, no goiter, no JVD Heart: Regular rate and rhythm. No murmur appreciated. Lungs: Good air movement, clear Abdomen:  Soft, nontender, nondistended, positive bowel sounds.   Ext: No significant edema Skin: Warm and dry Neuro: Grossly intact, nonfocal.   Vital Signs: BP 128/89 (BP Location: Right Arm)   Pulse 74   Temp 97.6 F (36.4 C) (Oral)   Resp 18   Ht 6' (1.829 m)   Wt 60.8 kg   SpO2 99%   BMI 18.18 kg/m  Pain Scale: 0-10   Pain Score: Asleep   SpO2: SpO2: 99 % O2 Device:SpO2: 99 % O2 Flow Rate: .O2 Flow Rate (L/min): 2 L/min  IO: Intake/output summary:  Intake/Output Summary (Last 24 hours) at 03/11/2022 1609 Last data filed at 03/11/2022 1500 Gross per 24 hour  Intake 1368.05 ml  Output 2925 ml  Net -1556.95 ml     LBM: Last BM Date : 03/11/22 Baseline Weight: Weight: 62.5 kg Most recent weight: Weight: 60.8 kg     Palliative Assessment/Data:   Flowsheet Rows    Flowsheet Row Most Recent Value  Intake Tab   Referral Department Hospitalist  Unit at Time of Referral Med/Surg Unit  Palliative Care Primary Diagnosis Neurology  Date Notified 03/10/22  Palliative Care Type Return patient Palliative Care  Reason for referral Clarify Goals of Care  Date of Admission 03/09/22  Date first seen by Palliative Care 03/11/22  # of days Palliative referral response time 1 Day(s)  # of days IP prior to Palliative referral 1  Clinical Assessment   Palliative  Performance Scale Score 40%  Psychosocial & Spiritual Assessment   Palliative Care Outcomes   Patient/Family meeting held? Yes  Who was at the meeting? Sister, Wyman Songster       Time In: 1500 Time Out: 1550 Time Total: 69  Signed by: Micheline Rough, MD   Please contact Palliative Medicine Team phone at [phone] for questions and concerns.  For individual provider: See Shea Evans

## 2022-03-12 DIAGNOSIS — E538 Deficiency of other specified B group vitamins: Secondary | ICD-10-CM | POA: Diagnosis not present

## 2022-03-12 DIAGNOSIS — I6203 Nontraumatic chronic subdural hemorrhage: Secondary | ICD-10-CM | POA: Diagnosis not present

## 2022-03-12 DIAGNOSIS — G9340 Encephalopathy, unspecified: Secondary | ICD-10-CM | POA: Diagnosis not present

## 2022-03-12 DIAGNOSIS — D638 Anemia in other chronic diseases classified elsewhere: Secondary | ICD-10-CM | POA: Diagnosis not present

## 2022-03-12 MED ORDER — FOLIC ACID 1 MG PO TABS: 1.0000 mg | ORAL_TABLET | Freq: Every day | ORAL | 0 refills | Status: AC

## 2022-03-12 MED ORDER — CYANOCOBALAMIN 1000 MCG/ML IJ SOLN
1000.0000 ug | Freq: Every day | INTRAMUSCULAR | Status: DC
  Administered 2022-03-12 – 2022-03-13 (×2): 1000 ug via INTRAMUSCULAR
  Filled 2022-03-12 (×2): qty 1

## 2022-03-12 MED ORDER — VITAMIN B-12 1000 MCG PO TABS: 1000.0000 ug | ORAL_TABLET | Freq: Every day | ORAL | 0 refills | Status: AC

## 2022-03-12 NOTE — TOC Progression Note (Signed)
Transition of Care [facility]) - Progression Note    Patient Details  Name: Micheal [name] MRN: [id number] Date of Birth: 04/22/1956  Transition of Care [facility]) CM/SW Contact  Golda Acre, RN Phone Number: 03/12/2022, 1:32 PM  Clinical Narrative:    [name] at [facility].  Financial power of attorney has not signed papers yet.  Pt can not come until this is done.  Does expect them to call back or come today.   Expected Discharge Plan: Assisted Living Barriers to Discharge: Continued Medical Work up  Expected Discharge Plan and Services Expected Discharge Plan: Assisted Living     Post Acute Care Choice: Home Health Living arrangements for the past 2 months: Assisted Living Facility Expected Discharge Date: 03/12/22                                     Social Determinants of Health (SDOH) Interventions    Readmission Risk Interventions     View : No data to display.

## 2022-03-12 NOTE — Discharge Summary (Signed)
Physician Discharge Summary  Micheal [name] KGY:[id number] DOB: 1956/10/03 DOA: 03/09/2022  PCP: System, Provider Not In  Admit date: 03/09/2022 Discharge date: 03/13/2022  Admitted From: Memory care facility Disposition: Memory care facility  Recommendations for Outpatient Follow-up:  Follow up with PCP in 1 week  Recommend outpatient follow-up with palliative care Follow up in ED if symptoms worsen or new appear   Home Health: No Equipment/Devices: None  Discharge Condition: Stable CODE STATUS: DNR Diet recommendation: Heart healthy/diet as per SLP recommendations Regular;Thin liquid    Liquid Administration via: Cup;Straw Medication Administration: Crushed with puree Supervision: Patient able to self feed;Full supervision/cueing for compensatory strategies Postural Changes: Seated upright at 90 degrees   Brief/Interim Summary: 66 year old male with history of frontotemporal dementia, chronic subdural hematoma presented with altered mental status from memory care facility.  Patient is reportedly nearly nonverbal at baseline, occasionally answering yes or no questions.  Patient appeared more confused to the facility staff over the last day prior to presentation and was appearing more agitated than normal.  On presentation, blood work was mostly unremarkable UA was negative.  Chest x-ray showed no acute cardiopulmonary process.  CT of the head showed interval decrease in size of chronic 6 mm left subdural hematoma, small acute component could not be excluded.  Neurosurgery on-call was consulted by ED provider: Neurosurgery recommended no neurosurgical intervention and repeat CT of the head in the morning.  Repeat CT showed stable findings.  Subsequently, patient has remained stable.  Patient's sister did not want the patient to return back to the prior memory care facility.  Patient has been accepted into a different memory care facility.  He will be discharged today.  Discharge Diagnoses:    Acute possibly metabolic encephalopathy History of frontotemporal dementia Chronic subdural hematoma -Patient apparently was more agitated compared to baseline as per facility staff. -Imaging CT of the head was as above.  On-call neurosurgery recommended no neurosurgical intervention and repeat CT of the head in the morning. -Repeat CT of the head on 03/10/2022 showed no significant intracranial mass effect with findings stable compared to yesterday. -Monitor mental status.  Fall precautions.  PT eval. -No evidence of any kind of infection.  Blood work essentially unremarkable.  Chest x-ray and UA negative for infection as well. -Continue benztropine -Palliative care evaluation appreciated.  Patient's sister is not interested in hospice services at this point. -Diet as per SLP recommendations -Patient's sister does not want patient to return back to current memory care facility.   -patient has been accepted into a different memory care facility.  He will be discharged today. -Empiric folic acid will be continued   Possible vitamin B12 deficiency -Vitamin B12 216 which is at the lower limit of normal.  Continue supplementation on discharge as well.   Dehydration -Treated with IV fluids and subsequently discussed.  Encourage oral intake  Thrombocytopenia -Questionable cause.  Improving.  Monitor intermittently.  No signs of bleeding   Discharge Instructions  Discharge Instructions     Diet - low sodium heart healthy   Complete by: As directed    Increase activity slowly   Complete by: As directed       Allergies as of 03/13/2022   No Known Allergies      Medication List     STOP taking these medications    haloperidol decanoate 50 MG/ML injection Commonly known as: HALDOL DECANOATE       TAKE these medications    acetaminophen 325 MG tablet Commonly known  as: TYLENOL Take 2 tablets (650 mg total) by mouth every 4 (four) hours as needed for mild pain (or temp >  37.5 C (99.5 F)).   benztropine 1 MG tablet Commonly known as: COGENTIN Take 1 tablet (1 mg total) by mouth 2 (two) times daily.   folic acid 1 MG tablet Commonly known as: FOLVITE Take 1 tablet (1 mg total) by mouth daily.   guaifenesin 100 MG/5ML syrup Commonly known as: ROBITUSSIN Take 200 mg by mouth every 6 (six) hours as needed for cough.   loperamide 2 MG capsule Commonly known as: IMODIUM Take 2 mg by mouth as needed for diarrhea or loose stools (max 8 doses/ 24 hours).   neomycin-bacitracin-polymyxin [id number] Oint Apply 1 application topically daily as needed (minor skin tears/abrasians).   NON FORMULARY Take 1 Can by mouth 3 (three) times daily. Mighty Shakes   polyethylene glycol 17 g packet Commonly known as: MIRALAX / GLYCOLAX Take 17 g by mouth daily as needed for mild constipation. What changed: reasons to take this   vitamin B-12 1000 MCG tablet Commonly known as: CYANOCOBALAMIN Take 1 tablet (1,000 mcg total) by mouth daily.        Follow-up Information     PCP. Schedule an appointment as soon as possible for a visit in 1 week(s).                 No Known Allergies  Consultations: Palliative care.  ED provider discussed with neurosurgery on-call on 03/09/2022   Procedures/Studies: CT HEAD WO CONTRAST ( )  Result Date: 03/10/2022 CLINICAL DATA:  66 year old male with altered mental status and small left subdural hematoma. EXAM: CT HEAD WITHOUT CONTRAST TECHNIQUE: Contiguous axial images were obtained from the base of the skull through the vertex without intravenous contrast. RADIATION DOSE REDUCTION: This exam was performed according to the departmental dose-optimization program which includes automated exposure control, adjustment of the mA and/or kV according to patient size and/or use of iterative reconstruction technique. COMPARISON:  Head CT 03/09/2022 and earlier. FINDINGS: Brain: Small mixed density left subdural hematoma is stable, 4  mm or less throughout the left hemisphere. This was up to 15 mm in April. There is trace rightward midline shift. No new intracranial hemorrhage identified. Stable gray-white matter differentiation throughout the brain. No cortically based acute infarct identified. No ventriculomegaly. Normal basilar cisterns. Vascular: Mild Calcified atherosclerosis at the skull base. Skull: Stable, intact. Sinuses/Orbits: Visualized paranasal sinuses and mastoids are stable and well aerated. Other: No acute orbit or scalp soft tissue finding. IMPRESSION: 1. Significantly regressed since April mixed density left side subdural hematoma, stable from yesterday. No significant intracranial mass effect. 2. No new intracranial abnormality. Electronically Signed   By: Odessa Fleming M.D.   On: 03/10/2022 09:19   CT Head Wo Contrast  Result Date: 03/09/2022 CLINICAL DATA:  Altered mental status, nontraumatic (Ped 0-17y) EXAM: CT HEAD WITHOUT CONTRAST TECHNIQUE: Contiguous axial images were obtained from the base of the skull through the vertex without intravenous contrast. RADIATION DOSE REDUCTION: This exam was performed according to the departmental dose-optimization program which includes automated exposure control, adjustment of the mA and/or kV according to patient size and/or use of iterative reconstruction technique. COMPARISON:  CT head 01/18/2022, CT head 12/06/2021, CT head 03/01/2020 BRAIN: BRAIN Cerebral ventricle sizes are concordant with the degree of cerebral volume loss. Patchy and confluent areas of decreased attenuation are noted throughout the deep and periventricular white matter of the cerebral hemispheres bilaterally, compatible with chronic microvascular  ischemic disease. No evidence of large-territorial acute infarction. No parenchymal hemorrhage. No mass lesion. Interval decrease in size of a slightly heterogeneous chronic 6 mm left subdural hematoma (5:45). Similar-appearing chronic calcifications along the left  centrum semiovale. Interval decrease in 2 mm left-to-right midline shift (from 4 mm). No mass effect or midline shift. No hydrocephalus. Basilar cisterns are patent. Vascular: No hyperdense vessel. Skull: No acute fracture or focal lesion. Sinuses/Orbits: Paranasal sinuses and mastoid air cells are clear. The orbits are unremarkable. Other: None. IMPRESSION: Interval decrease in size of a slightly heterogeneous chronic 6 mm left subdural hematoma. Small underlying acute component is not excluded. Interval decrease in 2 mm left-to-right midline shift Electronically Signed   By: Tish Frederickson M.D.   On: 03/09/2022 17:09   DG Chest Portable 1 View  Result Date: 03/09/2022 CLINICAL DATA:  Altered mental status. EXAM: PORTABLE CHEST 1 VIEW COMPARISON:  Radiograph and CT 01/18/2022 FINDINGS: Resolved right basilar opacity from prior exams. No radiographic residual. No new or acute airspace disease. Stable heart size and mediastinal contours. No pulmonary edema, pleural effusion, or pneumothorax. On limited assessment, no acute osseous abnormalities are seen. IMPRESSION: No acute chest findings. Resolved right basilar opacity from prior exams. Electronically Signed   By: Narda Rutherford M.D.   On: 03/09/2022 17:02      Subjective: Patient seen and examined at bedside.  No fever, seizures, agitation reported.  Oral intake is poor as per nursing staff.    Discharge Exam: Vitals:   03/12/22 2026 03/13/22 0553  BP: (!) 141/89 114/78  Pulse: 73 63  Resp: 18 18  Temp: 98.1 F (36.7 C) 97.6 F (36.4 C)  SpO2: 94% 100%    General: On room air.  No distress.  Looks chronically deconditioned.  Confused, very slow to respond.  Hardly participates in any conversation. respiratory: Decreased breath sounds at bases bilaterally with some crackles CVS: Currently rate controlled; S1-S2 heard  abdominal: Soft, nontender, distended mildly, no organomegaly; bowel sounds heard  extremities: Mild lower extremity  edema; no clubbing.      The results of significant diagnostics from this hospitalization (including imaging, microbiology, ancillary and laboratory) are listed below for reference.     Microbiology: Recent Results (from the past 240 hour(s))  Urine Culture     Status: None   Collection Time: 03/09/22  7:00 PM   Specimen: Urine, Clean Catch  Result Value Ref Range Status   Specimen Description   Final    URINE, CLEAN CATCH Performed at [facility], [address], [place], [place] [address]    Special Requests   Final    NONE Performed at [facility], [address], [place], [place] [address]    Culture   Final    NO GROWTH Performed at [facility], [address], [place], [place] [address]    Report Status 03/11/2022 FINAL  Final  MRSA Next Gen by PCR, Nasal     Status: Abnormal   Collection Time: 03/10/22 10:27 AM   Specimen: Nasal Mucosa; Nasal Swab  Result Value Ref Range Status   MRSA by PCR Next Gen DETECTED (A) NOT DETECTED Final    Comment: (NOTE) The GeneXpert MRSA Assay (FDA approved for NASAL specimens only), is one component of a comprehensive MRSA colonization surveillance program. It is not intended to diagnose MRSA infection nor to guide or monitor treatment for MRSA infections. Test performance is not FDA approved in patients less than 2  years old. Performed at [facility], [address] [place], [place] [address]      Labs: BNP (last 3 results) Recent Labs    01/18/22 2000  BNP 52.4   Basic Metabolic Panel: Recent Labs  Lab 03/09/22 1730 03/10/22 0517 03/11/22 0509  NA 144 142 143  K 3.9 3.5 3.6  CL 109 109 112*  CO2 31 28 27   GLUCOSE 90 68* 94  BUN 20 14 14   CREATININE 0.68 0.59* 0.57*  CALCIUM 8.9 8.6* 8.6*  MG 2.3 2.4 2.3   Liver Function Tests: Recent Labs  Lab 03/09/22 1730 03/10/22 0517 03/11/22 0509  AST 18 18 16   ALT 16 14 14   ALKPHOS 57 51 57   BILITOT 0.8 0.9 0.7  PROT 5.9* 5.5* 5.4*  ALBUMIN 3.6 3.3* 3.2*   No results for input(s): LIPASE, AMYLASE in the last 168 hours. Recent Labs  Lab 03/09/22 1730  AMMONIA 13   CBC: Recent Labs  Lab 03/09/22 1730 03/10/22 0827 03/11/22 0509  WBC 5.9 4.5 6.0  NEUTROABS 3.6 2.6 3.5  HGB 12.5* 13.1 13.1  HCT 38.3* 39.8 39.8  MCV 100.8* 100.5* 99.7  PLT 146* 120* 147*   Cardiac Enzymes: No results for input(s): CKTOTAL, CKMB, CKMBINDEX, TROPONINI in the last 168 hours. BNP: Invalid input(s): POCBNP CBG: Recent Labs  Lab 03/09/22 1605  GLUCAP 82   D-Dimer No results for input(s): DDIMER in the last 72 hours. Hgb A1c No results for input(s): HGBA1C in the last 72 hours. Lipid Profile No results for input(s): CHOL, HDL, LDLCALC, TRIG, CHOLHDL, LDLDIRECT in the last 72 hours. Thyroid function studies No results for input(s): TSH, T4TOTAL, T3FREE, THYROIDAB in the last 72 hours.  Invalid input(s): FREET3  Anemia work up Recent Labs    03/11/22 0509  VITAMINB12 216   Urinalysis    Component Value Date/Time   COLORURINE YELLOW 03/09/2022 1900   APPEARANCEUR CLEAR 03/09/2022 1900   LABSPEC 1.016 03/09/2022 1900   PHURINE 6.0 03/09/2022 1900   GLUCOSEU NEGATIVE 03/09/2022 1900   HGBUR NEGATIVE 03/09/2022 1900   BILIRUBINUR NEGATIVE 03/09/2022 1900   KETONESUR NEGATIVE 03/09/2022 1900   PROTEINUR NEGATIVE 03/09/2022 1900   NITRITE NEGATIVE 03/09/2022 1900   LEUKOCYTESUR NEGATIVE 03/09/2022 1900   Sepsis Labs Invalid input(s): PROCALCITONIN,  WBC,  LACTICIDVEN Microbiology Recent Results (from the past 240 hour(s))  Urine Culture     Status: None   Collection Time: 03/09/22  7:00 PM   Specimen: Urine, Clean Catch  Result Value Ref Range Status   Specimen Description   Final    URINE, CLEAN CATCH Performed at [facility], [address] [place], [place] [address]    Special Requests   Final    NONE Performed at [facility], [address] [place], [place] [address]    Culture   Final    NO GROWTH Performed at [facility], [address] [place], [place] [address]    Report Status 03/11/2022 FINAL  Final  MRSA Next Gen by PCR, Nasal     Status: Abnormal   Collection Time: 03/10/22 10:27 AM   Specimen: Nasal Mucosa; Nasal Swab  Result Value Ref Range Status   MRSA by PCR Next Gen DETECTED (A) NOT DETECTED Final    Comment: (NOTE) The GeneXpert MRSA Assay (FDA approved for NASAL specimens only), is one component of a comprehensive MRSA colonization surveillance program. It is not intended to diagnose MRSA infection  nor to guide or monitor treatment for MRSA infections. Test performance is not FDA approved in patients less than 90 years old. Performed at [facility], [address], [place], [place] [address]      Time coordinating discharge: 35 minutes  SIGNED:   Albertine Grates, MD  [facility] 03/13/2022, 12:37 PM

## 2022-03-12 NOTE — Progress Notes (Signed)
PROGRESS NOTE    Micheal [name]  LPF:[id number] DOB: [birth year] DOA: 03/09/2022 PCP: System, Provider Not In   Brief Narrative:  66 year old male with history of frontotemporal dementia, chronic subdural hematoma presented with altered mental status from memory care facility.  Patient is reportedly nearly nonverbal at baseline, occasionally answering yes or no questions.  Patient appeared more confused to the facility staff over the last day prior to presentation and was appearing more agitated than normal.  On presentation, blood work was mostly unremarkable UA was negative.  Chest x-ray showed no acute cardiopulmonary process.  CT of the head showed interval decrease in size of chronic 6 mm left subdural hematoma, small acute component could not be excluded.  Neurosurgery on-call was consulted by ED provider: Neurosurgery recommended no neurosurgical intervention and repeat CT of the head in the morning.  Repeat CT showed stable findings.  Assessment & Plan:   Acute possibly metabolic encephalopathy History of frontotemporal dementia Chronic subdural hematoma -Patient apparently was more agitated compared to baseline as per facility staff. -Imaging CT of the head was as above.  On-call neurosurgery recommended no neurosurgical intervention and repeat CT of the head in the morning. -Repeat CT of the head on 03/10/2022 showed no significant intracranial mass effect with findings stable compared to yesterday. -Monitor mental status.  Fall precautions.  PT eval. -No evidence of any kind of infection.  Blood work essentially unremarkable.  Chest x-ray and UA negative for infection as well. -Continue benztropine -Palliative care evaluation appreciated.  Patient's sister is not interested in hospice services at this point. -Diet as per SLP recommendations -Patient's sister does not want patient to return back to current memory care facility.  TOC following.  Possible vitamin B12 deficiency -Vitamin  B12 216 which is at the lower limit of normal.  Continue supplementation.  Dehydration -Treated with IV fluids and subsequently discussed.  Encourage oral intake  Thrombocytopenia -Questionable cause.  Improving.  Monitor intermittently.  No signs of bleeding   DVT prophylaxis: Start Lovenox Code Status: DNR Family Communication: Sister on phone 03/10/22 Disposition Plan: Status is: inpatient because: Need for PT eval. need for memory care facility placement    Consultants: ED provider discussed with neurosurgery on-call on 03/09/2022  Procedures: None  Antimicrobials: None   Subjective: Patient seen and examined at bedside.  No fever, seizures, agitation reported.  Oral intake is poor as per nursing staff.   Objective: Vitals:   03/11/22 1408 03/11/22 2011 03/12/22 0126 03/12/22 0616  BP: 128/89 125/84 122/84 135/76  Pulse: 74 81 71 63  Resp: 18 18 16 16   Temp: 97.6 F (36.4 C) 98.2 F (36.8 C) (!) 97.3 F (36.3 C) (!) 97.3 F (36.3 C)  TempSrc: Oral Oral Oral Oral  SpO2: 99% 99% 97% 100%  Weight:      Height:        Intake/Output Summary (Last 24 hours) at 03/12/2022 0724 Last data filed at 03/12/2022 0400 Gross per 24 hour  Intake 1010 ml  Output 875 ml  Net 135 ml    Filed Weights   03/09/22 2156 03/10/22 0500 03/11/22 0346  Weight: 62.5 kg 62.5 kg 60.8 kg    Examination:  General: On room air.  No distress.  Looks chronically deconditioned.  Confused, very slow to respond.  Hardly participates in any conversation. respiratory: Decreased breath sounds at bases bilaterally with some crackles CVS: Currently rate controlled; S1-S2 heard  abdominal: Soft, nontender, distended mildly, no organomegaly; bowel sounds heard  extremities:  Mild lower extremity edema; no clubbing.       Data Reviewed: I have personally reviewed following labs and imaging studies  CBC: Recent Labs  Lab 03/09/22 1730 03/10/22 0827 03/11/22 0509  WBC 5.9 4.5 6.0  NEUTROABS 3.6  2.6 3.5  HGB 12.5* 13.1 13.1  HCT 38.3* 39.8 39.8  MCV 100.8* 100.5* 99.7  PLT 146* 120* 147*    Basic Metabolic Panel: Recent Labs  Lab 03/09/22 1730 03/10/22 0517 03/11/22 0509  NA 144 142 143  K 3.9 3.5 3.6  CL 109 109 112*  CO2 31 28 27   GLUCOSE 90 68* 94  BUN 20 14 14   CREATININE 0.68 0.59* 0.57*  CALCIUM 8.9 8.6* 8.6*  MG 2.3 2.4 2.3    GFR: Estimated Creatinine Clearance: 79.2 mL/min (A) (by C-G formula based on SCr of 0.57 mg/dL (L)). Liver Function Tests: Recent Labs  Lab 03/09/22 1730 03/10/22 0517 03/11/22 0509  AST 18 18 16   ALT 16 14 14   ALKPHOS 57 51 57  BILITOT 0.8 0.9 0.7  PROT 5.9* 5.5* 5.4*  ALBUMIN 3.6 3.3* 3.2*    No results for input(s): LIPASE, AMYLASE in the last 168 hours. Recent Labs  Lab 03/09/22 1730  AMMONIA 13    Coagulation Profile: Recent Labs  Lab 03/10/22 0517  INR 1.1    Cardiac Enzymes: No results for input(s): CKTOTAL, CKMB, CKMBINDEX, TROPONINI in the last 168 hours. BNP (last 3 results) No results for input(s): PROBNP in the last 8760 hours. HbA1C: No results for input(s): HGBA1C in the last 72 hours. CBG: Recent Labs  Lab 03/09/22 1605  GLUCAP 82    Lipid Profile: No results for input(s): CHOL, HDL, LDLCALC, TRIG, CHOLHDL, LDLDIRECT in the last 72 hours. Thyroid Function Tests: Recent Labs    03/09/22 2233  TSH 1.302    Anemia Panel: Recent Labs    03/11/22 0509  VITAMINB12 216    Sepsis Labs: Recent Labs  Lab 03/09/22 1730  LATICACIDVEN 1.2     Recent Results (from the past 240 hour(s))  Urine Culture     Status: None   Collection Time: 03/09/22  7:00 PM   Specimen: Urine, Clean Catch  Result Value Ref Range Status   Specimen Description   Final    URINE, CLEAN CATCH Performed at [facility], [address] [place], [place] [address]    Special Requests   Final    NONE Performed at [facility], [address] [place], [place]  [address]    Culture   Final    NO GROWTH Performed at [facility], [address] [place], [place] [address]    Report Status 03/11/2022 FINAL  Final  MRSA Next Gen by PCR, Nasal     Status: Abnormal   Collection Time: 03/10/22 10:27 AM   Specimen: Nasal Mucosa; Nasal Swab  Result Value Ref Range Status   MRSA by PCR Next Gen DETECTED (A) NOT DETECTED Final    Comment: (NOTE) The GeneXpert MRSA Assay (FDA approved for NASAL specimens only), is one component of a comprehensive MRSA colonization surveillance program. It is not intended to diagnose MRSA infection nor to guide or monitor treatment for MRSA infections. Test performance is not FDA approved in patients less than 66 years old. Performed at [facility], [address] [place], [place] [address]          Radiology Studies: CT HEAD WO CONTRAST (5MM)  Result Date:  03/10/2022 CLINICAL DATA:  66 year old male with altered mental status and small left subdural hematoma. EXAM: CT HEAD WITHOUT CONTRAST TECHNIQUE: Contiguous axial images were obtained from the base of the skull through the vertex without intravenous contrast. RADIATION DOSE REDUCTION: This exam was performed according to the departmental dose-optimization program which includes automated exposure control, adjustment of the mA and/or kV according to patient size and/or use of iterative reconstruction technique. COMPARISON:  Head CT 03/09/2022 and earlier. FINDINGS: Brain: Small mixed density left subdural hematoma is stable, 4 mm or less throughout the left hemisphere. This was up to 15 mm in April. There is trace rightward midline shift. No new intracranial hemorrhage identified. Stable gray-white matter differentiation throughout the brain. No cortically based acute infarct identified. No ventriculomegaly. Normal basilar cisterns. Vascular: Mild Calcified atherosclerosis at the skull base. Skull: Stable, intact. Sinuses/Orbits: Visualized  paranasal sinuses and mastoids are stable and well aerated. Other: No acute orbit or scalp soft tissue finding. IMPRESSION: 1. Significantly regressed since April mixed density left side subdural hematoma, stable from yesterday. No significant intracranial mass effect. 2. No new intracranial abnormality. Electronically Signed   By: Odessa Fleming M.D.   On: 03/10/2022 09:19        Scheduled Meds:  benztropine  1 mg Oral BID   Chlorhexidine Gluconate Cloth  6 each Topical Q0600   cyanocobalamin  1,000 mcg Intramuscular Q0600   enoxaparin (LOVENOX) injection  40 mg Subcutaneous Q24H   folic acid  1 mg Oral Daily   mupirocin ointment  1 application. Nasal BID   Continuous Infusions:        Glade Lloyd, MD [facility] 03/12/2022, 7:24 AM

## 2022-03-12 NOTE — [place] FL2 (Addendum)
[place] MEDICAID FL2 LEVEL OF CARE SCREENING TOOL     IDENTIFICATION  Patient Name: Micheal [name] Birthdate: 1956-01-25 Sex: male Admission Date (Current Location): 03/09/2022  [facility] and [place] Number:  Producer, television/film/video and Address:  [facility],  [address] [place] [address]      Provider Number: [id number]  Attending Physician Name and Address:  Albertine Grates, MD  Relative Name and Phone Number:       Current Level of Care: Hospital Recommended Level of Care: Memory Care Prior Approval Number:    Date Approved/Denied:   PASRR Number:    Discharge Plan: Other (Comment) (memory care at [facility])    Current Diagnoses: Patient Active Problem List   Diagnosis Date Noted   B12 deficiency 03/11/2022   Chronic subdural hematoma (HCC) 03/10/2022   Dehydration 03/10/2022   Anemia of chronic disease 03/10/2022   Acute encephalopathy 03/09/2022   MRSA UTI 01/25/2022   Hypoglycemia 01/25/2022   Frontotemporal dementia (HCC) 01/23/2022   Acute on chronic intracranial subdural hematoma (HCC) 01/23/2022   History of pulmonary embolus (PE) 01/23/2022   MDD (major depressive disorder) 01/23/2022   Goals of care, counseling/discussion 01/23/2022   Protein-calorie malnutrition, severe 01/21/2022   Sepsis due to pneumonia (HCC) 01/18/2022   Unstable gait 08/06/2021   Weakness    Frontotemporal dementia with behavioral disturbance (HCC) 03/01/2020   Dehydration with hypernatremia 03/01/2020   Essential hypertension 03/01/2020    Orientation RESPIRATION BLADDER Height & Weight        Normal Incontinent Weight: 134 lb 0.6 oz (60.8 kg) Height:  6' (182.9 cm)  BEHAVIORAL SYMPTOMS/MOOD NEUROLOGICAL BOWEL NUTRITION STATUS      Incontinent Diet (regular)  AMBULATORY STATUS COMMUNICATION OF NEEDS Skin   Extensive Assist Verbally Normal                       Personal Care Assistance Level of Assistance  Bathing, Dressing Bathing Assistance:  Limited assistance   Dressing Assistance: Limited assistance     Functional Limitations Info  Sight, Hearing, Speech Sight Info: Adequate Hearing Info: Adequate Speech Info: Adequate    SPECIAL CARE FACTORS FREQUENCY                       Contractures Contractures Info: Not present    Additional Factors Info  Code Status Code Status Info: DNR             Current Medications (03/13/2022):  This is the current hospital active medication list Current Facility-Administered Medications  Medication Dose Route Frequency Provider Last Rate Last Admin   acetaminophen (TYLENOL) tablet 650 mg  650 mg Oral Q6H PRN Howerter, Justin B, DO       Or   acetaminophen (TYLENOL) suppository 650 mg  650 mg Rectal Q6H PRN Howerter, Justin B, DO       benztropine (COGENTIN) tablet 1 mg  1 mg Oral BID Howerter, Justin B, DO   1 mg at 03/13/22 1011   Chlorhexidine Gluconate Cloth 2 % PADS 6 each  6 each Topical Q0600 Glade Lloyd, MD   6 each at 03/13/22 0732   cyanocobalamin ((VITAMIN B-12)) injection 1,000 mcg  1,000 mcg Intramuscular Q0600 Glade Lloyd, MD   1,000 mcg at 03/13/22 1011   enoxaparin (LOVENOX) injection 40 mg  40 mg Subcutaneous Q24H Alekh, Kshitiz, MD   40 mg at 03/13/22 1011   folic acid (FOLVITE) tablet 1 mg  1  mg Oral Daily Glade Lloyd, MD   1 mg at 03/13/22 1011   mupirocin ointment (BACTROBAN) 2 % 1 application.  1 application. Nasal BID Glade Lloyd, MD   1 application. at 03/13/22 1011     Discharge Medications: Please see discharge summary for a list of discharge medications.  Relevant Imaging Results:  Relevant Lab Results:   Additional Information SS# [id number] Pfizer covid vaccination x 2.  Alexiya Franqui, LCSW

## 2022-03-12 NOTE — Care Management Important Message (Signed)
Important Message  Patient Details IM Letter placed in Patients room. Name: Micheal [name] MRN: [id number] Date of Birth: Jul 27, 1956   Medicare Important Message Given:  Yes     Caren Macadam 03/12/2022, 10:42 AM

## 2022-03-12 NOTE — TOC Progression Note (Signed)
Transition of Care [facility]) - Progression Note    Patient Details  Name: Micheal [name] MRN: [id number] Date of Birth: 1955/12/15  [facility]) CM/SW Contact  Golda Acre, RN Phone Number: 03/12/2022, 10:41 AM  Clinical Narrative:    Clovis Cao and cxr faxed to [facility] per request.    Expected Discharge Plan: Assisted Living Barriers to Discharge: Continued Medical Work up  Expected Discharge Plan and Services Expected Discharge Plan: Assisted Living     Post Acute Care Choice: Home Health Living arrangements for the past 2 months: Assisted Living Facility Expected Discharge Date: 03/12/22                                     Social Determinants of Health (SDOH) Interventions    Readmission Risk Interventions     View : No data to display.

## 2022-03-12 NOTE — TOC Progression Note (Signed)
Transition of Care [facility]) - Progression Note    Patient Details  Name: Micheal [name] MRN: [id number] Date of Birth: 12/06/1955  [facility] Care [facility]) CM/SW Contact  Golda Acre, RN Phone Number: 03/12/2022, 8:15 AM  Clinical Narrative:    Will f/u w/Melissa at [facility] care to see if accepted.   Expected Discharge Plan: Assisted Living Barriers to Discharge: Continued Medical Work up  Expected Discharge Plan and Services Expected Discharge Plan: Assisted Living     Post Acute Care Choice: Home Health Living arrangements for the past 2 months: Assisted Living Facility                                       Social Determinants of Health (SDOH) Interventions    Readmission Risk Interventions     View : No data to display.

## 2022-03-12 NOTE — Evaluation (Signed)
Physical Therapy Evaluation Patient Details Name: Micheal [name] MRN: [id number] DOB: 1956/06/20 Today's Date: 03/12/2022  History of Present Illness  66 year old male with history of frontotemporal dementia, chronic subdural hematoma presented with altered mental status from memory care facility.  Patient is reportedly nearly nonverbal at baseline, occasionally answering yes or no questions.  Patient appeared more confused to the facility staff over the last day prior to presentation and was appearing more agitated than normal.  On presentation, blood work was mostly unremarkable UA was negative.  Chest x-ray showed no acute cardiopulmonary process.  CT of the head showed interval decrease in size of chronic 6 mm left subdural hematoma, small acute component could not be excluded.  Neurosurgery on-call was consulted by ED provider: Neurosurgery recommended no neurosurgical intervention and repeat CT of the head in the morning.  Repeat CT showed stable findings.  Clinical Impression  Pt admitted with above diagnosis. Pt did not respond to questions nor to commands, his eyes were open but he did not track visually. He was physically resistant to attempts to stand up, but then later did stand up spontaneously, but had poor standing balance with a posterior lean. He was unable to maintain a standing position. He sat at edge of bed for ~6 minutes without loss of balance. His true functional ability is difficult to ascertain due to his advanced dementia. Noted in prior PT note 01/29/22 that he was able to ambulate 350' with hand held assist of 2 people.  Pt currently with functional limitations due to the deficits listed below (see PT Problem List). Pt will benefit from skilled PT to increase their independence and safety with mobility to allow discharge to the venue listed below.          Recommendations for follow up therapy are one component of a multi-disciplinary discharge planning process, led by the  attending physician.  Recommendations may be updated based on patient status, additional functional criteria and insurance authorization.  Follow Up Recommendations Long-term institutional care without follow-up therapy    Assistance Recommended at Discharge Frequent or constant Supervision/Assistance  Patient can return home with the following  A little help with walking and/or transfers;A lot of help with bathing/dressing/bathroom;Assistance with cooking/housework;Direct supervision/assist for medications management;Assistance with feeding;Assist for transportation;Help with stairs or ramp for entrance;Direct supervision/assist for financial management    Equipment Recommendations None recommended by PT  Recommendations for Other Services       Functional Status Assessment Patient has had a recent decline in their functional status and demonstrates the ability to make significant improvements in function in a reasonable and predictable amount of time.     Precautions / Restrictions Precautions Precautions: Fall Restrictions Weight Bearing Restrictions: No      Mobility  Bed Mobility Overal bed mobility: Needs Assistance Bed Mobility: Supine to Sit, Sit to Supine     Supine to sit: Max assist Sit to supine: Max assist   General bed mobility comments: unclear what pt's true capacity is 2* advanced dementia, he did not respond to commands    Transfers Overall transfer level: Needs assistance Equipment used: None Transfers: Sit to/from Stand Sit to Stand: Mod assist           General transfer comment: mod A for balance in standing 2* posterior lean. Pt did not stand when asked to do so but did stand spontaneously    Ambulation/Gait               General Gait Details: NT-  pt unsteady in standing  Stairs            Wheelchair Mobility    Modified Rankin (Stroke Patients Only)       Balance Overall balance assessment: Needs  assistance Sitting-balance support: Feet supported, No upper extremity supported Sitting balance-Leahy Scale: Good       Standing balance-Leahy Scale: Poor Standing balance comment: posterior lean in standing, pt unable to maintain upright standing position unassisted                             Pertinent Vitals/Pain Pain Assessment Pain Assessment: No/denies pain Breathing: normal Negative Vocalization: none Facial Expression: smiling or inexpressive Body Language: relaxed Consolability: no need to console PAINAD Score: 0    Home Living Family/patient expects to be discharged to:: Assisted living                   Additional Comments: Per notes, pt from ALF-memory care unit    Prior Function Prior Level of Function : Needs assist             Mobility Comments: Pt unable to provide PLOF.  Pt with dementia and non-verbal.  Ambulatory per chart       Hand Dominance        Extremity/Trunk Assessment   Upper Extremity Assessment Upper Extremity Assessment: Difficult to assess due to impaired cognition    Lower Extremity Assessment Lower Extremity Assessment: Difficult to assess due to impaired cognition    Cervical / Trunk Assessment Cervical / Trunk Assessment: Kyphotic  Communication   Communication: Expressive difficulties (pt did not respond to questions nor to commands. He did verbalize but his words were out of context and nonsensical.)  Cognition Arousal/Alertness: Awake/alert Behavior During Therapy: Restless Overall Cognitive Status: No family/caregiver present to determine baseline cognitive functioning                                 General Comments: pt very fidgety, reaching for items to handle (blankets, primo fit tubing, therapists's hands)        General Comments      Exercises     Assessment/Plan    PT Assessment Patient needs continued PT services  PT Problem List Decreased balance;Decreased  cognition;Decreased activity tolerance;Decreased safety awareness;Decreased mobility       PT Treatment Interventions Gait training;Functional mobility training;Balance training    PT Goals (Current goals can be found in the Care Plan section)  Acute Rehab PT Goals PT Goal Formulation: Patient unable to participate in goal setting Time For Goal Achievement: 03/26/22 Potential to Achieve Goals: Fair    Frequency Min 2X/week     Co-evaluation               AM-PAC PT "6 Clicks" Mobility  Outcome Measure Help needed turning from your back to your side while in a flat bed without using bedrails?: A Lot Help needed moving from lying on your back to sitting on the side of a flat bed without using bedrails?: A Lot Help needed moving to and from a bed to a chair (including a wheelchair)?: A Lot Help needed standing up from a chair using your arms (e.g., wheelchair or bedside chair)?: A Lot Help needed to walk in hospital room?: Total Help needed climbing 3-5 steps with a railing? : Total 6 Click Score: 10    End  of Session Equipment Utilized During Treatment: Gait belt Activity Tolerance: Patient tolerated treatment well Patient left: in bed;with bed alarm set;with call bell/phone within reach Nurse Communication: Mobility status PT Visit Diagnosis: Unsteadiness on feet (R26.81);Difficulty in walking, not elsewhere classified (R26.2)    Time: [id number] PT Time Calculation (min) (ACUTE ONLY): 15 min   Charges:   PT Evaluation $PT Eval Moderate Complexity: 1 Mod         Tamala SerUhlenberg, Zorawar Strollo Kistler PT 03/12/2022  Acute Rehabilitation Services Pager [phone] Office [phone]

## 2022-03-13 NOTE — Progress Notes (Signed)
Patient was picked up by PTAR and given the paperwork in a packet. Patient was stable for discharge.

## 2022-03-13 NOTE — Progress Notes (Addendum)
Patient was discharged on 6/6, discharge orders and discharge summary done by Dr Hanley Ben. D/c summary date updated per memory care requests . No acute interval changes, he is to be transported to memory care today, he is a DNR, DNR form placed on chart. Recommend palliative care continue to follow patient at memory care.

## 2022-03-13 NOTE — TOC Transition Note (Signed)
Transition of Care [facility]) - CM/SW Discharge Note   Patient Details  Name: Micheal [name] MRN: [id number] Date of Birth: 13-Mar-1956  [facility] of [facility]) CM/SW Contact:  Lennart Pall, LCSW Phone Number: 03/13/2022, 1:54 PM   Clinical Narrative:    Pt medically cleared for dc today to Memory Care at [facility].  Facility confirms guardian has completed paperwork.  PTAR called at 1:35 pm.  No further TOC needs.   Final next level of care: Memory Care Barriers to Discharge: Barriers Resolved   Patient Goals and CMS Choice Patient states their goals for this hospitalization and ongoing recovery are:: [facility]memory care CMS Medicare.gov Compare Post Acute Care list provided to:: Patient Represenative (must comment) Choice offered to / list presented to : Sibling, [facility] POA / [place]  Discharge Placement              Patient chooses bed at: Other - please specify in the comment section below: [facility]) Patient to be transferred to facility by: [place] Name of family member notified: guardian Patient and family notified of of transfer: 03/13/22  Discharge Plan and Services     Post Acute Care Choice: Home Health          DME Arranged: N/A DME Agency: NA                  Social Determinants of Health ([place]) Interventions     Readmission Risk Interventions    03/13/2022    1:53 PM  Readmission Risk Prevention Plan  Transportation Screening Complete  PCP or Specialist Appt within 3-5 Days Complete  HRI or [place] Complete  Social Work Consult for [place] Planning/Counseling Complete  Palliative Care Screening Complete  Medication Review Press photographer) Complete

## 2022-05-12 ENCOUNTER — Other Ambulatory Visit: Payer: Self-pay

## 2022-05-12 ENCOUNTER — Encounter (HOSPITAL_COMMUNITY): Payer: Self-pay

## 2022-05-12 ENCOUNTER — Emergency Department (HOSPITAL_COMMUNITY)
Admission: EM | Admit: 2022-05-12 | Discharge: 2022-05-12 | Disposition: A | Discharge status: Home / Self Care | Payer: Medicare Other | Attending: Emergency Medicine | Admitting: Emergency Medicine

## 2022-05-12 ENCOUNTER — Emergency Department (HOSPITAL_COMMUNITY): Payer: Medicare Other

## 2022-05-12 DIAGNOSIS — I10 Essential (primary) hypertension: Secondary | ICD-10-CM | POA: Insufficient documentation

## 2022-05-12 DIAGNOSIS — S199XXA Unspecified injury of neck, initial encounter: Secondary | ICD-10-CM | POA: Diagnosis present

## 2022-05-12 DIAGNOSIS — S01111A Laceration without foreign body of right eyelid and periocular area, initial encounter: Secondary | ICD-10-CM | POA: Insufficient documentation

## 2022-05-12 DIAGNOSIS — W19XXXA Unspecified fall, initial encounter: Secondary | ICD-10-CM | POA: Diagnosis not present

## 2022-05-12 DIAGNOSIS — S0990XA Unspecified injury of head, initial encounter: Secondary | ICD-10-CM | POA: Insufficient documentation

## 2022-05-12 DIAGNOSIS — S12691A Other nondisplaced fracture of seventh cervical vertebra, initial encounter for closed fracture: Secondary | ICD-10-CM | POA: Diagnosis not present

## 2022-05-12 MED ORDER — LIDOCAINE-EPINEPHRINE-TETRACAINE (LET) TOPICAL GEL
3.0000 mL | Freq: Once | TOPICAL | Status: AC
  Administered 2022-05-12: 3 mL via TOPICAL
  Filled 2022-05-12: qty 3

## 2022-05-12 MED ORDER — LIDOCAINE-EPINEPHRINE (PF) 2 %-1:200000 IJ SOLN
20.0000 mL | Freq: Once | INTRAMUSCULAR | Status: AC
  Administered 2022-05-12: 20 mL via INTRADERMAL
  Filled 2022-05-12: qty 20

## 2022-05-12 NOTE — ED Provider Notes (Signed)
[facility] EMERGENCY DEPARTMENT Provider Note  CSN: [id number] Arrival date & time: 05/12/22 0051  Chief Complaint(s) Fall and Head Laceration  HPI Micheal [name] is a 66 y.o. male with a past medical history listed below who presents from a skilled nursing facility after an unwitnessed fall resulting in right eyebrow laceration.  Patient is not anticoagulated.  Confused at baseline.  Unable to cooperate with history obtaining.  The history is provided by the EMS personnel.    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    Other frontotemporal neurocognitive disorder [facility])    Patient Active Problem List   Diagnosis Date Noted   B12 deficiency 03/11/2022   Chronic subdural hematoma (HCC) 03/10/2022   Dehydration 03/10/2022   Anemia of chronic disease 03/10/2022   Acute encephalopathy 03/09/2022   MRSA UTI 01/25/2022   Hypoglycemia 01/25/2022   Frontotemporal dementia (HCC) 01/23/2022   Acute on chronic intracranial subdural hematoma (HCC) 01/23/2022   History of pulmonary embolus (PE) 01/23/2022   MDD (major depressive disorder) 01/23/2022   Goals of care, counseling/discussion 01/23/2022   Protein-calorie malnutrition, severe 01/21/2022   Sepsis due to pneumonia (HCC) 01/18/2022   Unstable gait 08/06/2021   Weakness    Frontotemporal dementia with behavioral disturbance (HCC) 03/01/2020   Dehydration with hypernatremia 03/01/2020   Essential hypertension 03/01/2020   Home Medication(s) Prior to Admission medications   Medication Sig Start Date End Date Taking? Authorizing Provider  acetaminophen (TYLENOL) 325 MG tablet Take 2 tablets (650 mg total) by mouth every 4 (four) hours as needed for  mild pain (or temp > 37.5 C (99.5 F)). 08/14/21   Lonia Blood, MD  benztropine (COGENTIN) 1 MG tablet Take 1 tablet (1 mg total) by mouth 2 (two) times daily. 08/14/21   Lonia Blood, MD  folic acid (FOLVITE) 1 MG tablet Take 1 tablet (1 mg total) by mouth daily. 03/13/22   Glade Lloyd, MD  guaifenesin (ROBITUSSIN) 100 MG/5ML syrup Take 200 mg by mouth every 6 (six) hours as needed for cough.    [provider]  loperamide (IMODIUM) 2 MG capsule Take 2 mg by mouth as needed for diarrhea or loose stools (max 8 doses/ 24 hours).    [provider]  Neomycin-Bacitracin-Polymyxin (TRIPLE ANTIBIOTIC) [phone] OINT Apply 1 application topically daily as needed (minor skin tears/abrasians).    [provider]  NON FORMULARY Take 1 Can by mouth 3 (three) times daily. Mighty Shakes    [provider]  polyethylene glycol (MIRALAX / GLYCOLAX) 17 g packet Take 17 g by mouth daily as needed for mild constipation. Patient taking differently: Take 17 g by mouth daily as needed (constipation). 03/07/20   Zannie Cove, MD  vitamin B-12 (CYANOCOBALAMIN) 1000 MCG tablet Take 1 tablet (1,000 mcg total) by mouth daily. 03/12/22   Glade Lloyd, MD  Allergies Patient has no known allergies.  Review of Systems Review of Systems As noted in HPI  Physical Exam Vital Signs  I have reviewed the triage vital signs BP 132/77   Pulse 60   Temp (!) 97.4 F (36.3 C)   Resp 17   SpO2 100%   Physical Exam Constitutional:      General: He is not in acute distress.    Appearance: He is well-developed. He is not diaphoretic.  HENT:     Head: Normocephalic. Laceration present.      Right Ear: External ear normal.     Left Ear: External ear normal.  Eyes:     General: No scleral icterus.       Right eye: No discharge.        Left eye:  No discharge.     Conjunctiva/sclera: Conjunctivae normal.     Pupils: Pupils are equal, round, and reactive to light.  Cardiovascular:     Rate and Rhythm: Regular rhythm.     Pulses:          Radial pulses are 2+ on the right side and 2+ on the left side.       Dorsalis pedis pulses are 2+ on the right side and 2+ on the left side.     Heart sounds: Normal heart sounds. No murmur heard.    No friction rub. No gallop.  Pulmonary:     Effort: Pulmonary effort is normal. No respiratory distress.     Breath sounds: Normal breath sounds. No stridor.  Abdominal:     General: There is no distension.     Palpations: Abdomen is soft.     Tenderness: There is no abdominal tenderness.  Musculoskeletal:     Cervical back: Normal range of motion and neck supple. No bony tenderness.     Thoracic back: No bony tenderness.     Lumbar back: No bony tenderness.     Comments: Clavicle stable. Chest stable to AP/Lat compression. Pelvis stable to Lat compression. No obvious extremity deformity. No chest or abdominal wall contusion.  Skin:    General: Skin is warm.  Neurological:     Mental Status: He is alert.     Comments: Moving all extremities       ED Results and Treatments Labs (all labs ordered are listed, but only abnormal results are displayed) Labs Reviewed - No data to display                                                                                                                       EKG  EKG Interpretation  Date/Time:  Sunday May 12 2022 00:43:44 EDT Ventricular Rate:  69 PR Interval:  176 QRS Duration: 103 QT Interval:  411 QTC Calculation: 441 R Axis:   81 Text Interpretation: Sinus rhythm Borderline right axis deviation No significant change was found Confirmed by Drema Pry [phone]) on 05/12/2022 3:30:54 AM       Radiology CT Head  Wo Contrast  Result Date: 05/12/2022 CLINICAL DATA:  Fall EXAM: CT HEAD WITHOUT CONTRAST CT CERVICAL SPINE WITHOUT  CONTRAST TECHNIQUE: Multidetector CT imaging of the head and cervical spine was performed following the standard protocol without intravenous contrast. Multiplanar CT image reconstructions of the cervical spine were also generated. RADIATION DOSE REDUCTION: This exam was performed according to the departmental dose-optimization program which includes automated exposure control, adjustment of the mA and/or kV according to patient size and/or use of iterative reconstruction technique. COMPARISON:  None Available. FINDINGS: CT HEAD FINDINGS Brain: There is no mass, hemorrhage or extra-axial collection. The size and configuration of the ventricles and extra-axial CSF spaces are normal. There is hypoattenuation of the periventricular white matter, most commonly indicating chronic ischemic microangiopathy. Vascular: No abnormal hyperdensity of the major intracranial arteries or dural venous sinuses. No intracranial atherosclerosis. Skull: Small frontal scalp hematoma.  No skull fracture. Sinuses/Orbits: No fluid levels or advanced mucosal thickening of the visualized paranasal sinuses. No mastoid or middle ear effusion. The orbits are normal. CT CERVICAL SPINE FINDINGS Alignment: There is grade 1 anterolisthesis at C6-7 which is new since 01/07/2022. Skull base and vertebrae: There is an acute fracture of the left articular pillar of C7 with minimal displacement (series 10, image 41). The facet joint is not dislocated. The superior fracture fragment is anteriorly displaced by approximately 3 mm. No other fracture. Soft tissues and spinal canal: No prevertebral fluid or swelling. No visible canal hematoma. Disc levels: No advanced spinal canal or neural foraminal stenosis. Upper chest: No pneumothorax, pulmonary nodule or pleural effusion. Other: Normal visualized paraspinal cervical soft tissues. IMPRESSION: 1. No acute intracranial abnormality. 2. New grade 1 anterolisthesis at C6-7 (AO Spine classification C) with acute  fracture of the left articular pillar of C7 (AO Spine classification F2). MRI is recommended to assess for ligamentous injury. Critical Value/emergent results were called by telephone at the time of interpretation on 05/12/2022 at 1:52 am to provider [facility] Patrick Salemi , who verbally acknowledged these results. Electronically Signed   By: Deatra RobinsonKevin  Herman M.D.   On: 05/12/2022 01:53   CT Cervical Spine Wo Contrast  Result Date: 05/12/2022 CLINICAL DATA:  Fall EXAM: CT HEAD WITHOUT CONTRAST CT CERVICAL SPINE WITHOUT CONTRAST TECHNIQUE: Multidetector CT imaging of the head and cervical spine was performed following the standard protocol without intravenous contrast. Multiplanar CT image reconstructions of the cervical spine were also generated. RADIATION DOSE REDUCTION: This exam was performed according to the departmental dose-optimization program which includes automated exposure control, adjustment of the mA and/or kV according to patient size and/or use of iterative reconstruction technique. COMPARISON:  None Available. FINDINGS: CT HEAD FINDINGS Brain: There is no mass, hemorrhage or extra-axial collection. The size and configuration of the ventricles and extra-axial CSF spaces are normal. There is hypoattenuation of the periventricular white matter, most commonly indicating chronic ischemic microangiopathy. Vascular: No abnormal hyperdensity of the major intracranial arteries or dural venous sinuses. No intracranial atherosclerosis. Skull: Small frontal scalp hematoma.  No skull fracture. Sinuses/Orbits: No fluid levels or advanced mucosal thickening of the visualized paranasal sinuses. No mastoid or middle ear effusion. The orbits are normal. CT CERVICAL SPINE FINDINGS Alignment: There is grade 1 anterolisthesis at C6-7 which is new since 01/07/2022. Skull base and vertebrae: There is an acute fracture of the left articular pillar of C7 with minimal displacement (series 10, image 41). The facet joint is not  dislocated. The superior fracture fragment is anteriorly displaced by approximately 3 mm. No other fracture.  Soft tissues and spinal canal: No prevertebral fluid or swelling. No visible canal hematoma. Disc levels: No advanced spinal canal or neural foraminal stenosis. Upper chest: No pneumothorax, pulmonary nodule or pleural effusion. Other: Normal visualized paraspinal cervical soft tissues. IMPRESSION: 1. No acute intracranial abnormality. 2. New grade 1 anterolisthesis at C6-7 (AO Spine classification C) with acute fracture of the left articular pillar of C7 (AO Spine classification F2). MRI is recommended to assess for ligamentous injury. Critical Value/emergent results were called by telephone at the time of interpretation on 05/12/2022 at 1:52 am to provider [facility] , who verbally acknowledged these results. Electronically Signed   By: Deatra Robinson M.D.   On: 05/12/2022 01:53    Pertinent labs & imaging results that were available during my care of the patient were reviewed by me and considered in my medical decision making (see MDM for details).  Medications Ordered in ED Medications  lidocaine-EPINEPHrine (XYLOCAINE W/EPI) 2 %-1:200000 (PF) injection 20 mL (20 mLs Intradermal Given 05/12/22 0306)  lidocaine-EPINEPHrine-tetracaine (LET) topical gel (3 mLs Topical Given 05/12/22 0306)                                                                                                                                     Procedures .Marland KitchenLaceration Repair  Date/Time: 05/12/2022 6:36 AM  Performed by: Nira Conn, MD Authorized by: Nira Conn, MD   Consent:    Consent obtained:  Emergent situation Universal protocol:    Imaging studies available: yes     Patient identity confirmed:  Arm band Anesthesia:    Anesthesia method:  Topical application and local infiltration   Topical anesthetic:  LET   Local anesthetic:  Lidocaine 2% WITH epi Laceration details:    Location:   Face   Face location:  R eyebrow   Length (cm):  3.5   Depth (mm):  15 Pre-procedure details:    Preparation:  Patient was prepped and draped in usual sterile fashion and imaging obtained to evaluate for foreign bodies Exploration:    Hemostasis achieved with:  Direct pressure   Imaging obtained comment:  CT   Imaging outcome: foreign body not noted     Wound exploration: wound explored through full range of motion and entire depth of wound visualized     Wound extent: no fascia violation noted, no foreign bodies/material noted, no muscle damage noted, no underlying fracture noted and no vascular damage noted     Contaminated: no   Treatment:    Area cleansed with:  Povidone-iodine   Amount of cleaning:  Extensive   Irrigation solution:  Sterile saline   Irrigation volume:  1000cc   Irrigation method:  Pressure wash   Debridement:  Moderate   Layers/structures repaired:  Deep subcutaneous Deep subcutaneous:    Suture size:  4-0   Suture material:  Vicryl   Suture technique:  Buried horizontal mattress   Number  of sutures:  4 Skin repair:    Repair method:  Sutures   Suture size:  5-0   Suture material:  Fast-absorbing gut   Suture technique:  Running locked   Number of sutures:  9 Approximation:    Approximation:  Close Repair type:    Repair type:  Complex Post-procedure details:    Dressing:  Antibiotic ointment   Procedure completion:  Tolerated   (including critical care time)  Medical Decision Making / ED Course    Complexity of Problem:  Co-morbidities/SDOH that complicate the patient evaluation/care: Noted above in HPI  Additional history obtained: EMS  Patient's presenting problem/concern, DDX, and MDM listed below: Unwitnessed fall resulting in right eyebrow laceration We will need to obtain imaging to rule out any serious head or neck injuries. No other injuries noted on exam requiring work-up or imaging  Hospitalization Considered:  Yes     Complexity of Data:     Imaging Studies ordered listed below with my independent interpretation: CT head negative for ICH. CT of the cervical spine questionable for C6-C7 fracture Radiology noted grade 1 anterolisthesis C6-C7 with fracture of the left articular pillar of C7.  We will need to order MRI for better assessment     ED Course:    Assessment, Add'l Intervention, and Reassessment: Fall resulting in head trauma Laceration thoroughly irrigated and closed as above  Imaging notable for possible unstable fracture Pending MRI.  Patient care turned over to oncoming provider. Patient case and results discussed in detail; please see their note for further ED managment.     Final Clinical Impression(s) / ED Diagnoses Final diagnoses:  Injury of head, initial encounter  Laceration of right eyebrow, initial encounter  Other closed nondisplaced fracture of seventh cervical vertebra, initial encounter [facility])           This chart was dictated using voice recognition software.  Despite best efforts to proofread,  errors can occur which can change the documentation meaning.    Nira Conn, MD 05/12/22 [phone]

## 2022-05-12 NOTE — ED Provider Notes (Signed)
I received the patient in signout from Dr. Eudelia Bunch, briefly the patient is a 66 year old male with a chief complaints of a fall.  During the work-up the patient was found to have a C-spine fracture that was concerning for an unstable fracture/ligamentous injury.  Plan for MRI.  MRI with concern for subluxation at the C6-7 facet.  I discussed this case with Dr. Lovell Sheehan, neurosurgery based on the patient's very poor cognition and mobility at baseline and felt not a good candidate for surgical intervention.  We will have the patient follow-up as an outpatient.  Hard collar at all times.   Melene Plan, DO 05/12/22 1156

## 2022-05-12 NOTE — ED Notes (Signed)
Pt returns from MRI, placed back on tele.

## 2022-05-12 NOTE — ED Triage Notes (Signed)
Pt BIB EMS from [facility] after an unwitnessed fall. Pt did hit head during fall and now has a head laceration. Pt is nonambulatory and confused at baseline.   VSS w/ EMS.

## 2022-05-12 NOTE — ED Notes (Signed)
[facility]- [phone]

## 2022-05-12 NOTE — Discharge Instructions (Signed)
You have broken a bone in your neck.  I spoke with the neurosurgeon about this and they would like to see you in the office in about 8 weeks.  They want you in the collar at all times.

## 2022-05-12 NOTE — ED Notes (Signed)
Patient is resting comfortably. 

## 2022-05-12 NOTE — ED Notes (Signed)
Patient transported to MRI 

## 2022-05-12 NOTE — ED Notes (Signed)
RN updated legal guardian, [name]

## 2022-05-12 NOTE — ED Notes (Signed)
RN called [facility], unable to give a time for pickup

## 2022-06-22 ENCOUNTER — Encounter (HOSPITAL_COMMUNITY): Payer: Self-pay

## 2022-06-22 ENCOUNTER — Emergency Department (HOSPITAL_COMMUNITY)
Admission: EM | Admit: 2022-06-22 | Discharge: 2022-06-22 | Disposition: A | Discharge status: Skilled Nursing Facility | Payer: Medicare Other | Attending: Emergency Medicine | Admitting: Emergency Medicine

## 2022-06-22 ENCOUNTER — Emergency Department (HOSPITAL_COMMUNITY): Payer: Medicare Other

## 2022-06-22 ENCOUNTER — Other Ambulatory Visit: Payer: Self-pay

## 2022-06-22 DIAGNOSIS — I10 Essential (primary) hypertension: Secondary | ICD-10-CM | POA: Insufficient documentation

## 2022-06-22 DIAGNOSIS — T189XXA Foreign body of alimentary tract, part unspecified, initial encounter: Secondary | ICD-10-CM | POA: Diagnosis not present

## 2022-06-22 DIAGNOSIS — X58XXXA Exposure to other specified factors, initial encounter: Secondary | ICD-10-CM | POA: Insufficient documentation

## 2022-06-22 DIAGNOSIS — F0390 Unspecified dementia without behavioral disturbance: Secondary | ICD-10-CM | POA: Diagnosis not present

## 2022-06-22 DIAGNOSIS — Z711 Person with feared health complaint in whom no diagnosis is made: Secondary | ICD-10-CM

## 2022-06-22 MED ORDER — ALUM & MAG HYDROXIDE-SIMETH 200-200-20 MG/5ML PO SUSP
30.0000 mL | Freq: Once | ORAL | Status: AC
  Administered 2022-06-22: 30 mL via ORAL
  Filled 2022-06-22: qty 30

## 2022-06-22 NOTE — ED Provider Notes (Signed)
[facility] DEPT Provider Note   CSN: [id number] Arrival date & time: 06/22/22  2033     History  Chief Complaint  Patient presents with   Swallowed Foreign Body    Micheal [name] is a 66 y.o. male history of chronic subdural hematoma, hypertension, recent C-spine subluxation here presenting with swallowed foreign body.  Patient was seen here about a month ago and had C-spine subluxation after a fall.  Patient was put on a hard collar.  Patient was found chewing on thumb tact today.  Apparently he swallowed some prior to arrival.  Patient was noted to have some dried blood in his mouth.  Denies any pain with swallowing or chest pain or abdominal pain  The history is provided by the patient and the EMS personnel.   Level V caveat- dementia     Home Medications Prior to Admission medications   Medication Sig Start Date End Date Taking? Authorizing Provider  acetaminophen (TYLENOL) 325 MG tablet Take 2 tablets (650 mg total) by mouth every 4 (four) hours as needed for mild pain (or temp > 37.5 C (99.5 F)). 08/14/21   Cherene Altes, MD  benztropine (COGENTIN) 1 MG tablet Take 1 tablet (1 mg total) by mouth 2 (two) times daily. 08/14/21   Cherene Altes, MD  folic acid (FOLVITE) 1 MG tablet Take 1 tablet (1 mg total) by mouth daily. 03/13/22   Aline August, MD  guaifenesin (ROBITUSSIN) 100 MG/5ML syrup Take 200 mg by mouth every 6 (six) hours as needed for cough.    [provider]  loperamide (IMODIUM) 2 MG capsule Take 2 mg by mouth as needed for diarrhea or loose stools (max 8 doses/ 24 hours).    [provider]  Neomycin-Bacitracin-Polymyxin (TRIPLE ANTIBIOTIC) [phone] OINT Apply 1 application topically daily as needed (minor skin tears/abrasians).    [provider]  NON FORMULARY Take 1 Can by mouth 3 (three) times daily. Mighty Shakes    [provider]  polyethylene glycol (MIRALAX / GLYCOLAX) 17 g  packet Take 17 g by mouth daily as needed for mild constipation. Patient taking differently: Take 17 g by mouth daily as needed (constipation). 03/07/20   Domenic Polite, MD  vitamin B-12 (CYANOCOBALAMIN) 1000 MCG tablet Take 1 tablet (1,000 mcg total) by mouth daily. 03/12/22   Aline August, MD      Allergies    Patient has no known allergies.    Review of Systems   Review of Systems  Psychiatric/Behavioral:  Positive for confusion.   All other systems reviewed and are negative.   Physical Exam Updated Vital Signs BP 129/85 (BP Location: Right Arm)   Pulse (!) 58   Temp 98.1 F (36.7 C) (Oral)   Resp 16   Ht 6' (1.829 m)   Wt 68 kg   SpO2 100%   BMI 20.34 kg/m  Physical Exam Vitals and nursing note reviewed.  Constitutional:      Comments: Demented and confused  HENT:     Head: Normocephalic.     Mouth/Throat:     Comments: Dried blood in the mouth but no obvious foreign body visualized Eyes:     Extraocular Movements: Extraocular movements intact.     Pupils: Pupils are equal, round, and reactive to light.  Neck:     Comments: Hard collar in place Cardiovascular:     Rate and Rhythm: Normal rate and regular rhythm.     Pulses: Normal pulses.  Heart sounds: Normal heart sounds.  Pulmonary:     Effort: Pulmonary effort is normal.     Breath sounds: Normal breath sounds.  Abdominal:     General: Abdomen is flat.     Palpations: Abdomen is soft.  Musculoskeletal:        General: Normal range of motion.  Skin:    General: Skin is warm.     Capillary Refill: Capillary refill takes less than 2 seconds.  Neurological:     Comments: Demented and moving all extremities  Psychiatric:     Comments: Unable     ED Results / Procedures / Treatments   Labs (all labs ordered are listed, but only abnormal results are displayed) Labs Reviewed - No data to display  EKG None  Radiology No results found.  Procedures Procedures    Medications Ordered in  ED Medications - No data to display  ED Course/ Medical Decision Making/ A&P                           Medical Decision Making Micheal [name] is a 66 y.o. male here presenting with swallowing thumbtacks. Patient is demented he was chewing on the thumbtacks and may have swallowed them.  We will get x-rays to further assess.  9:48 PM X-ray did not show any radio opaque foreign body.  I think he may have not swallowed the thumbtacks and may just have chewed on it and spit it out.  Patient is able to swallow his secretions and tolerate GI cocktail.  Stable for discharge.  Problems Addressed: Worried well: acute illness or injury  Amount and/or Complexity of Data Reviewed Radiology: ordered and independent interpretation performed. Decision-making details documented in ED Course.  Risk OTC drugs.   Final Clinical Impression(s) / ED Diagnoses Final diagnoses:  None    Rx / DC Orders ED Discharge Orders     None         Charlynne Pander, MD 06/22/22 2149

## 2022-06-22 NOTE — ED Notes (Signed)
[facility] called  

## 2022-06-22 NOTE — Discharge Instructions (Signed)
There are no obvious thumbtacks visible on the x-rays today.  Please make sure he does not have any access to sharps objects that he can swallow  Follow-up with your doctor  Return to ER if he has trouble swallowing, chest pain, trouble breathing

## 2022-06-22 NOTE — ED Notes (Signed)
X-ray at bedside

## 2022-06-22 NOTE — ED Triage Notes (Signed)
Pt to ED via [facility] from [facility].  Per EMS staff informed them that pt swallowed 2-3 thumbtacks prior to arrival.  Pt arrives in c-collar from previous injury. Per EMS staff states pt is at baseline mentation, has not had any GI issues, small amount of blood noted in mouth. Per staff pt had thumbtacks in mouth prior to swallowing.

## 2022-10-10 ENCOUNTER — Emergency Department (HOSPITAL_COMMUNITY): Payer: Medicare Other

## 2022-10-10 ENCOUNTER — Emergency Department (HOSPITAL_COMMUNITY)
Admission: EM | Admit: 2022-10-10 | Discharge: 2022-10-11 | Disposition: A | Discharge status: Skilled Nursing Facility | Payer: Medicare Other | Attending: Emergency Medicine | Admitting: Emergency Medicine

## 2022-10-10 ENCOUNTER — Encounter (HOSPITAL_COMMUNITY): Payer: Self-pay

## 2022-10-10 ENCOUNTER — Other Ambulatory Visit: Payer: Self-pay

## 2022-10-10 DIAGNOSIS — S0083XA Contusion of other part of head, initial encounter: Secondary | ICD-10-CM

## 2022-10-10 DIAGNOSIS — S0181XA Laceration without foreign body of other part of head, initial encounter: Secondary | ICD-10-CM | POA: Insufficient documentation

## 2022-10-10 DIAGNOSIS — S51011A Laceration without foreign body of right elbow, initial encounter: Secondary | ICD-10-CM | POA: Diagnosis not present

## 2022-10-10 DIAGNOSIS — S60512A Abrasion of left hand, initial encounter: Secondary | ICD-10-CM | POA: Diagnosis not present

## 2022-10-10 DIAGNOSIS — S60511A Abrasion of right hand, initial encounter: Secondary | ICD-10-CM | POA: Insufficient documentation

## 2022-10-10 DIAGNOSIS — S41112A Laceration without foreign body of left upper arm, initial encounter: Secondary | ICD-10-CM | POA: Insufficient documentation

## 2022-10-10 DIAGNOSIS — S59901A Unspecified injury of right elbow, initial encounter: Secondary | ICD-10-CM | POA: Diagnosis present

## 2022-10-10 DIAGNOSIS — Y92129 Unspecified place in nursing home as the place of occurrence of the external cause: Secondary | ICD-10-CM | POA: Insufficient documentation

## 2022-10-10 DIAGNOSIS — T148XXA Other injury of unspecified body region, initial encounter: Secondary | ICD-10-CM

## 2022-10-10 DIAGNOSIS — F0390 Unspecified dementia without behavioral disturbance: Secondary | ICD-10-CM | POA: Insufficient documentation

## 2022-10-10 HISTORY — DX: Unspecified dementia, unspecified severity, without behavioral disturbance, psychotic disturbance, mood disturbance, and anxiety: F03.90

## 2022-10-10 MED ORDER — LIDOCAINE HCL (PF) 1 % IJ SOLN
5.0000 mL | Freq: Once | INTRAMUSCULAR | Status: DC
  Filled 2022-10-10: qty 30

## 2022-10-10 MED ORDER — LORAZEPAM 1 MG PO TABS
1.0000 mg | ORAL_TABLET | Freq: Once | ORAL | Status: AC
  Administered 2022-10-10: 1 mg via ORAL
  Filled 2022-10-10: qty 1

## 2022-10-10 MED ORDER — QUETIAPINE FUMARATE 50 MG PO TABS
25.0000 mg | ORAL_TABLET | Freq: Once | ORAL | Status: AC
  Administered 2022-10-10: 25 mg via ORAL
  Filled 2022-10-10: qty 1
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
## 2022-10-14 ENCOUNTER — Other Ambulatory Visit: Payer: Self-pay

## 2022-10-14 ENCOUNTER — Emergency Department (HOSPITAL_COMMUNITY): Payer: Medicare Other

## 2022-10-14 ENCOUNTER — Encounter (HOSPITAL_COMMUNITY): Payer: Self-pay

## 2022-10-14 ENCOUNTER — Emergency Department (HOSPITAL_COMMUNITY)
Admission: EM | Admit: 2022-10-14 | Discharge: 2022-10-15 | Disposition: A | Discharge status: Home / Self Care | Payer: Medicare Other | Attending: Emergency Medicine | Admitting: Emergency Medicine

## 2022-10-14 DIAGNOSIS — I700 Atherosclerosis of aorta: Secondary | ICD-10-CM | POA: Insufficient documentation

## 2022-10-14 DIAGNOSIS — Z1152 Encounter for screening for COVID-19: Secondary | ICD-10-CM | POA: Diagnosis not present

## 2022-10-14 DIAGNOSIS — K5730 Diverticulosis of large intestine without perforation or abscess without bleeding: Secondary | ICD-10-CM | POA: Diagnosis not present

## 2022-10-14 DIAGNOSIS — K5900 Constipation, unspecified: Secondary | ICD-10-CM

## 2022-10-14 DIAGNOSIS — I10 Essential (primary) hypertension: Secondary | ICD-10-CM | POA: Diagnosis not present

## 2022-10-14 DIAGNOSIS — R112 Nausea with vomiting, unspecified: Secondary | ICD-10-CM | POA: Diagnosis not present

## 2022-10-14 DIAGNOSIS — F0390 Unspecified dementia without behavioral disturbance: Secondary | ICD-10-CM | POA: Insufficient documentation

## 2022-10-14 DIAGNOSIS — E860 Dehydration: Secondary | ICD-10-CM

## 2022-10-14 DIAGNOSIS — N390 Urinary tract infection, site not specified: Secondary | ICD-10-CM

## 2022-10-14 LAB — CBC WITH DIFFERENTIAL/PLATELET
Abs Immature Granulocytes: 0.06 10*3/uL (ref 0.00–0.07)
Basophils Absolute: 0 10*3/uL (ref 0.0–0.1)
Basophils Relative: 0 %
Eosinophils Absolute: 0.1 10*3/uL (ref 0.0–0.5)
Eosinophils Relative: 0 %
HCT: 44.5 % (ref 39.0–52.0)
Hemoglobin: 13.8 g/dL (ref 13.0–17.0)
Immature Granulocytes: 1 %
Lymphocytes Relative: 10 %
Lymphs Abs: 1.3 10*3/uL (ref 0.7–4.0)
MCH: 33.2 pg (ref 26.0–34.0)
MCHC: 31 g/dL (ref 30.0–36.0)
MCV: 107 fL — ABNORMAL HIGH (ref 80.0–100.0)
Monocytes Absolute: 0.8 10*3/uL (ref 0.1–1.0)
Monocytes Relative: 6 %
Neutro Abs: 10.7 10*3/uL — ABNORMAL HIGH (ref 1.7–7.7)
Neutrophils Relative %: 83 %
Platelets: 148 10*3/uL — ABNORMAL LOW (ref 150–400)
RBC: 4.16 MIL/uL — ABNORMAL LOW (ref 4.22–5.81)
RDW: 13.3 % (ref 11.5–15.5)
WBC: 13 10*3/uL — ABNORMAL HIGH (ref 4.0–10.5)
nRBC: 0 % (ref 0.0–0.2)

## 2022-10-14 LAB — COMPREHENSIVE METABOLIC PANEL
ALT: 31 U/L (ref 0–44)
AST: 30 U/L (ref 15–41)
Albumin: 4 g/dL (ref 3.5–5.0)
Alkaline Phosphatase: 63 U/L (ref 38–126)
Anion gap: 7 (ref 5–15)
BUN: 30 mg/dL — ABNORMAL HIGH (ref 8–23)
CO2: 28 mmol/L (ref 22–32)
Calcium: 8.5 mg/dL — ABNORMAL LOW (ref 8.9–10.3)
Chloride: 113 mmol/L — ABNORMAL HIGH (ref 98–111)
Creatinine, Ser: 0.71 mg/dL (ref 0.61–1.24)
GFR, Estimated: >60 mL/min (ref >60)
Glucose, Bld: 113 mg/dL — ABNORMAL HIGH (ref 70–99)
Potassium: 4 mmol/L (ref 3.5–5.1)
Sodium: 148 mmol/L — ABNORMAL HIGH (ref 135–145)
Total Bilirubin: 0.9 mg/dL (ref 0.3–1.2)
Total Protein: 7 g/dL (ref 6.5–8.1)

## 2022-10-14 LAB — URINALYSIS, ROUTINE W REFLEX MICROSCOPIC
Bacteria, UA: NONE SEEN
Bilirubin Urine: NEGATIVE
Glucose, UA: NEGATIVE mg/dL
Hgb urine dipstick: NEGATIVE
Ketones, ur: NEGATIVE mg/dL
Leukocytes,Ua: NEGATIVE
Nitrite: NEGATIVE
Protein, ur: 30 mg/dL — AB
Specific Gravity, Urine: 1.032 — ABNORMAL HIGH (ref 1.005–1.030)
pH: 5 (ref 5.0–8.0)

## 2022-10-14 LAB — RESP PANEL BY RT-PCR (RSV, FLU A&B, COVID)  RVPGX2
Influenza A by PCR: NEGATIVE
Influenza B by PCR: NEGATIVE
Resp Syncytial Virus by PCR: NEGATIVE
SARS Coronavirus 2 by RT PCR: NEGATIVE

## 2022-10-14 LAB — LIPASE, BLOOD: Lipase: 85 U/L — ABNORMAL HIGH (ref 11–51)

## 2022-10-14 MED ORDER — ONDANSETRON HCL 4 MG/2ML IJ SOLN
4.0000 mg | Freq: Once | INTRAMUSCULAR | Status: AC
  Administered 2022-10-14: 4 mg via INTRAVENOUS
  Filled 2022-10-14: qty 2

## 2022-10-14 MED ORDER — SODIUM CHLORIDE 0.9 % IV BOLUS
500.0000 mL | Freq: Once | INTRAVENOUS | Status: AC
  Administered 2022-10-14: 500 mL via INTRAVENOUS

## 2022-10-14 MED ORDER — IOHEXOL 300 MG/ML  SOLN
100.0000 mL | Freq: Once | INTRAMUSCULAR | Status: AC | PRN
  Administered 2022-10-14: 100 mL via INTRAVENOUS

## 2022-10-14 MED ORDER — IOHEXOL 300 MG/ML  SOLN: 100.0000 mL | Freq: Once | INTRAMUSCULAR | Status: DC | PRN

## 2022-10-14 NOTE — ED Triage Notes (Signed)
Pt. BIB [facility] from [facility] for N&V x 4 hrs. Pt. Has hx of dementia. No vomiting with EMS no diarrhea per EMS. VSS with EMS
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
## 2022-10-15 DIAGNOSIS — R112 Nausea with vomiting, unspecified: Secondary | ICD-10-CM | POA: Diagnosis not present

## 2022-10-15 MED ORDER — ONDANSETRON 4 MG PO TBDP: 4.0000 mg | ORAL_TABLET | Freq: Three times a day (TID) | ORAL | 0 refills | Status: AC | PRN

## 2022-10-15 MED ORDER — CEPHALEXIN 500 MG PO CAPS: 500.0000 mg | ORAL_CAPSULE | Freq: Two times a day (BID) | ORAL | 0 refills | Status: AC

## 2022-10-15 MED ORDER — SODIUM CHLORIDE 0.9 % BOLUS PEDS
1000.0000 mL | Freq: Once | INTRAVENOUS | Status: AC
  Administered 2022-10-15: 1000 mL via INTRAVENOUS

## 2022-10-15 MED ORDER — FLEET ENEMA 7-19 GM/118ML RE ENEM
1.0000 | ENEMA | Freq: Once | RECTAL | Status: AC
  Administered 2022-10-15: 1 via RECTAL
  Filled 2022-10-15: qty 1

## 2022-10-15 MED ORDER — SODIUM CHLORIDE 0.9 % IV SOLN
1.0000 g | Freq: Once | INTRAVENOUS | Status: AC
  Administered 2022-10-15: 1 g via INTRAVENOUS
  Filled 2022-10-15: qty 10

## 2022-10-15 MED ORDER — SENNOSIDES-DOCUSATE SODIUM 8.6-50 MG PO TABS: 1.0000 | ORAL_TABLET | Freq: Every day | ORAL | 0 refills | Status: AC

## 2022-10-15 NOTE — ED Provider Notes (Signed)
  Physical Exam  BP 138/82   Pulse 73   Temp 97.6 F (36.4 C) (Oral)   Resp 16   SpO2 100%   Physical Exam Constitutional:      Comments: Chronically ill-appearing but no acute distress  Cardiovascular:     Rate and Rhythm: Normal rate.  Pulmonary:     Effort: Pulmonary effort is normal.  Abdominal:     General: Abdomen is flat.     Procedures  Procedures  ED Course / MDM   Clinical Course as of 10/15/22 0236  Mon Oct 14, 2022  2312 47 M with chronic SDH, HTN ,dementia who presents with emesis. Lipase 85, WBC 13, BUN 30. Follow up UA, respiratory panel, CTH and CTAP with contrast. [VB]  Tue Oct 15, 2022  0025 Viral panel negative.  CTAP without any acute intra-abdominal pathology however does show evidence of cystitis as well as constipation. CTH without acute intracranial pathology.  UA not significantly suggestive of UTI however WBC 6-10 with a leukocytosis 13 and vomiting will cover with Rocephin.  I have ordered another liter bolus for mild dehydration with elevated BUN 30 and hypernatremia 148 and specific gravity elevated 1.03 with proteinuria.  Enema ordered for constipation. [VB]  [id number] Patient remains hemodynamically stable and is resting comfortably.  He was given 1.5 L of fluid for suspected dehydration consistent with labs today.  He was also given a dose of IV Rocephin for possible associated cystitis based off his CTAP and enema for constipation.  Nursing staff did not encounter any type of blockage or impaction requiring disimpaction.  CT head without any new acute changes.  Do not think patient is septic.  Will continue patient on bowel regimen, sent home with as needed Zofran and 7 days of Keflex for possible cystitis.  Believe he is safe to go back to facility at this time as he has had no further vomiting after being here for 6 hours of observation. [VB]    Clinical Course User Index [VB] Elgie Congo, MD   Medical Decision Making Amount and/or Complexity  of Data Reviewed Labs: ordered. Radiology: ordered.  Risk OTC drugs. Prescription drug management.          Elgie Congo, MD 10/15/22 [phone]

## 2022-10-15 NOTE — Discharge Instructions (Addendum)
You have been seen in the Emergency Department (ED)  today for nausea and vomiting.  Your work up today has not shown a clear cause for your symptoms, but they may be due to a viral infection or food poisoning. You have been prescribed Zofran; please use as prescribed as needed for your nausea.  Your CT scan did show evidence of constipation.  Take the Senokot daily and over-the-counter MiraLAX daily.  You were given an enema here.  You are given IV fluids for mild dehydration today.  You have also been prescribed 7 days of Keflex, an antibiotic, to treat urinary tract infection.  Follow up with your doctor as soon as possible, ideally within one week, regarding today's emergent visit and your symptoms of nausea/vomiting.   Return to the Emergency Department (ED)  if you develop severe abdominal pain, bloody vomiting, bloody diarrhea, if you are unable to tolerate fluids due to vomiting, or if you develop other symptoms that concern you.

## 2023-04-19 IMAGING — CT CT HEAD W/O CM
2 of 5 series · 12 of 47 positions shown, 15 images · non-contrast
Comparison: 07/03/2021

CLINICAL DATA: Neuro deficit, acute, stroke suspected. Gait
abnormality, lean to the left. Dementia.

EXAM:
CT HEAD WITHOUT CONTRAST
TECHNIQUE: Contiguous axial images were obtained from the base of the skull
through the vertex without intravenous contrast.

[Series 5: coronal soft tissue · coronal · 0.33mm/px · 3 of 77 slices shown]
[im 26/77  brain]
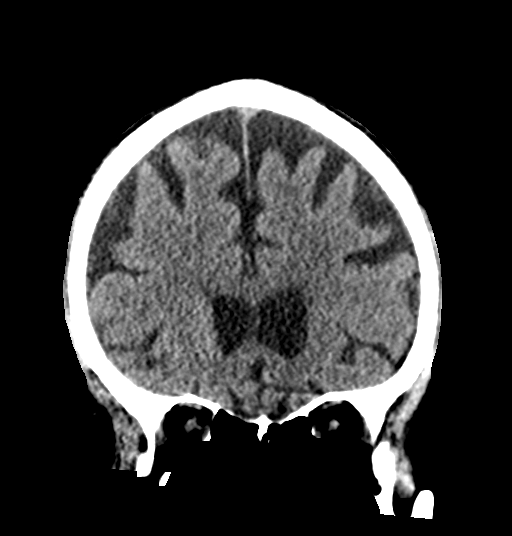
[im 34/77  brain]
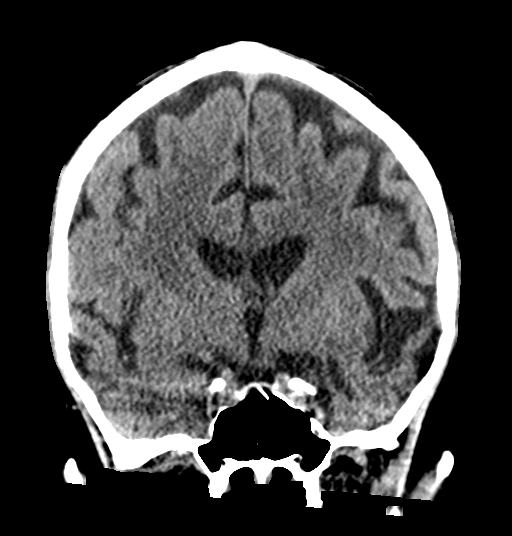
[im 43/77  brain]
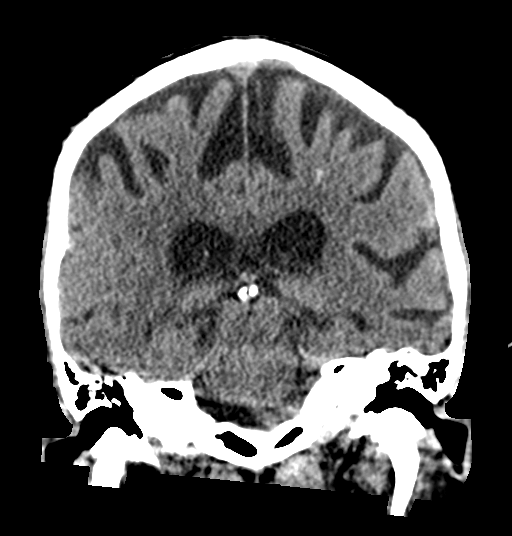

[Series 7: true axial · axial · 0.33mm/px · z∈[-188,-51]mm · 9 of 58 slices shown, 12 images]
[im 6/58  brain]
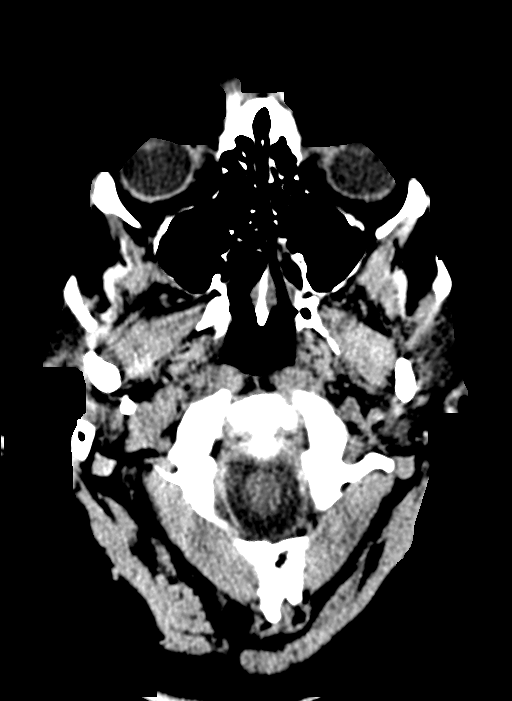
[im 6/58  bone]
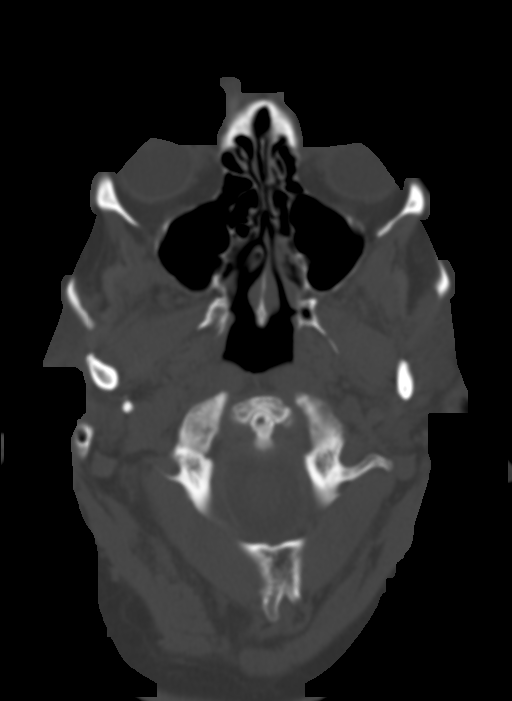
[im 11/58  brain]
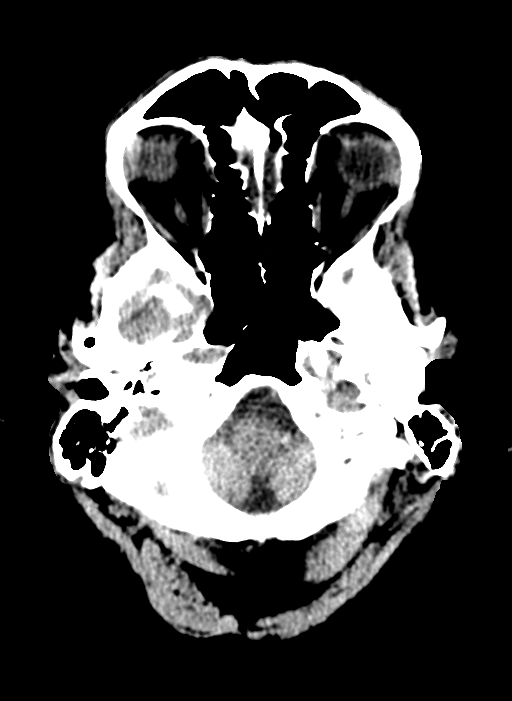
[im 16/58  brain]
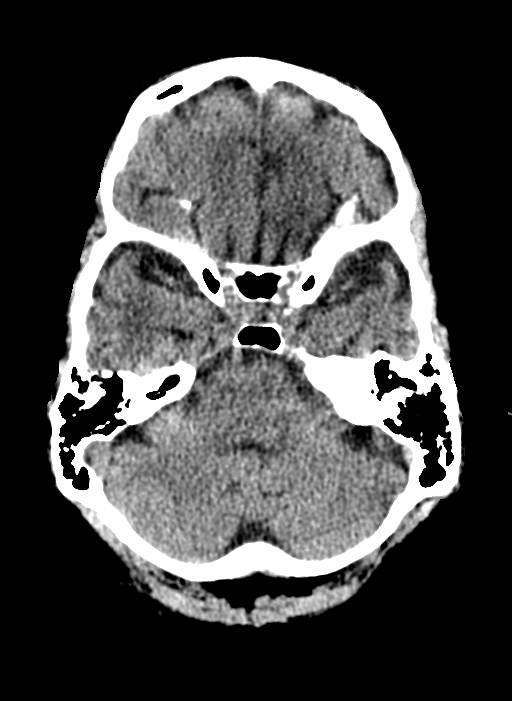
[im 21/58  brain]
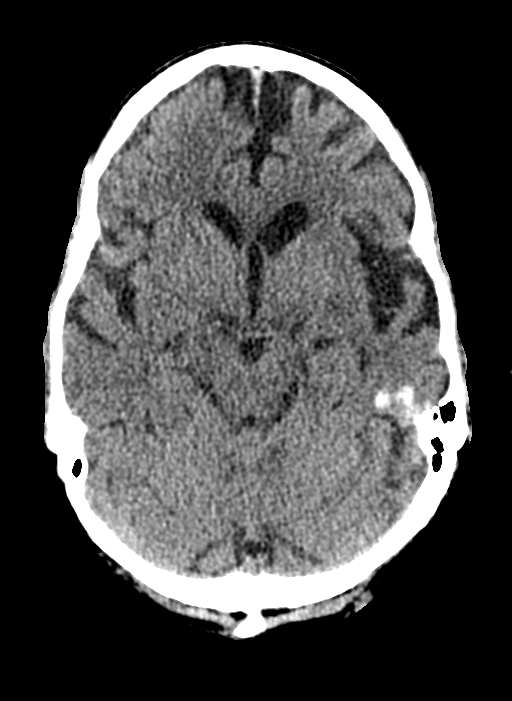
[im 32/58  brain]
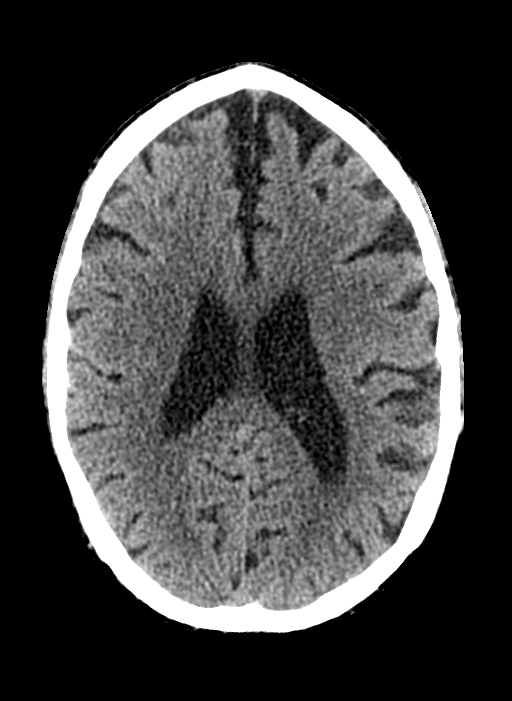
[im 32/58  bone]
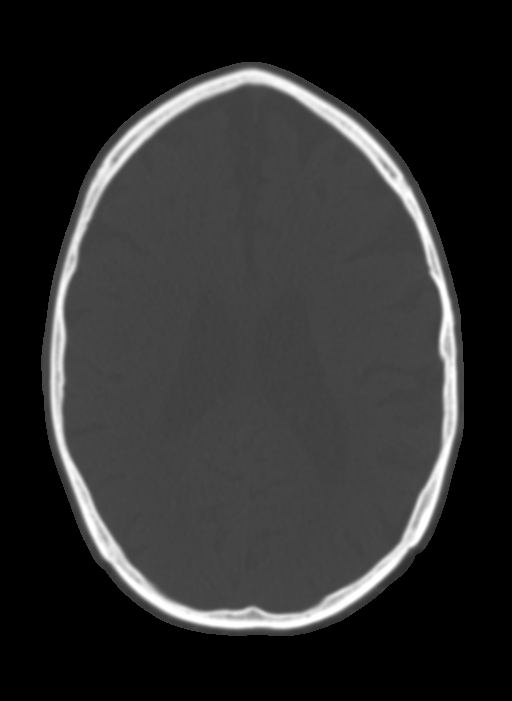
[im 37/58  brain]
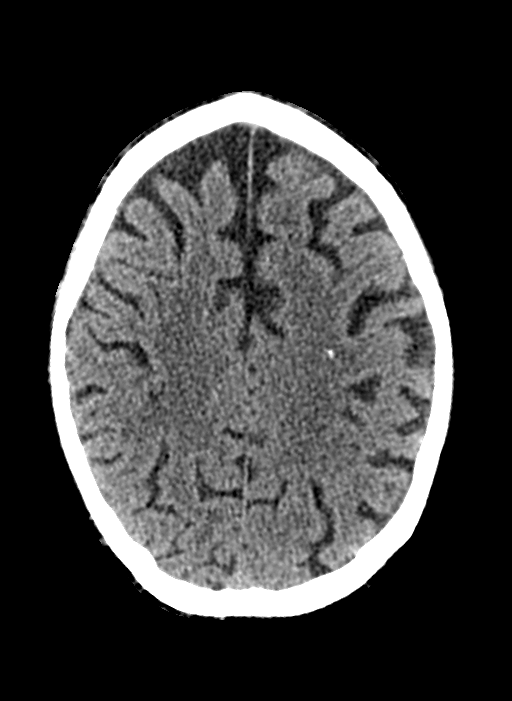
[im 42/58  brain]
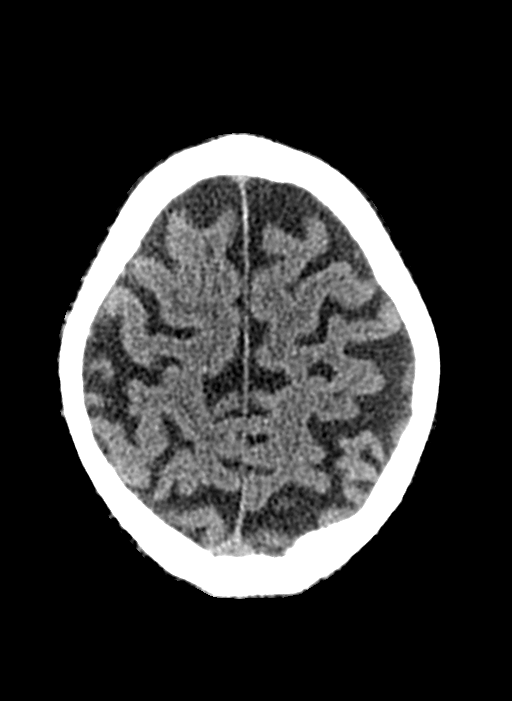
[im 47/58  brain]
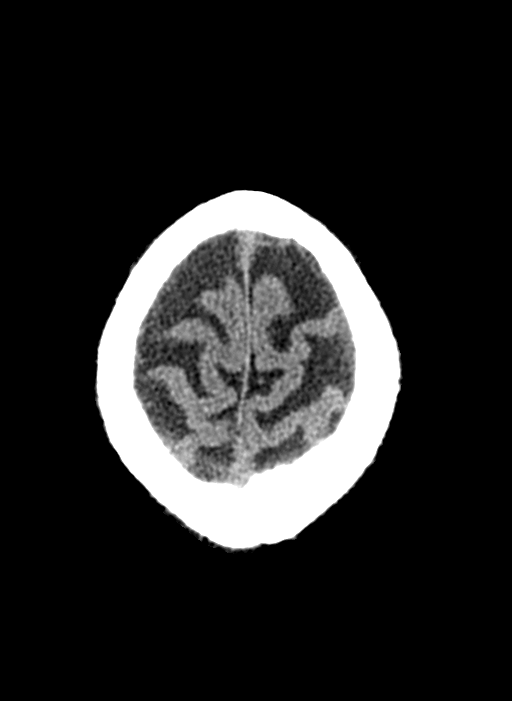
[im 52/58  brain]
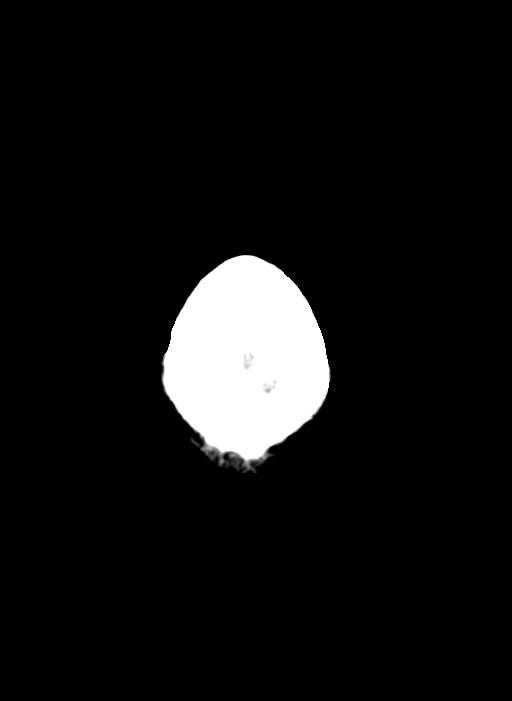
[im 52/58  bone]
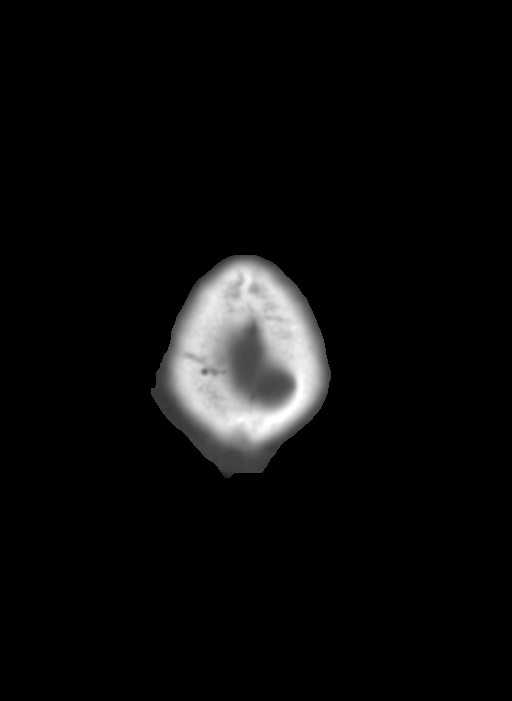

[12 of 47 positions shown; findings below may reference images not displayed]

FINDINGS: Brain: Normal anatomic configuration. Parenchymal volume loss is
commensurate with the patient's age. Mild periventricular white
matter changes are present likely reflecting the sequela of small
vessel ischemia. No abnormal intra or extra-axial mass lesion or
fluid collection. No abnormal mass effect or midline shift. No
evidence of acute intracranial hemorrhage or infarct. Ventricular
size is normal. Cerebellum unremarkable.

Vascular: No asymmetric hyperdense vasculature at the skull base.

Skull: Intact

Sinuses/Orbits: Paranasal sinuses are clear. Orbits are
unremarkable.

Other: Mastoid air cells and middle ear cavities are clear.
IMPRESSION: No acute intracranial abnormality.  Mild senescent change.

## 2024-11-07 DEATH — deceased
